# Patient Record
Sex: Female | Born: 1985 | Race: Black or African American | Hispanic: No | Marital: Single | Smoking: Never smoker
Health system: Southern US, Community
[De-identification: ages and names within clinical notes are randomized; demographics above are authoritative.]

## PROBLEM LIST (undated history)

## (undated) ENCOUNTER — Inpatient Hospital Stay (HOSPITAL_COMMUNITY): Payer: Self-pay

## (undated) DIAGNOSIS — Z8619 Personal history of other infectious and parasitic diseases: Secondary | ICD-10-CM

## (undated) DIAGNOSIS — I456 Pre-excitation syndrome: Secondary | ICD-10-CM

## (undated) DIAGNOSIS — D649 Anemia, unspecified: Secondary | ICD-10-CM

## (undated) DIAGNOSIS — N898 Other specified noninflammatory disorders of vagina: Secondary | ICD-10-CM

## (undated) DIAGNOSIS — R000 Tachycardia, unspecified: Secondary | ICD-10-CM

## (undated) DIAGNOSIS — T7840XA Allergy, unspecified, initial encounter: Secondary | ICD-10-CM

## (undated) DIAGNOSIS — F419 Anxiety disorder, unspecified: Secondary | ICD-10-CM

## (undated) DIAGNOSIS — I447 Left bundle-branch block, unspecified: Secondary | ICD-10-CM

## (undated) DIAGNOSIS — L309 Dermatitis, unspecified: Secondary | ICD-10-CM

## (undated) DIAGNOSIS — N760 Acute vaginitis: Secondary | ICD-10-CM

## (undated) DIAGNOSIS — K219 Gastro-esophageal reflux disease without esophagitis: Secondary | ICD-10-CM

## (undated) DIAGNOSIS — E119 Type 2 diabetes mellitus without complications: Secondary | ICD-10-CM

## (undated) DIAGNOSIS — R87629 Unspecified abnormal cytological findings in specimens from vagina: Secondary | ICD-10-CM

## (undated) DIAGNOSIS — E785 Hyperlipidemia, unspecified: Secondary | ICD-10-CM

## (undated) DIAGNOSIS — R51 Headache: Secondary | ICD-10-CM

## (undated) DIAGNOSIS — R0602 Shortness of breath: Secondary | ICD-10-CM

## (undated) DIAGNOSIS — B9689 Other specified bacterial agents as the cause of diseases classified elsewhere: Secondary | ICD-10-CM

## (undated) DIAGNOSIS — I499 Cardiac arrhythmia, unspecified: Secondary | ICD-10-CM

## (undated) DIAGNOSIS — R079 Chest pain, unspecified: Secondary | ICD-10-CM

## (undated) HISTORY — DX: Other specified noninflammatory disorders of vagina: N89.8

## (undated) HISTORY — DX: Personal history of other infectious and parasitic diseases: Z86.19

## (undated) HISTORY — DX: Gastro-esophageal reflux disease without esophagitis: K21.9

## (undated) HISTORY — DX: Acute vaginitis: N76.0

## (undated) HISTORY — DX: Dermatitis, unspecified: L30.9

## (undated) HISTORY — DX: Cardiac arrhythmia, unspecified: I49.9

## (undated) HISTORY — DX: Tachycardia, unspecified: R00.0

## (undated) HISTORY — DX: Type 2 diabetes mellitus without complications: E11.9

## (undated) HISTORY — DX: Anxiety disorder, unspecified: F41.9

## (undated) HISTORY — DX: Other specified bacterial agents as the cause of diseases classified elsewhere: B96.89

## (undated) HISTORY — DX: Pre-excitation syndrome: I45.6

## (undated) HISTORY — DX: Shortness of breath: R06.02

## (undated) HISTORY — DX: Unspecified abnormal cytological findings in specimens from vagina: R87.629

## (undated) HISTORY — DX: Allergy, unspecified, initial encounter: T78.40XA

## (undated) HISTORY — DX: Hyperlipidemia, unspecified: E78.5

## (undated) HISTORY — DX: Chest pain, unspecified: R07.9

## (undated) HISTORY — DX: Anemia, unspecified: D64.9

## (undated) HISTORY — DX: Left bundle-branch block, unspecified: I44.7

---

## 2005-05-03 ENCOUNTER — Emergency Department (HOSPITAL_COMMUNITY): Admission: EM | Admit: 2005-05-03 | Discharge: 2005-05-03 | Payer: Self-pay | Admitting: Emergency Medicine

## 2005-11-23 ENCOUNTER — Inpatient Hospital Stay (HOSPITAL_COMMUNITY): Admission: AD | Admit: 2005-11-23 | Discharge: 2005-11-26 | Payer: Self-pay | Admitting: Obstetrics and Gynecology

## 2007-08-05 ENCOUNTER — Other Ambulatory Visit: Admission: RE | Admit: 2007-08-05 | Discharge: 2007-08-05 | Payer: Self-pay | Admitting: Obstetrics and Gynecology

## 2007-11-12 ENCOUNTER — Emergency Department (HOSPITAL_COMMUNITY): Admission: EM | Admit: 2007-11-12 | Discharge: 2007-11-12 | Payer: Self-pay | Admitting: Emergency Medicine

## 2007-11-14 ENCOUNTER — Emergency Department (HOSPITAL_COMMUNITY): Admission: EM | Admit: 2007-11-14 | Discharge: 2007-11-14 | Payer: Self-pay | Admitting: Family Medicine

## 2007-11-25 ENCOUNTER — Inpatient Hospital Stay (HOSPITAL_COMMUNITY): Admission: AD | Admit: 2007-11-25 | Discharge: 2007-11-25 | Payer: Self-pay | Admitting: Obstetrics & Gynecology

## 2007-11-25 ENCOUNTER — Ambulatory Visit: Payer: Self-pay | Admitting: Family Medicine

## 2007-12-27 ENCOUNTER — Emergency Department (HOSPITAL_COMMUNITY): Admission: EM | Admit: 2007-12-27 | Discharge: 2007-12-27 | Payer: Self-pay | Admitting: Emergency Medicine

## 2008-01-15 ENCOUNTER — Inpatient Hospital Stay (HOSPITAL_COMMUNITY): Admission: AD | Admit: 2008-01-15 | Discharge: 2008-01-15 | Payer: Self-pay | Admitting: Obstetrics & Gynecology

## 2008-01-15 ENCOUNTER — Ambulatory Visit: Payer: Self-pay | Admitting: Obstetrics & Gynecology

## 2008-01-26 ENCOUNTER — Inpatient Hospital Stay (HOSPITAL_COMMUNITY): Admission: AD | Admit: 2008-01-26 | Discharge: 2008-01-29 | Payer: Self-pay | Admitting: Obstetrics and Gynecology

## 2008-03-10 ENCOUNTER — Other Ambulatory Visit: Admission: RE | Admit: 2008-03-10 | Discharge: 2008-03-10 | Payer: Self-pay | Admitting: Obstetrics & Gynecology

## 2010-09-24 NOTE — H&P (Signed)
NAMEALOISE, COPUS NO.:  [id number]   MEDICAL RECORD NO.:  [id number]          PATIENT TYPE:  INP   LOCATION:  9167                          FACILITY:  [facility]   PHYSICIAN:  Tilda Burrow, M.D. DATE OF BIRTH:  [birth year]   DATE OF ADMISSION:  01/26/2008  DATE OF DISCHARGE:                              HISTORY & PHYSICAL   ADMITTING DIAGNOSIS:  Pregnancy, 41 weeks 1 day, postdate pregnancy,  medically indicated induction of labor due to postdate cervical  variability.   HISTORY OF PRESENT ILLNESS:  This is a 25 year old female, gravida 2,  para 1-0-0-1, last menstrual period April 12, 2007, plus [facility] January 18, 2008, with 19-week ultrasound suggesting [facility] of January 11, 2008,  and 12-week ultrasound suggesting January 22, 2008.  She was admitted  at 41-1 by the best criteria for induction of labor.  Cervix was soft,  dilated now 3 cm, though it remains posterior, soft, -2 with vertex  presentation.  She has a the prior history of induction with 10-1/2-hour  labor.  Prenatal course has been followed at [facility] and is  notable for blood type AB positive, antibody screen negative, rubella  immune.   Hemoglobin 11, hematocrit 37.  Hepatitis C, HIV, RPR, GC, and Chlamydia  all negative.   Pap smear showed low-grade abnormalities, will be evaluated in 4 weeks.  MSAFP normal, group B strep negative, hemoglobin 31% at 28 weeks and  glucose tolerance test 92 mg percent, and Sickledex is negative.  She  plans on post partum tubal ligation 4 weeks postpartum.  Plans to bottle-  feed.  Takes her baby to [facility].   PHYSICAL EXAMINATION:  VITAL SIGNS:  Height 5 feet 6 inches, weight 170,  blood pressure 110/64.  HEENT:  Pupils equal, round, reactive to light.  NECK:  Supple.  GENERAL:  Alert and oriented x3.   Fundal length 30 cm down from 1 cm from the last week.  Cervix was 3-cm  long, -2 vertex, soft.   Consider favorable  plan.  Pitocin induction with labor with the  amniotomy once labor develops.  Scheduled for 9 p.m. tonight.  We will  move up to 5 p.m. if staffing and labor and delivery volume allows.      Tilda Burrow, M.D.  Electronically Signed    JVF/MEDQ  D:  01/26/2008  T:  01/27/2008  Job:  [id number]   cc:   [facility]   Francoise Schaumann. Milford Cage DO, FAAP  Fax: [phone]

## 2010-09-24 NOTE — Op Note (Signed)
NAMELELIA, JONS NO.:  [id number]   MEDICAL RECORD NO.:  [id number]          PATIENT TYPE:  INP   LOCATION:  9111                          FACILITY:  [facility]   PHYSICIAN:  Tilda Burrow, M.D. DATE OF BIRTH:  1985/12/21   DATE OF PROCEDURE:  DATE OF DISCHARGE:                               OPERATIVE REPORT   DELIVERY TIME:  4:56 a.m. on January 27, 2008.   SUMMARY:  Ms. Bamburg was admitted for induction at 9:45, with cervix 3  cm, 50% -2 vertex soft posterior.  Pitocin was initiated.  Amniotomy was  performed at midnight.  She progressed nicely in response to labor,  received epidural for analgesia and reached completely dilated shortly  before 5 a.m.  A brief second stage resulted in a spontaneous vertex  vaginal delivery from an OA position of a healthy female infant, Apgars  of 8 and 9, weight 7 pounds 9 ounces.  Placenta, cord blood, samples  were obtained, and placenta delivered intact.  Tomasa Blase presentation with  minimal blood loss of 200 mL.  Perineum was intact.  The patient  recovered in stable condition.  Delivery was attended by the patient's  mother and previous son stayed in the room.  The patient went to  recovery in stable condition with epidural removed and stable postpartum  status.      Tilda Burrow, M.D.  Electronically Signed     JVF/MEDQ  D:  01/27/2008  T:  01/27/2008  Job:  [id number]   cc:   Francoise Schaumann. Milford Cage DO, FAAP  Fax: [phone]

## 2010-09-27 NOTE — Op Note (Signed)
Candace [name], KINNAIRD NO.:  [id number]   MEDICAL RECORD NO.:  [id number]          PATIENT TYPE:  INP   LOCATION:  LDR1                          FACILITY:  [facility]   PHYSICIAN:  Tilda Burrow, M.D. DATE OF BIRTH:  [birth year]   DATE OF PROCEDURE:  11/24/2005  DATE OF DISCHARGE:                                  PROCEDURE NOTE   LENGTH OF FIRST STAGE OF LABOR:  7 hours and 10 minutes.   LENGTH OF FIRST STAGE OF LABOR:  1 hour and 28 minutes.   LENGTH OF THIRD STAGE OF LABOR:  4 minutes.   DELIVERY NOTE:  Date of delivery November 24, 2005 at 1738.  [name] had a  normal spontaneous vaginal delivery of a viable female infant with Apgars of  9/9.  Upon delivery of the head, there was a nuchal cord noted, which was  easily loosened, and the infant slipped through without any difficulty.  The  infant was suctioned.  Cord clamped and cut and to the mother's abdomen for  newborn care.  Upon inspection, the perineum was noted to be intact.  Cord  blood and cord blood gas as obtained.  Third stage of labor was actively  managed with 20 units of Pitocin, 1000 cc of D-5 lactated Ringers at a rapid  rate.  Placenta was delivered spontaneously via Tomasa Blase mechanism.  Membranes are noted to be intact.  There was a three vessel cord noted upon  inspection.  Estimated blood loss was approximately 350 cc.  Epidural  catheter was removed with flow tip intact.  The infant and mother were both  stabilized and transferred up to the postpartum unit in stable condition.      Zerita Boers, Lanier Clam      Tilda Burrow, M.D.  Electronically Signed    DL/MEDQ  D:  16/02/9603  T:  11/24/2005  Job:  [id number]   cc:   [facility]   Jeoffrey Massed, MD  Fax: [phone]

## 2010-09-27 NOTE — Op Note (Signed)
Candace [name], Candace [name] NO.:  [id number]   MEDICAL RECORD NO.:  [id number]          PATIENT TYPE:  INP   LOCATION:  LDR1                          FACILITY:  [facility]   PHYSICIAN:  Tilda Burrow, M.D. DATE OF BIRTH:  [birth year]   DATE OF PROCEDURE:  11/24/2005  DATE OF DISCHARGE:                                 OPERATIVE REPORT   PREOPERATIVE DIAGNOSES:  Epidural catheter placement.   POSTOPERATIVE DIAGNOSES:  Not given.   DESCRIPTION OF PROCEDURE:  The patient was prepped and draped in the sitting  position. The back was technically challenging to palpate sufficiently to  identify the epidural spaces due to very tight bony prominences with lots of  fatty tissue with edema overlying it. Nonetheless, we were able to at L2-3  interspace identify the interspace on the second attempt and at a depth of 8  cm were able to identify the epidural space using loss of resistance  technique. 5 mL of 1.5% lidocaine with epinephrine was infused followed by  insertion of the epidural catheter 2 cm into the epidural space with removal  of the Tuohy needle, taping of the catheter to the back and infusion at 14  mL/hour of 0.125% Marcaine solution with fentanyl. The patient tolerated the  procedure well and had good analgesic effect developed. Cervical exam post  procedure shows the cervix to be stretchable to 5 cm dilated.      Tilda Burrow, M.D.  Electronically Signed     JVF/MEDQ  D:  11/24/2005  T:  11/25/2005  Job:  [id number]

## 2010-09-27 NOTE — H&P (Signed)
[name], [name] NO.:  [id number]   MEDICAL RECORD NO.:  [id number]          PATIENT TYPE:  INP   LOCATION:  LDR1                          FACILITY:  [facility]   PHYSICIAN:  Tilda Burrow, M.D. DATE OF BIRTH:  20-Jan-1986   DATE OF ADMISSION:  11/23/2005  DATE OF DISCHARGE:  LH                                HISTORY & PHYSICAL   Patient is going to be admitted to the birthing center on the evening of  July 15 for induction of labor.   CHIEF COMPLAINT:  Induction of labor secondary to severe lower extremity  swelling.   HISTORY OF PRESENT ILLNESS:  Candace [name] is a 25 year old gravida 1, para 0  with an EDC of November 22, 2005 based on first trimester ultrasound.  Her  irregular period date was November 03, 2005, and her second trimester ultrasound  was July 9.  So, she is at least [redacted] weeks gestation.  She began prenatal  care in her second trimester and has had regular visits since then.   Prenatal labs include blood type AB+.  Rubella is immune.  HB, SAG, HIV,  RVR, and group B strep are negative.  She did have a positive Chlamydia on  October 17, 2005 with a subsequent negative proof of cure.  She was also  positive for HPV with an LSIL Pap smear.  She had a culpo done, and we are  going to recheck it three months after delivery.  Her sickle cell screen was  negative.  Her MSAFP was also negative.   PAST MEDICAL HISTORY:  Positive for anemia.   No surgical history.   No known drug allergies.   SOCIAL HISTORY:  She is single.  Lives with her mother.  She is in [employer].  She is estranged from the father of the baby.  She denies cigarette smoking,  alcohol, or drug use.   FAMILY HISTORY:  Positive for hypertension, diabetes, and cancer.   PHYSICAL EXAMINATION:  VITAL SIGNS:  Blood pressures have been 90s-130s/50s-  80s.  Total weight gain started off, she actually had a loss of 2 pounds up  until she was 35 weeks, where she weighed 166.  Then she lost 7 more  pounds  over the next couple of weeks and got down to 151; however, in the past two  weeks, she has actually gained 21 pounds.  Her fundal height growth has been  normal.  EXTREMITIES:  She has 3-4+ pitting edema all the way up past her knees.  This has just boomed in the past two weeks.  She is extremely uncomfortable.  There is no facial swelling or swelling in the upper extremities.  Reflexes  are 2+.  PELVIC:  Her cervix is 2 cm, 50% effaced, -2 station.  HEENT:  Within normal limits.  HEART:  Regular rate and rhythm.  LUNGS:  Clear.  ABDOMEN:  Soft and nontender.  Fundal height is 39 cm.   IMPRESSION:  Intrauterine pregnancy at 40 weeks.  Induction of labor  secondary to extreme swelling and maternal discomfort.   PLAN:  We will  admit to labor and delivery on the evening of Sunday, July  15th.  We will plan a Foley bulb preinduction and AROM and Pitocin in the  morning.      Jacklyn Shell, C.N.M.      Tilda Burrow, M.D.  Electronically Signed    FC/MEDQ  D:  11/21/2005  T:  11/21/2005  Job:  [id number]

## 2011-02-07 LAB — URINALYSIS, ROUTINE W REFLEX MICROSCOPIC
Glucose, UA: NEGATIVE
Ketones, ur: 15 — AB
Nitrite: NEGATIVE
Protein, ur: 30 — AB
Urobilinogen, UA: 0.2

## 2011-02-07 LAB — URINE MICROSCOPIC-ADD ON

## 2011-02-10 LAB — CBC
HCT: 28.8 — ABNORMAL LOW
Hemoglobin: 9.4 — ABNORMAL LOW
MCHC: 32.4
MCHC: 32.5
MCV: 76.8 — ABNORMAL LOW
MCV: 77.3 — ABNORMAL LOW
Platelets: 297
RDW: 16.2 — ABNORMAL HIGH
RDW: 16.4 — ABNORMAL HIGH

## 2011-07-25 LAB — LIPID PANEL
Cholesterol: 231 mg/dL — AB (ref 0–200)
HDL: 85 mg/dL — AB (ref 35–70)
LDL Cholesterol: 8 mg/dL
Triglycerides: 41 mg/dL (ref 40–160)

## 2011-09-10 ENCOUNTER — Ambulatory Visit (INDEPENDENT_AMBULATORY_CARE_PROVIDER_SITE_OTHER): Payer: BC Managed Care – PPO | Admitting: Family Medicine

## 2011-09-10 ENCOUNTER — Encounter: Payer: Self-pay | Admitting: Family Medicine

## 2011-09-10 VITALS — BP 110/80 | HR 81 | Resp 16 | Ht 62.25 in | Wt 184.0 lb

## 2011-09-10 DIAGNOSIS — L259 Unspecified contact dermatitis, unspecified cause: Secondary | ICD-10-CM

## 2011-09-10 DIAGNOSIS — E669 Obesity, unspecified: Secondary | ICD-10-CM

## 2011-09-10 DIAGNOSIS — L309 Dermatitis, unspecified: Secondary | ICD-10-CM

## 2011-09-10 DIAGNOSIS — J309 Allergic rhinitis, unspecified: Secondary | ICD-10-CM

## 2011-09-10 DIAGNOSIS — D509 Iron deficiency anemia, unspecified: Secondary | ICD-10-CM

## 2011-09-10 DIAGNOSIS — R21 Rash and other nonspecific skin eruption: Secondary | ICD-10-CM | POA: Insufficient documentation

## 2011-09-10 DIAGNOSIS — K5900 Constipation, unspecified: Secondary | ICD-10-CM | POA: Insufficient documentation

## 2011-09-10 DIAGNOSIS — J302 Other seasonal allergic rhinitis: Secondary | ICD-10-CM

## 2011-09-10 DIAGNOSIS — R259 Unspecified abnormal involuntary movements: Secondary | ICD-10-CM | POA: Insufficient documentation

## 2011-09-10 LAB — CBC WITH DIFFERENTIAL/PLATELET
Basophils Relative: 0 % (ref 0–1)
HCT: 37.2 % (ref 36.0–46.0)
Hemoglobin: 11.8 g/dL — ABNORMAL LOW (ref 12.0–15.0)
MCH: 26.9 pg (ref 26.0–34.0)
MCHC: 31.7 g/dL (ref 30.0–36.0)
Monocytes Absolute: 0.3 10*3/uL (ref 0.1–1.0)
Monocytes Relative: 7 % (ref 3–12)
Neutro Abs: 2.7 10*3/uL (ref 1.7–7.7)
WBC: 5 10*3/uL (ref 4.0–10.5)

## 2011-09-10 LAB — IRON AND TIBC: %SAT: 11 % — ABNORMAL LOW (ref 20–55)

## 2011-09-10 LAB — LDL CHOLESTEROL, DIRECT: Direct LDL: 112 mg/dL — ABNORMAL HIGH

## 2011-09-10 LAB — BASIC METABOLIC PANEL
BUN: 12 mg/dL (ref 6–23)
Glucose, Bld: 80 mg/dL (ref 70–99)
Potassium: 4.7 mEq/L (ref 3.5–5.3)

## 2011-09-10 MED ORDER — TRIAMCINOLONE ACETONIDE 0.5 % EX OINT: TOPICAL_OINTMENT | Freq: Two times a day (BID) | CUTANEOUS | Status: AC

## 2011-09-10 NOTE — Progress Notes (Signed)
  Subjective:    Patient ID: Candace [name], female    DOB: 24-May-1985, 26 y.o.   MRN: [id number]  HPI [phone]   -- CNA [facility] here to establish care. She has not had a primary care provider since she was an adolescent. She was previously followed by [facility] however has not been seen in 3 years. Pap smear overdue. She had health screening at her work and had a cholesterol panel done. The LDL was marked as 8 however I cannot confirm that this is only on screening sheet not the actual lab report.  Anemia-she states that she gets shaky in her hands right before she eats and she wanted to this because of her anemia also her fingertips or cold. She has not had her blood checked in some time but was severely anemic in the past and was on iron supplementation.  Rash-she has a history of eczema however has had a rash that comes and goes on the back of her hand since starting her  at [facility] for the past year. She uses over-the-counter hydrocortisone for the itch. She is asking for a Nitrol latex free gloves prescription that are working order. She sure that this is coming from the vinyl gloves that they have at work.  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, but has no visual problems  Review of Systems   GEN- denies fatigue, fever, weight loss,weakness, recent illness HEENT- denies eye drainage, change in vision, nasal discharge, CVS- denies chest pain, palpitations RESP- denies SOB, cough, wheeze ABD- denies N/V, +constipation, abd pain GU- denies dysuria, hematuria, dribbling, incontinence MSK- denies joint pain, muscle aches, injury Neuro- denies headache, dizziness, syncope, seizure activity, occ shaking in hands before eating      Objective:   Physical Exam GEN- NAD, alert and oriented x3, obese HEENT- PERRL, EOMI, non injected sclera, pink conjunctiva, MMM, oropharynx clear Neck- Supple, no thyrmegaly CVS- RRR, no murmur ABD-NABS,soft,  NT,ND RESP-CTAB EXT- No edema Pulses- Radial, DP- 2+ Neuro- no focal deficits Skin- hyperpigmented raised fine maculopaular rash on bialt hands, palms sparred, hyperpigementation over face on chin and corners or mouth and antecubital fossa       Assessment & Plan:

## 2011-09-10 NOTE — Assessment & Plan Note (Signed)
Her hand shaking before meals is very interesting, she does eat small snacks as can not eat breakfast before work because of nausea at 530am, ? Low blood sugar, fatigue, no focal deficits

## 2011-09-10 NOTE — Patient Instructions (Addendum)
Use the steroid cream for your hands  Get the labs done- we will call with results  Work on your weight- cut back on snack food and junk food Exercise 30 minutes 5 days a week  F/U in 4 weeks for Physical for PAP Smear

## 2011-09-10 NOTE — Assessment & Plan Note (Signed)
Check iron panel and CBC. ?

## 2011-09-10 NOTE — Assessment & Plan Note (Signed)
Pt has constipation on and off, she manages this with what she eats, no meds currently,advised fruits and veggies for fiber

## 2011-09-10 NOTE — Assessment & Plan Note (Signed)
OTC meds, well controlled

## 2011-09-10 NOTE — Assessment & Plan Note (Signed)
She does not appear to be concerned with her weight, she states when she was down to 145lbs she was too skinny. Advised regular exercise and change in diet

## 2011-10-17 ENCOUNTER — Ambulatory Visit (INDEPENDENT_AMBULATORY_CARE_PROVIDER_SITE_OTHER): Payer: BC Managed Care – PPO | Admitting: Family Medicine

## 2011-10-17 ENCOUNTER — Other Ambulatory Visit (HOSPITAL_COMMUNITY)
Admission: RE | Admit: 2011-10-17 | Discharge: 2011-10-17 | Disposition: A | Discharge status: Home / Self Care | Payer: BC Managed Care – PPO | Source: Ambulatory Visit | Attending: Family Medicine | Admitting: Family Medicine

## 2011-10-17 ENCOUNTER — Encounter: Payer: Self-pay | Admitting: Family Medicine

## 2011-10-17 VITALS — BP 102/70 | HR 72 | Resp 16 | Ht 62.5 in | Wt 175.0 lb

## 2011-10-17 DIAGNOSIS — Z01419 Encounter for gynecological examination (general) (routine) without abnormal findings: Secondary | ICD-10-CM | POA: Insufficient documentation

## 2011-10-17 DIAGNOSIS — Z23 Encounter for immunization: Secondary | ICD-10-CM

## 2011-10-17 DIAGNOSIS — Z0000 Encounter for general adult medical examination without abnormal findings: Secondary | ICD-10-CM

## 2011-10-17 DIAGNOSIS — E669 Obesity, unspecified: Secondary | ICD-10-CM

## 2011-10-17 DIAGNOSIS — E785 Hyperlipidemia, unspecified: Secondary | ICD-10-CM | POA: Insufficient documentation

## 2011-10-17 NOTE — Progress Notes (Signed)
Addended by: Kandis Fantasia B on: 10/17/2011 08:49 AM   Modules accepted: Orders

## 2011-10-17 NOTE — Patient Instructions (Signed)
Dr.Civil- [phone] (Dentist) I recommend dental visit every 6 months I recommend eye visit yearly Limit fast food and fried foods, increase activity 30 minutes 5 days a week We will recheck your cholesterol in 1 year Letter to be sent with PAP Smear results TDAP given I recommend Multivitamin with iron F/U 1 year for physical, otherwise as needed

## 2011-10-17 NOTE — Progress Notes (Signed)
  Subjective:    Patient ID: Candace [name], female    DOB: 08-22-1985, 26 y.o.   MRN: [id number]  HPI  Pt here for GYN exam No concerns Due for TDAP Labs reviewed with pt LMP- 1 week ago, no contraception  Review of Systems  GEN- denies fatigue, fever, weight loss,weakness, recent illness HEENT- denies eye drainage, change in vision, nasal discharge, CVS- denies chest pain, palpitations RESP- denies SOB, cough, wheeze ABD- denies N/V, change in stools, abd pain GU- denies dysuria, hematuria, dribbling, incontinence MSK- denies joint pain, muscle aches, injury Neuro- denies headache, dizziness, syncope, seizure activity    Objective:   Physical Exam GEN- NAD, alert and oriented x3, obese HEENT- PERRL, EOMI, non injected sclera, pink conjunctiva, MMM, oropharynx clear Neck- Supple, no thyromegaly CVS- RRR, no murmur RESP-CTAB ABD-NABS,soft, NT,ND Breast- normal symmetry, no nipple inversion,no nipple drainage, no nodules or lumps felt Nodes- no axillary nodes GU- normal external genitalia, vaginal mucosa pink and moist, cervix visualized no growth, no blood form os, minimal thin clear discharge, no CMT, no ovarian masses, uterus normal size EXT- No edema Pulses- Radial, DP- 2+        Assessment & Plan:   CPE- PAP Smear done, passed vision screen, TDAP given, labs discussed  Obesity- discussed avoiding fast foods, fried foods, increase activity

## 2011-10-17 NOTE — Assessment & Plan Note (Signed)
Will monitor, discussed diet, no meds needed

## 2011-10-23 MED ORDER — FLUCONAZOLE 150 MG PO TABS: 150.0000 mg | ORAL_TABLET | Freq: Once | ORAL | Status: AC

## 2011-10-23 NOTE — Addendum Note (Signed)
Addended by: Milinda Antis F on: 10/23/2011 01:21 PM   Modules accepted: Orders

## 2011-10-23 NOTE — Progress Notes (Signed)
  Subjective:    Patient ID: Candace [name], female    DOB: 1985-12-29, 26 y.o.   MRN: [id number]  HPI    Review of Systems     Objective:   Physical Exam        Assessment & Plan:  PAP Smear normal, yeast seen will send diflucan

## 2012-04-05 ENCOUNTER — Encounter: Payer: Self-pay | Admitting: Family Medicine

## 2012-04-05 ENCOUNTER — Telehealth: Payer: Self-pay | Admitting: Family Medicine

## 2012-04-05 ENCOUNTER — Ambulatory Visit (INDEPENDENT_AMBULATORY_CARE_PROVIDER_SITE_OTHER): Payer: BC Managed Care – PPO | Admitting: Family Medicine

## 2012-04-05 VITALS — BP 120/80 | HR 62 | Resp 15 | Ht 62.5 in | Wt 169.0 lb

## 2012-04-05 DIAGNOSIS — R634 Abnormal weight loss: Secondary | ICD-10-CM

## 2012-04-05 DIAGNOSIS — R259 Unspecified abnormal involuntary movements: Secondary | ICD-10-CM

## 2012-04-05 DIAGNOSIS — R7309 Other abnormal glucose: Secondary | ICD-10-CM

## 2012-04-05 DIAGNOSIS — E162 Hypoglycemia, unspecified: Secondary | ICD-10-CM | POA: Insufficient documentation

## 2012-04-05 LAB — HEPATIC FUNCTION PANEL
Albumin: 4.8 g/dL (ref 3.5–5.2)
Total Bilirubin: 1.2 mg/dL (ref 0.3–1.2)
Total Protein: 6.9 g/dL (ref 6.0–8.3)

## 2012-04-05 LAB — BASIC METABOLIC PANEL
BUN: 10 mg/dL (ref 6–23)
Creat: 0.77 mg/dL (ref 0.50–1.10)

## 2012-04-05 LAB — CBC
MCH: 27.6 pg (ref 26.0–34.0)
MCHC: 33.2 g/dL (ref 30.0–36.0)
Platelets: 326 10*3/uL (ref 150–400)
RDW: 14.1 % (ref 11.5–15.5)

## 2012-04-05 LAB — GLUCOSE, POCT (MANUAL RESULT ENTRY): POC Glucose: 77 mg/dl (ref 70–99)

## 2012-04-05 LAB — HEMOGLOBIN A1C: Hgb A1c MFr Bld: 5.6 % (ref <5.7)

## 2012-04-05 LAB — HIV ANTIBODY (ROUTINE TESTING W REFLEX): HIV: NONREACTIVE

## 2012-04-05 NOTE — Telephone Encounter (Signed)
Patient states that when she got home she still felt like she was having tremors and checked her blood sugar it was 104 fasting after being 77 in the office fasting.  Would like to know is this normal and should she be concerned.

## 2012-04-05 NOTE — Assessment & Plan Note (Signed)
She's lost approximately 30 pounds in the past 5 months initially she was exercising however the past 2 months she has not been exercising continues to wear stressed the canal to wait. Start workup per above. Her Pap smear was normal. Labs to be done. She is a nonsmoker.  Check thyroid

## 2012-04-05 NOTE — Progress Notes (Signed)
  Subjective:    Patient ID: Candace [name], female    DOB: 07/21/1985, 26 y.o.   MRN: [id number]  HPI Patient presents with tremors for the past few weeks which have increased in nature. She knows that she has a tremor when she has low blood sugars are she has not eaten. After she eats a tremor resolves itself. She was evaluated for this in the past however nothing was found at that time it was short-lived. Now she is currently daily and is noted at her work. She feels weak when this occurs. She has not had any syncope or seizure activity. She has a family history of diabetes mellitus. No current medication. Denies etoh use. She states she has lost  30lb over the past 5 months, initially trying to loose with exercise past 2 months weight continues to come off with no exercise or change in diet She has not had any food this AM  Review of Systems - per above   GEN- denies fatigue, fever, weight loss,weakness, recent illness HEENT- denies eye drainage, change in vision, nasal discharge, CVS- denies chest pain, palpitations RESP- denies SOB, cough, wheeze ABD- denies N/V, change in stools, abd pain GU- denies dysuria, hematuria, dribbling, incontinence MSK- denies joint pain, muscle aches, injury Neuro- denies headache, dizziness, syncope, seizure activity, + tremor     Objective:   Physical Exam GEN- NAD, alert and oriented x3 HEENT- PERRL, EOMI, non injected sclera, pink conjunctiva, MMM, oropharynx clear Neck- Supple, no thryomegaly CVS- RRR, no murmur RESP-CTAB EXT- No edema Pulses- Radial, DP- 2+ Neuro- CNII-XII grossly in tact, + tremor with outstreched hands, DTR symmetric UE/LE  Psych- normal affect and mood      Assessment & Plan:

## 2012-04-05 NOTE — Assessment & Plan Note (Signed)
She's had recurrence of symptoms. Labs will be obtained. She's to keep small snack with her at all times. If these things are negative and I will have her see neurology.

## 2012-04-05 NOTE — Addendum Note (Signed)
Addended by: Milinda Antis F on: 04/05/2012 10:51 PM   Modules accepted: Orders

## 2012-04-05 NOTE — Patient Instructions (Signed)
Get the labs done this morning Keep glucose tabs with you or small snacks Take your blood sugar fasting and then when you have symptoms  You may need referral to neurologist

## 2012-04-05 NOTE — Assessment & Plan Note (Addendum)
Her symptoms are concerning for hypoglycemia and she improves after eating or drinking. I will send her for electrolytes will also check her TSH with a tremor. I will also check an A1c Given meter in office. We'll see what her blood sugars are during these episodes. She would check these for the next 2 weeks

## 2012-04-05 NOTE — Telephone Encounter (Signed)
Patient aware.

## 2012-04-05 NOTE — Telephone Encounter (Signed)
This is okay, make sure she eats at regular intervals, We will call back with the labs

## 2012-04-27 ENCOUNTER — Telehealth: Payer: Self-pay | Admitting: Family Medicine

## 2012-04-28 NOTE — Telephone Encounter (Signed)
Patient is aware 

## 2012-09-21 ENCOUNTER — Ambulatory Visit (INDEPENDENT_AMBULATORY_CARE_PROVIDER_SITE_OTHER): Payer: BC Managed Care – PPO | Admitting: Family Medicine

## 2012-09-21 ENCOUNTER — Other Ambulatory Visit: Payer: Self-pay | Admitting: Obstetrics & Gynecology

## 2012-09-21 ENCOUNTER — Encounter: Payer: Self-pay | Admitting: Family Medicine

## 2012-09-21 VITALS — BP 120/80 | HR 114 | Resp 16 | Wt 196.8 lb

## 2012-09-21 DIAGNOSIS — Z3201 Encounter for pregnancy test, result positive: Secondary | ICD-10-CM

## 2012-09-21 DIAGNOSIS — O3680X0 Pregnancy with inconclusive fetal viability, not applicable or unspecified: Secondary | ICD-10-CM

## 2012-09-21 DIAGNOSIS — N912 Amenorrhea, unspecified: Secondary | ICD-10-CM

## 2012-09-21 NOTE — Patient Instructions (Signed)
Start Flinestone Vitamin - take 2 of the complete vitamins Referral to Dr. Emelda Fear

## 2012-09-22 ENCOUNTER — Encounter: Payer: Self-pay | Admitting: *Deleted

## 2012-09-22 ENCOUNTER — Encounter: Payer: Self-pay | Admitting: Family Medicine

## 2012-09-22 DIAGNOSIS — Z3201 Encounter for pregnancy test, result positive: Secondary | ICD-10-CM | POA: Insufficient documentation

## 2012-09-22 LAB — HCG, QUANTITATIVE, PREGNANCY: hCG, Beta Chain, Quant, S: 7684.1 m[IU]/mL

## 2012-09-22 NOTE — Progress Notes (Signed)
  Subjective:    Patient ID: Candace [name], female    DOB: Sep 18, 1985, 27 y.o.   MRN: [id number]  HPI  Pt here with positive pregnancy test a few weeks, ago, repeated and was negative. LMP 1st week of April, has gained weight, very hungry. She has this before where she had different pregnancy test and was 4 months pregnant.  Review of Systems  GEN- denies fatigue, fever, weight loss,weakness, recent illness HEENT- denies eye drainage, change in vision, nasal discharge, CVS- denies chest pain, palpitations RESP- denies SOB, cough, wheeze ABD- denies N/V, change in stools, abd pain GU- denies dysuria, hematuria, dribbling, incontinence MSK- denies joint pain, muscle aches, injury Neuro- denies headache, dizziness, syncope, seizure activity      Objective:   Physical Exam GEN- NAD, alert and oriented x3 CVS- mild tachycardia, no murmur RESP-CTAB ABD-NABS,soft,NT,ND EXT- No edema Pulses- Radial 2+        Assessment & Plan:

## 2012-09-22 NOTE — Assessment & Plan Note (Signed)
Beta HCG conforms Start flinestones, unable to tolerate prenatal vitamins in the past Referral to [facility],

## 2012-09-23 ENCOUNTER — Ambulatory Visit (INDEPENDENT_AMBULATORY_CARE_PROVIDER_SITE_OTHER): Payer: BC Managed Care – PPO

## 2012-09-23 DIAGNOSIS — O3680X0 Pregnancy with inconclusive fetal viability, not applicable or unspecified: Secondary | ICD-10-CM

## 2012-09-23 NOTE — Progress Notes (Signed)
U/S-transvaginal u/s performed-single intrauterine gestational sac noted, GS meas c/w 5+5wks, no yolk sac or fetal pole noted,cx appears closed, C.L. Noted on RT, no free fluid noted

## 2012-09-24 ENCOUNTER — Other Ambulatory Visit: Payer: Self-pay | Admitting: Obstetrics & Gynecology

## 2012-09-24 DIAGNOSIS — O3680X0 Pregnancy with inconclusive fetal viability, not applicable or unspecified: Secondary | ICD-10-CM

## 2012-09-28 LAB — US OB TRANSVAGINAL

## 2012-10-05 ENCOUNTER — Ambulatory Visit (INDEPENDENT_AMBULATORY_CARE_PROVIDER_SITE_OTHER): Payer: BC Managed Care – PPO

## 2012-10-05 ENCOUNTER — Other Ambulatory Visit: Payer: Self-pay | Admitting: Obstetrics & Gynecology

## 2012-10-05 ENCOUNTER — Encounter: Payer: Self-pay | Admitting: Obstetrics & Gynecology

## 2012-10-05 ENCOUNTER — Ambulatory Visit (INDEPENDENT_AMBULATORY_CARE_PROVIDER_SITE_OTHER): Payer: BC Managed Care – PPO | Admitting: Obstetrics & Gynecology

## 2012-10-05 VITALS — BP 110/70 | Wt 196.0 lb

## 2012-10-05 DIAGNOSIS — Z331 Pregnant state, incidental: Secondary | ICD-10-CM

## 2012-10-05 DIAGNOSIS — O3680X0 Pregnancy with inconclusive fetal viability, not applicable or unspecified: Secondary | ICD-10-CM

## 2012-10-05 DIAGNOSIS — O210 Mild hyperemesis gravidarum: Secondary | ICD-10-CM

## 2012-10-05 DIAGNOSIS — Z1389 Encounter for screening for other disorder: Secondary | ICD-10-CM

## 2012-10-05 LAB — POCT URINALYSIS DIPSTICK
Glucose, UA: NEGATIVE
Leukocytes, UA: NEGATIVE
Nitrite, UA: NEGATIVE

## 2012-10-05 NOTE — Progress Notes (Signed)
U/S-transabdominal u/s performed, single IUP with +FCA noted, FHR=147BPM, cx long and closed, bilateral adnexa wnl, EDD 05/25/2013

## 2012-10-05 NOTE — Progress Notes (Signed)
Sonogram reviewed No bleeding, nauseated but not vomiting First ob appointment

## 2012-10-06 LAB — US OB COMP LESS 14 WKS

## 2012-10-07 ENCOUNTER — Telehealth: Payer: Self-pay | Admitting: Advanced Practice Midwife

## 2012-10-07 NOTE — Telephone Encounter (Signed)
Pt requesting Zofran or Diclegis prescribed for N/V. Pt states does not want phenergan makes her sleepy.

## 2012-10-08 MED ORDER — ONDANSETRON HCL 8 MG PO TABS: 8.0000 mg | ORAL_TABLET | Freq: Two times a day (BID) | ORAL | Status: DC | PRN

## 2012-10-08 NOTE — Telephone Encounter (Signed)
Left message i called zofran in

## 2012-10-19 ENCOUNTER — Ambulatory Visit (INDEPENDENT_AMBULATORY_CARE_PROVIDER_SITE_OTHER): Payer: BC Managed Care – PPO | Admitting: Women's Health

## 2012-10-19 ENCOUNTER — Encounter: Payer: Self-pay | Admitting: Women's Health

## 2012-10-19 ENCOUNTER — Other Ambulatory Visit: Payer: Self-pay | Admitting: *Deleted

## 2012-10-19 VITALS — BP 120/80 | Wt 188.6 lb

## 2012-10-19 DIAGNOSIS — O210 Mild hyperemesis gravidarum: Secondary | ICD-10-CM

## 2012-10-19 DIAGNOSIS — Z3481 Encounter for supervision of other normal pregnancy, first trimester: Secondary | ICD-10-CM

## 2012-10-19 DIAGNOSIS — Z1389 Encounter for screening for other disorder: Secondary | ICD-10-CM

## 2012-10-19 DIAGNOSIS — O219 Vomiting of pregnancy, unspecified: Secondary | ICD-10-CM

## 2012-10-19 DIAGNOSIS — Z3480 Encounter for supervision of other normal pregnancy, unspecified trimester: Secondary | ICD-10-CM | POA: Insufficient documentation

## 2012-10-19 DIAGNOSIS — Z331 Pregnant state, incidental: Secondary | ICD-10-CM

## 2012-10-19 LAB — URINALYSIS
Glucose, UA: 100 mg/dL — AB
Hgb urine dipstick: NEGATIVE
Ketones, ur: 15 mg/dL — AB
Protein, ur: NEGATIVE mg/dL
Urobilinogen, UA: 0.2 mg/dL (ref 0.0–1.0)

## 2012-10-19 LAB — POCT URINALYSIS DIPSTICK
Blood, UA: NEGATIVE
Glucose, UA: NEGATIVE
Nitrite, UA: NEGATIVE

## 2012-10-19 LAB — CBC
HCT: 36.7 % (ref 36.0–46.0)
Hemoglobin: 12.3 g/dL (ref 12.0–15.0)
MCH: 27.2 pg (ref 26.0–34.0)
MCHC: 33.5 g/dL (ref 30.0–36.0)
RBC: 4.52 MIL/uL (ref 3.87–5.11)

## 2012-10-19 LAB — HIV ANTIBODY (ROUTINE TESTING W REFLEX): HIV: NONREACTIVE

## 2012-10-19 MED ORDER — DOXYLAMINE-PYRIDOXINE 10-10 MG PO TBEC: 10.0000 mg | DELAYED_RELEASE_TABLET | ORAL | Status: DC

## 2012-10-19 NOTE — Progress Notes (Signed)
Cramping off and on. Constipated. New OB packet given. Consents signed. Pt had a pap smear last year. To sign release for [place] to get results.

## 2012-10-19 NOTE — Patient Instructions (Signed)
Constipation  Drink plenty of fluid, preferably water, throughout the day  Eat foods high in fiber such as fruits, vegetables, and grains  Exercise, such as walking, is a good way to keep your bowels regular  Drink warm fluids, especially warm prune juice, or decaf coffee  Eat a 1/2 cup of real oatmeal (not instant), 1/2 cup applesauce, and 1/2-1 cup warm prune juice every day  If needed, you may take Colace (docusate sodium) stool softener once or twice a day to help keep the stool soft. If you are pregnant, wait until you are out of your first trimester (12-14 weeks of pregnancy)  If you still are having problems with constipation, you may take Miralax once daily as needed to help keep your bowels regular.  If you are pregnant, wait until you are out of your first trimester (12-14 weeks of pregnancy)   Nausea & Vomiting  Have saltine crackers or pretzels by your bed and eat a few bites before you raise your head out of bed in the morning  Eat small frequent meals throughout the day instead of large meals  Drink plenty of fluids throughout the day to stay hydrated, just don't drink a lot of fluids with your meals.  This can make your stomach fill up faster making you feel sick  Do not brush your teeth right after you eat  Products with real ginger are good for nausea, like ginger ale and ginger hard candy Make sure it says made with real ginger!  Sucking on sour candy like lemon heads is also good for nausea  If your prenatal vitamins make you nauseated, take them at night so you will sleep through the nausea  If you feel like you need medicine for the nausea & vomiting please let us know  If you are unable to keep any fluids or food down please let us know    Pregnancy - First Trimester During sexual intercourse, millions of sperm go into the vagina. Only 1 sperm will penetrate and fertilize the female egg while it is in the Fallopian tube. One week later, the fertilized egg  implants into the wall of the uterus. An embryo begins to develop into a baby. At 6 to 8 weeks, the eyes and face are formed and the heartbeat can be seen on ultrasound. At the end of 12 weeks (first trimester), all the baby's organs are formed. Now that you are pregnant, you will want to do everything you can to have a healthy baby. Two of the most important things are to get good prenatal care and follow your caregiver's instructions. Prenatal care is all the medical care you receive before the baby's birth. It is given to prevent, find, and treat problems during the pregnancy and childbirth. PRENATAL EXAMS  During prenatal visits, your weight, blood pressure, and urine are checked. This is done to make sure you are healthy and progressing normally during the pregnancy.  A pregnant woman should gain 25 to 35 pounds during the pregnancy. However, if you are overweight or underweight, your caregiver will advise you regarding your weight.  Your caregiver will ask and answer questions for you.  Blood work, cervical cultures, other necessary tests, and a Pap test are done during your prenatal exams. These tests are done to check on your health and the probable health of your baby. Tests are strongly recommended and done for HIV with your permission. This is the virus that causes AIDS. These tests are done because medicines  can be given to help prevent your baby from being born with this infection should you have been infected without knowing it. Blood work is also used to find out your blood type, previous infections, and follow your blood levels (hemoglobin).  Low hemoglobin (anemia) is common during pregnancy. Iron and vitamins are given to help prevent this. Later in the pregnancy, blood tests for diabetes will be done along with any other tests if any problems develop.  You may need other tests to make sure you and the baby are doing well. CHANGES DURING THE FIRST TRIMESTER  Your body goes through  many changes during pregnancy. They vary from person to person. Talk to your caregiver about changes you notice and are concerned about. Changes can include:  Your menstrual period stops.  The egg and sperm carry the genes that determine what you look like. Genes from you and your partner are forming a baby. The female genes determine whether the baby is a boy or a girl.  Your body increases in girth and you may feel bloated.  Feeling sick to your stomach (nauseous) and throwing up (vomiting). If the vomiting is uncontrollable, call your caregiver.  Your breasts will begin to enlarge and become tender.  Your nipples may stick out more and become darker.  The need to urinate more. Painful urination may mean you have a bladder infection.  Tiring easily.  Loss of appetite.  Cravings for certain kinds of food.  At first, you may gain or lose a couple of pounds.  You may have changes in your emotions from day to day (excited to be pregnant or concerned something may go wrong with the pregnancy and baby).  You may have more vivid and strange dreams. HOME CARE INSTRUCTIONS   It is very important to avoid all smoking, alcohol and non-prescribed drugs during your pregnancy. These affect the formation and growth of the baby. Avoid chemicals while pregnant to ensure the delivery of a healthy infant.  Start your prenatal visits by the 12th week of pregnancy. They are usually scheduled monthly at first, then more often in the last 2 months before delivery. Keep your caregiver's appointments. Follow your caregiver's instructions regarding medicine use, blood and lab tests, exercise, and diet.  During pregnancy, you are providing food for you and your baby. Eat regular, well-balanced meals. Choose foods such as meat, fish, milk and other low fat dairy products, vegetables, fruits, and whole-grain breads and cereals. Your caregiver will tell you of the ideal weight gain.  You can help morning  sickness by keeping soda crackers at the bedside. Eat a couple before arising in the morning. You may want to use the crackers without salt on them.  Eating 4 to 5 small meals rather than 3 large meals a day also may help the nausea and vomiting.  Drinking liquids between meals instead of during meals also seems to help nausea and vomiting.  A physical sexual relationship may be continued throughout pregnancy if there are no other problems. Problems may be early (premature) leaking of amniotic fluid from the membranes, vaginal bleeding, or belly (abdominal) pain.  Exercise regularly if there are no restrictions. Check with your caregiver or physical therapist if you are unsure of the safety of some of your exercises. Greater weight gain will occur in the last 2 trimesters of pregnancy. Exercising will help:  Control your weight.  Keep you in shape.  Prepare you for labor and delivery.  Help you lose your pregnancy   weight after you deliver your baby.  Wear a good support or jogging bra for breast tenderness during pregnancy. This may help if worn during sleep too.  Ask when prenatal classes are available. Begin classes when they are offered.  Do not use hot tubs, steam rooms, or saunas.  Wear your seat belt when driving. This protects you and your baby if you are in an accident.  Avoid raw meat, uncooked cheese, cat litter boxes, and soil used by cats throughout the pregnancy. These carry germs that can cause birth defects in the baby.  The first trimester is a good time to visit your dentist for your dental health. Getting your teeth cleaned is okay. Use a softer toothbrush and brush gently during pregnancy.  Ask for help if you have financial, counseling, or nutritional needs during pregnancy. Your caregiver will be able to offer counseling for these needs as well as refer you for other special needs.  Do not take any medicines or herbs unless told by your caregiver.  Inform your  caregiver if there is any mental or physical domestic violence.  Make a list of emergency phone numbers of family, friends, hospital, and police and fire departments.  Write down your questions. Take them to your prenatal visit.  Do not douche.  Do not cross your legs.  If you have to stand for long periods of time, rotate you feet or take small steps in a circle.  You may have more vaginal secretions that may require a sanitary pad. Do not use tampons or scented sanitary pads. MEDICINES AND DRUG USE IN PREGNANCY  Take prenatal vitamins as directed. The vitamin should contain 1 milligram of folic acid. Keep all vitamins out of reach of children. Only a couple vitamins or tablets containing iron may be fatal to a baby or young child when ingested.  Avoid use of all medicines, including herbs, over-the-counter medicines, not prescribed or suggested by your caregiver. Only take over-the-counter or prescription medicines for pain, discomfort, or fever as directed by your caregiver. Do not use aspirin, ibuprofen, or naproxen unless directed by your caregiver.  Let your caregiver also know about herbs you may be using.  Alcohol is related to a number of birth defects. This includes fetal alcohol syndrome. All alcohol, in any form, should be avoided completely. Smoking will cause low birth rate and premature babies.  Street or illegal drugs are very harmful to the baby. They are absolutely forbidden. A baby born to an addicted mother will be addicted at birth. The baby will go through the same withdrawal an adult does.  Let your caregiver know about any medicines that you have to take and for what reason you take them. SEEK MEDICAL CARE IF:  You have any concerns or worries during your pregnancy. It is better to call with your questions if you feel they cannot wait, rather than worry about them. SEEK IMMEDIATE MEDICAL CARE IF:   An unexplained oral temperature above 102 F (38.9 C) develops,  or as your caregiver suggests.  You have leaking of fluid from the vagina (birth canal). If leaking membranes are suspected, take your temperature and inform your caregiver of this when you call.  There is vaginal spotting or bleeding. Notify your caregiver of the amount and how many pads are used.  You develop a bad smelling vaginal discharge with a change in the color.  You continue to feel sick to your stomach (nauseated) and have no relief from remedies suggested. You  vomit blood or coffee ground-like materials.  You lose more than 2 pounds of weight in 1 week.  You gain more than 2 pounds of weight in 1 week and you notice swelling of your face, hands, feet, or legs.  You gain 5 pounds or more in 1 week (even if you do not have swelling of your hands, face, legs, or feet).  You get exposed to German measles and have never had them.  You are exposed to fifth disease or chickenpox.  You develop belly (abdominal) pain. Round ligament discomfort is a common non-cancerous (benign) cause of abdominal pain in pregnancy. Your caregiver still must evaluate this.  You develop headache, fever, diarrhea, pain with urination, or shortness of breath.  You fall or are in a car accident or have any kind of trauma.  There is mental or physical violence in your home. Document Released: 04/22/2001 Document Revised: 01/21/2012 Document Reviewed: 10/24/2008 [employer] Patient Information 2014 [employer].  

## 2012-10-19 NOTE — Progress Notes (Signed)
  Subjective:    Candace [name] is a 27 y.o. G34P2002 African American female at [redacted]w[redacted]d by 6wk u/s, being seen today for her first obstetrical visit.  Her obstetrical history is significant for obesity and 2 uncomplicated term svd's.  Pregnancy history fully reviewed.   Patient reports daily n/v-zofran not helping, also constipation. Denies cramping, vb, urinary frequency, hesitancy, urgency, or dysuria.   Filed Vitals:   10/19/12 1045  BP: 120/80  Weight: 188 lb 9.6 oz (85.548 kg)    HISTORY: OB History   Grav Para Term Preterm Abortions TAB SAB Ect Mult Living   3 2 1             # Outc Date GA Lbr Len/2nd Wgt Sex Del Anes PTL Lv   1 PAR 7/07    M SVD EPI     2 TRM 9/09 [redacted]w[redacted]d   F SVD EPI     3 CUR              Past Medical History  Diagnosis Date  . Anemia   . Eczema   . Allergy   . History of chlamydia   . History of gonorrhea   . Irregular heartbeat    Past Surgical History  Procedure Laterality Date  . No past surgeries     Family History  Problem Relation Age of Onset  . Cancer Mother     Uterine?  . Cancer Maternal Aunt     Multiple with Breast Cancer  . Hypertension Other   . Diabetes Other      Exam   System:     Skin: normal coloration and turgor, no rashes    Neurologic: oriented, normal mood   Extremities: normal strength, tone, and muscle mass   HEENT PERRLA   Mouth/Teeth mucous membranes moist   Cardiovascular: regular rate and rhythm   Respiratory:  appears well, vitals normal, no respiratory distress, acyanotic, normal RR   Abdomen: soft, non-tender   FHR: 140 via informal transabdominal u/s Thin prep pap smear not obtained- pt states she had a normal pap last year w/ dr. Jeanice Lim   Assessment:    Pregnancy: G3P1000 Patient Active Problem List   Diagnosis Date Noted  . Supervision of other normal pregnancy 10/19/2012    Priority: High  . Hypoglycemia 04/05/2012  . Hyperlipidemia 10/17/2011  . Iron deficiency anemia 09/10/2011  .  Constipation 09/10/2011  . Eczema 09/10/2011  . Seasonal allergies 09/10/2011  . Obesity 09/10/2011  . Abnormal involuntary movements 09/10/2011      [redacted]w[redacted]d G3P2002  New OB visit N/V pregnancy Constipation    Plan:     Initial labs drawn Continue prenatal vitamins Problem list reviewed and updated Reviewed n/v relief measures and warning s/s to report Discussed constipation relief measures Genetic Screening discussed Integrated Screen: requested Cystic fibrosis screening discussed requested Ultrasound discussed; fetal survey: requested Follow up in 4 weeks for 1st IT/NT and visit Rx Diclegis, 2 samples given and prior auth faxed  Hannelore, Bova 10/19/2012 11:14 AM

## 2012-10-20 LAB — ABO AND RH: Rh Type: POSITIVE

## 2012-10-20 LAB — DRUG SCREEN, URINE, NO CONFIRMATION
Benzodiazepines.: NEGATIVE
Cocaine Metabolites: NEGATIVE
Creatinine,U: 467.7 mg/dL
Marijuana Metabolite: NEGATIVE
Opiate Screen, Urine: NEGATIVE
Phencyclidine (PCP): NEGATIVE
Propoxyphene: NEGATIVE

## 2012-10-20 LAB — URINE CULTURE: Colony Count: NO GROWTH

## 2012-10-20 LAB — ANTIBODY SCREEN: Antibody Screen: NEGATIVE

## 2012-10-20 LAB — RUBELLA SCREEN: Rubella: 2.99 Index — ABNORMAL HIGH (ref <0.90)

## 2012-10-21 LAB — CYSTIC FIBROSIS DIAGNOSTIC STUDY

## 2012-10-21 LAB — GC/CHLAMYDIA PROBE AMP
CT Probe RNA: POSITIVE — AB
GC Probe RNA: NEGATIVE

## 2012-10-23 ENCOUNTER — Encounter: Payer: Self-pay | Admitting: Women's Health

## 2012-10-23 ENCOUNTER — Telehealth: Payer: Self-pay | Admitting: Women's Health

## 2012-10-23 DIAGNOSIS — A749 Chlamydial infection, unspecified: Secondary | ICD-10-CM

## 2012-10-23 DIAGNOSIS — O98811 Other maternal infectious and parasitic diseases complicating pregnancy, first trimester: Secondary | ICD-10-CM | POA: Insufficient documentation

## 2012-10-23 MED ORDER — AZITHROMYCIN 500 MG PO TABS: 1000.0000 mg | ORAL_TABLET | Freq: Once | ORAL | Status: AC

## 2012-10-23 NOTE — Telephone Encounter (Signed)
Pt returned call, notified her of +chlamydia and rx for azithromycin 1gm po x 1 e-scribed to her pharmacy. No longer w/ her partner.  Ruthie, Berch  10/23/12 @ 10:08 AM

## 2012-10-23 NOTE — Telephone Encounter (Signed)
Called pt to notify of +Chlamydia and need for treatment, no answer and no voicemail Candace [name], Candace [name]  10/23/12 @ 9:41 AM

## 2012-10-25 ENCOUNTER — Encounter: Payer: Self-pay | Admitting: Women's Health

## 2012-10-27 ENCOUNTER — Encounter (HOSPITAL_COMMUNITY): Payer: Self-pay | Admitting: *Deleted

## 2012-10-27 ENCOUNTER — Inpatient Hospital Stay (HOSPITAL_COMMUNITY)
Admission: AD | Admit: 2012-10-27 | Discharge: 2012-10-27 | Disposition: A | Discharge status: Home / Self Care | Payer: BC Managed Care – PPO | Source: Ambulatory Visit | Attending: Obstetrics and Gynecology | Admitting: Obstetrics and Gynecology

## 2012-10-27 DIAGNOSIS — O210 Mild hyperemesis gravidarum: Secondary | ICD-10-CM | POA: Insufficient documentation

## 2012-10-27 DIAGNOSIS — O219 Vomiting of pregnancy, unspecified: Secondary | ICD-10-CM

## 2012-10-27 LAB — URINALYSIS, ROUTINE W REFLEX MICROSCOPIC
Ketones, ur: 40 mg/dL — AB
Nitrite: NEGATIVE
Protein, ur: 30 mg/dL — AB
Urobilinogen, UA: 0.2 mg/dL (ref 0.0–1.0)

## 2012-10-27 LAB — URINE MICROSCOPIC-ADD ON

## 2012-10-27 MED ORDER — METOCLOPRAMIDE HCL 10 MG PO TABS: 10.0000 mg | ORAL_TABLET | Freq: Four times a day (QID) | ORAL | Status: DC

## 2012-10-27 MED ORDER — ONDANSETRON HCL 4 MG/2ML IJ SOLN
4.0000 mg | Freq: Once | INTRAMUSCULAR | Status: AC
  Administered 2012-10-27: 4 mg via INTRAVENOUS
  Filled 2012-10-27: qty 2

## 2012-10-27 MED ORDER — SODIUM CHLORIDE 0.9 % IJ SOLN
INTRAMUSCULAR | Status: AC
  Administered 2012-10-27: 3 mL
  Filled 2012-10-27: qty 6

## 2012-10-27 MED ORDER — DEXTROSE 5 % IN LACTATED RINGERS IV BOLUS
1000.0000 mL | Freq: Once | INTRAVENOUS | Status: AC
  Administered 2012-10-27: 1000 mL via INTRAVENOUS

## 2012-10-27 NOTE — MAU Provider Note (Signed)
History     CSN: [id number]  Arrival date and time: 10/27/12 1440   First Provider Initiated Contact with Patient 10/27/12 1633      Chief Complaint  Patient presents with  . Emesis During Pregnancy   HPI Candace [name] is 27 y.o. G3P2002 [redacted]w[redacted]d weeks presenting with nausea and vomiting in pregnancy.  She is a patient of Dr. Forestine Chute at [facility].  She was visiting her cousin in [place] last night and had worsening nausea and vomiting.  She couldn't come in because she is taking Diclegis and it makes her sleepy.  Medicine worked for a little while but now ineffective.  Zofran only hold sxs for 2 hrs.  She would like another medication.     Past Medical History  Diagnosis Date  . Anemia   . Eczema   . Allergy   . History of chlamydia   . History of gonorrhea   . Irregular heartbeat     Past Surgical History  Procedure Laterality Date  . No past surgeries      Family History  Problem Relation Age of Onset  . Cancer Mother     Uterine?  . Cancer Maternal Aunt     Multiple with Breast Cancer  . Hypertension Other   . Diabetes Other     History  Substance Use Topics  . Smoking status: Never Smoker   . Smokeless tobacco: Not on file  . Alcohol Use: Yes     Comment: socially; not now    Allergies:  Allergies  Allergen Reactions  . Hydrocodone     violent  . Vicodin (Hydrocodone-Acetaminophen) Other (See Comments)    violent  . Latex Itching and Rash    Prescriptions prior to admission  Medication Sig Dispense Refill  . Doxylamine-Pyridoxine (DICLEGIS) 10-10 MG TBEC Take 10 mg by mouth See admin instructions.  100 tablet  4  . ondansetron (ZOFRAN) 8 MG tablet Take 1 tablet (8 mg total) by mouth every 12 (twelve) hours as needed for nausea.  20 tablet  3  . Pediatric Multivit-Minerals-C (FLINTSTONES GUMMIES PO) Take by mouth daily.        Review of Systems  Constitutional: Negative for fever and chills.  Gastrointestinal: Positive for nausea and  vomiting (saw some blood with vomiting). Negative for abdominal pain.  Genitourinary:       Neg for vaginal bleeding or discharge.   Physical Exam   Blood pressure 102/76, pulse 104, temperature 98.2 F (36.8 C), resp. rate 18, height 5\' 2"  (1.575 m), weight 188 lb (85.276 kg).  Physical Exam  Constitutional: She is oriented to person, place, and time. She appears well-developed and well-nourished. No distress.  Cardiovascular: Normal rate.   Respiratory: Effort normal.  GI: There is no tenderness.  Vomited X 2 early in visit in MAU  Genitourinary:  Not indicated  Neurological: She is alert and oriented to person, place, and time.  Skin: Skin is warm and dry.  Psychiatric: She has a normal mood and affect. Her behavior is normal.    Results for orders placed during the hospital encounter of 10/27/12 (from the past 24 hour(s))  URINALYSIS, ROUTINE W REFLEX MICROSCOPIC     Status: Abnormal   Collection Time    10/27/12  3:21 PM      Result Value Range   Color, Urine YELLOW  YELLOW   APPearance HAZY (*) CLEAR   Specific Gravity, Urine 1.025  1.005 - 1.030   pH 6.0  5.0 - 8.0   Glucose, UA NEGATIVE  NEGATIVE mg/dL   Hgb urine dipstick TRACE (*) NEGATIVE   Bilirubin Urine SMALL (*) NEGATIVE   Ketones, ur 40 (*) NEGATIVE mg/dL   Protein, ur 30 (*) NEGATIVE mg/dL   Urobilinogen, UA 0.2  0.0 - 1.0 mg/dL   Nitrite NEGATIVE  NEGATIVE   Leukocytes, UA LARGE (*) NEGATIVE  URINE MICROSCOPIC-ADD ON     Status: Abnormal   Collection Time    10/27/12  3:21 PM      Result Value Range   Squamous Epithelial / LPF MANY (*) RARE   WBC, UA 21-50  <3 WBC/hpf   Bacteria, UA MANY (*) RARE   Urine-Other MUCOUS PRESENT    POCT PREGNANCY, URINE     Status: Abnormal   Collection Time    10/27/12  3:57 PM      Result Value Range   Preg Test, Ur POSITIVE (*) NEGATIVE   MAU Course  Procedures Urine culture pending  MDM Patient is actively vomiting.  UA Results not back.  Will given 1 liter  of D5LR with Zofran.  Patient declines phenergan because "it knocks me out even small dose".  She is driving.   Assessment and Plan  A:  Nausea and vomiting in first trimester        P: Rx for Reglan 10mg  q6hrs to pharmacy     Follow up with Dr. Despina Hidden     Stressed important of eating small meals often.  Candace [name],EVE M 10/27/2012, 6:23 PM

## 2012-10-27 NOTE — MAU Note (Signed)
Has not been able to eat since Monday, food or water, emesis x 3 today, all day yesterday

## 2012-10-29 LAB — URINE CULTURE

## 2012-11-01 NOTE — MAU Provider Note (Signed)
Attestation of Attending Supervision of Advanced Practitioner (CNM/NP): Evaluation and management procedures were performed by the Advanced Practitioner under my supervision and collaboration.  I have reviewed the Advanced Practitioner's note and chart, and I agree with the management and plan.  Vianne Grieshop 11/01/2012 11:59 AM   

## 2012-11-10 ENCOUNTER — Other Ambulatory Visit: Payer: Self-pay | Admitting: Obstetrics & Gynecology

## 2012-11-10 DIAGNOSIS — Z36 Encounter for antenatal screening of mother: Secondary | ICD-10-CM

## 2012-11-16 ENCOUNTER — Ambulatory Visit (INDEPENDENT_AMBULATORY_CARE_PROVIDER_SITE_OTHER): Payer: BC Managed Care – PPO | Admitting: Women's Health

## 2012-11-16 ENCOUNTER — Encounter: Payer: Self-pay | Admitting: Women's Health

## 2012-11-16 ENCOUNTER — Other Ambulatory Visit: Payer: Self-pay | Admitting: Obstetrics & Gynecology

## 2012-11-16 ENCOUNTER — Ambulatory Visit (INDEPENDENT_AMBULATORY_CARE_PROVIDER_SITE_OTHER): Payer: BC Managed Care – PPO

## 2012-11-16 VITALS — BP 108/70 | Wt 184.2 lb

## 2012-11-16 DIAGNOSIS — O021 Missed abortion: Secondary | ICD-10-CM

## 2012-11-16 DIAGNOSIS — Z331 Pregnant state, incidental: Secondary | ICD-10-CM

## 2012-11-16 DIAGNOSIS — Z36 Encounter for antenatal screening of mother: Secondary | ICD-10-CM

## 2012-11-16 DIAGNOSIS — Z1389 Encounter for screening for other disorder: Secondary | ICD-10-CM

## 2012-11-16 DIAGNOSIS — O219 Vomiting of pregnancy, unspecified: Secondary | ICD-10-CM

## 2012-11-16 DIAGNOSIS — A749 Chlamydial infection, unspecified: Secondary | ICD-10-CM

## 2012-11-16 LAB — POCT URINALYSIS DIPSTICK
Glucose, UA: NEGATIVE
Ketones, UA: NEGATIVE
Leukocytes, UA: NEGATIVE

## 2012-11-16 MED ORDER — METOCLOPRAMIDE HCL 10 MG PO TABS: 10.0000 mg | ORAL_TABLET | Freq: Four times a day (QID) | ORAL | Status: DC

## 2012-11-16 MED ORDER — AZITHROMYCIN 500 MG PO TABS: 1000.0000 mg | ORAL_TABLET | Freq: Once | ORAL | Status: DC

## 2012-11-16 NOTE — Progress Notes (Signed)
Went to [facility] 6/18 w/ report of n/v, diclegis not helping. Was given IV fluid and zofran, was told fhr was 177, and given rx for reglan which has been helping. Has been doing ok, did have few cramps yesterday, denies vb. Here today for 1st IT/NT- no FHR on u/s, appears 1wk less than expected GA. Discussed w/ [facility], to come back on Thurs to have pre-op w/ him, then D&C. Pt requested work note and refill on reglan as she is still having n/v.

## 2012-11-16 NOTE — Progress Notes (Signed)
Gets cramps sometimes. 1st IT today

## 2012-11-16 NOTE — Patient Instructions (Signed)
Miscarriage A miscarriage is the sudden loss of an unborn baby (fetus) before the 20th week of pregnancy. Most miscarriages happen in the first 3 months of pregnancy. Sometimes, it happens before a woman even knows she is pregnant. A miscarriage is also called a "spontaneous miscarriage" or "early pregnancy loss." Having a miscarriage can be an emotional experience. Talk with your caregiver about any questions you may have about miscarrying, the grieving process, and your future pregnancy plans. CAUSES   Problems with the fetal chromosomes that make it impossible for the baby to develop normally. Problems with the baby's genes or chromosomes are most often the result of errors that occur, by chance, as the embryo divides and grows. The problems are not inherited from the parents.  Infection of the cervix or uterus.   Hormone problems.   Problems with the cervix, such as having an incompetent cervix. This is when the tissue in the cervix is not strong enough to hold the pregnancy.   Problems with the uterus, such as an abnormally shaped uterus, uterine fibroids, or congenital abnormalities.   Certain medical conditions.   Smoking, drinking alcohol, or taking illegal drugs.   Trauma.  Often, the cause of a miscarriage is unknown.  SYMPTOMS   Vaginal bleeding or spotting, with or without cramps or pain.  Pain or cramping in the abdomen or lower back.  Passing fluid, tissue, or blood clots from the vagina. DIAGNOSIS  Your caregiver will perform a physical exam. You may also have an ultrasound to confirm the miscarriage. Blood or urine tests may also be ordered. TREATMENT   Sometimes, treatment is not necessary if you naturally pass all the fetal tissue that was in the uterus. If some of the fetus or placenta remains in the body (incomplete miscarriage), tissue left behind may become infected and must be removed. Usually, a dilation and curettage (D and C) procedure is performed.  During a D and C procedure, the cervix is widened (dilated) and any remaining fetal or placental tissue is gently removed from the uterus.  Antibiotic medicines are prescribed if there is an infection. Other medicines may be given to reduce the size of the uterus (contract) if there is a lot of bleeding.  If you have Rh negative blood and your baby was Rh positive, you will need a Rh immunoglobulin shot. This shot will protect any future baby from having Rh blood problems in future pregnancies. HOME CARE INSTRUCTIONS   Your caregiver may order bed rest or may allow you to continue light activity. Resume activity as directed by your caregiver.  Have someone help with home and family responsibilities during this time.   Keep track of the number of sanitary pads you use each day and how soaked (saturated) they are. Write down this information.   Do not use tampons. Do not douche or have sexual intercourse until approved by your caregiver.   Only take over-the-counter or prescription medicines for pain or discomfort as directed by your caregiver.   Do not take aspirin. Aspirin can cause bleeding.   Keep all follow-up appointments with your caregiver.   If you or your partner have problems with grieving, talk to your caregiver or seek counseling to help cope with the pregnancy loss. Allow enough time to grieve before trying to get pregnant again.  SEEK IMMEDIATE MEDICAL CARE IF:   You have severe cramps or pain in your back or abdomen.  You have a fever.  You pass large blood clots (walnut-sized   or larger) ortissue from your vagina. Save any tissue for your caregiver to inspect.   Your bleeding increases.   You have a thick, bad-smelling vaginal discharge.  You become lightheaded, weak, or you faint.   You have chills.  MAKE SURE YOU:  Understand these instructions.  Will watch your condition.  Will get help right away if you are not doing well or get  worse. Document Released: 10/22/2000 Document Revised: 10/28/2011 Document Reviewed: 06/17/2011 [employer] Patient Information 2014 [employer].  

## 2012-11-16 NOTE — Progress Notes (Signed)
U/S(12+6wks)-single IUP  NO FCA noted, CRL c/w 11+1wks cx long and closed, bilateral adnexa wnl, no free fluid noted, Candace [name] observed real time u/s and confirmed no FCA noted

## 2012-11-18 ENCOUNTER — Ambulatory Visit (INDEPENDENT_AMBULATORY_CARE_PROVIDER_SITE_OTHER): Payer: BC Managed Care – PPO | Admitting: Obstetrics & Gynecology

## 2012-11-18 ENCOUNTER — Encounter: Payer: Self-pay | Admitting: Obstetrics & Gynecology

## 2012-11-18 VITALS — BP 100/70 | Wt 181.0 lb

## 2012-11-18 DIAGNOSIS — O021 Missed abortion: Secondary | ICD-10-CM | POA: Insufficient documentation

## 2012-11-18 NOTE — Patient Instructions (Addendum)
Miscarriage A miscarriage is the sudden loss of an unborn baby (fetus) before the 20th week of pregnancy. Most miscarriages happen in the first 3 months of pregnancy. Sometimes, it happens before a woman even knows she is pregnant. A miscarriage is also called a "spontaneous miscarriage" or "early pregnancy loss." Having a miscarriage can be an emotional experience. Talk with your caregiver about any questions you may have about miscarrying, the grieving process, and your future pregnancy plans. CAUSES   Problems with the fetal chromosomes that make it impossible for the baby to develop normally. Problems with the baby's genes or chromosomes are most often the result of errors that occur, by chance, as the embryo divides and grows. The problems are not inherited from the parents.  Infection of the cervix or uterus.   Hormone problems.   Problems with the cervix, such as having an incompetent cervix. This is when the tissue in the cervix is not strong enough to hold the pregnancy.   Problems with the uterus, such as an abnormally shaped uterus, uterine fibroids, or congenital abnormalities.   Certain medical conditions.   Smoking, drinking alcohol, or taking illegal drugs.   Trauma.  Often, the cause of a miscarriage is unknown.  SYMPTOMS   Vaginal bleeding or spotting, with or without cramps or pain.  Pain or cramping in the abdomen or lower back.  Passing fluid, tissue, or blood clots from the vagina. DIAGNOSIS  Your caregiver will perform a physical exam. You may also have an ultrasound to confirm the miscarriage. Blood or urine tests may also be ordered. TREATMENT   Sometimes, treatment is not necessary if you naturally pass all the fetal tissue that was in the uterus. If some of the fetus or placenta remains in the body (incomplete miscarriage), tissue left behind may become infected and must be removed. Usually, a dilation and curettage (D and C) procedure is performed.  During a D and C procedure, the cervix is widened (dilated) and any remaining fetal or placental tissue is gently removed from the uterus.  Antibiotic medicines are prescribed if there is an infection. Other medicines may be given to reduce the size of the uterus (contract) if there is a lot of bleeding.  If you have Rh negative blood and your baby was Rh positive, you will need a Rh immunoglobulin shot. This shot will protect any future baby from having Rh blood problems in future pregnancies. HOME CARE INSTRUCTIONS   Your caregiver may order bed rest or may allow you to continue light activity. Resume activity as directed by your caregiver.  Have someone help with home and family responsibilities during this time.   Keep track of the number of sanitary pads you use each day and how soaked (saturated) they are. Write down this information.   Do not use tampons. Do not douche or have sexual intercourse until approved by your caregiver.   Only take over-the-counter or prescription medicines for pain or discomfort as directed by your caregiver.   Do not take aspirin. Aspirin can cause bleeding.   Keep all follow-up appointments with your caregiver.   If you or your partner have problems with grieving, talk to your caregiver or seek counseling to help cope with the pregnancy loss. Allow enough time to grieve before trying to get pregnant again.  SEEK IMMEDIATE MEDICAL CARE IF:   You have severe cramps or pain in your back or abdomen.  You have a fever.  You pass large blood clots (walnut-sized   or larger) ortissue from your vagina. Save any tissue for your caregiver to inspect.   Your bleeding increases.   You have a thick, bad-smelling vaginal discharge.  You become lightheaded, weak, or you faint.   You have chills.  MAKE SURE YOU:  Understand these instructions.  Will watch your condition.  Will get help right away if you are not doing well or get  worse. Document Released: 10/22/2000 Document Revised: 10/28/2011 Document Reviewed: 06/17/2011 [employer] Patient Information 2014 [employer].  

## 2012-11-18 NOTE — Progress Notes (Signed)
Patient ID: Candace [name], female   DOB: [birth year], 27 y.o.   MRN: [id number] Preoperative History and Physical  Candace [name] is a 27 y.o. [id number] with No LMP recorded. Patient is pregnant. admitted for a D&C for a missed Ab in the early second trimester .    PMH:    Past Medical History  Diagnosis Date  . Anemia   . Eczema   . Allergy   . History of chlamydia   . History of gonorrhea   . Irregular heartbeat     PSH:     Past Surgical History  Procedure Laterality Date  . No past surgeries      POb/GynH:      OB History   Grav Para Term Preterm Abortions TAB SAB Ect Mult Living   3 2 2       2       SH:   History  Substance Use Topics  . Smoking status: Never Smoker   . Smokeless tobacco: Not on file  . Alcohol Use: Yes     Comment: socially; not now    FH:    Family History  Problem Relation Age of Onset  . Cancer Mother     Uterine?  . Cancer Maternal Aunt     Multiple with Breast Cancer  . Hypertension Other   . Diabetes Other   . Other Other     stomach tumors     Allergies:  Allergies  Allergen Reactions  . Hydrocodone     violent  . Vicodin (Hydrocodone-Acetaminophen) Other (See Comments)    violent  . Latex Itching and Rash    Medications:      Current outpatient prescriptions:metoCLOPramide (REGLAN) 10 MG tablet, Take 1 tablet (10 mg total) by mouth every 6 (six) hours., Disp: 30 tablet, Rfl: 0;  Pediatric Multivit-Minerals-C (FLINTSTONES GUMMIES PO), Take by mouth daily., Disp: , Rfl: ;  azithromycin (ZITHROMAX) 500 MG tablet, Take 2 tablets (1,000 mg total) by mouth once., Disp: 2 tablet, Rfl: 0 Doxylamine-Pyridoxine (DICLEGIS) 10-10 MG TBEC, Take 10 mg by mouth See admin instructions., Disp: 100 tablet, Rfl: 4;  ondansetron (ZOFRAN) 8 MG tablet, Take 1 tablet (8 mg total) by mouth every 12 (twelve) hours as needed for nausea., Disp: 20 tablet, Rfl: 3  Review of Systems:   Review of Systems  Constitutional: Negative for fever,  chills, weight loss, malaise/fatigue and diaphoresis.  HENT: Negative for hearing loss, ear pain, nosebleeds, congestion, sore throat, neck pain, tinnitus and ear discharge.   Eyes: Negative for blurred vision, double vision, photophobia, pain, discharge and redness.  Respiratory: Negative for cough, hemoptysis, sputum production, shortness of breath, wheezing and stridor.   Cardiovascular: Negative for chest pain, palpitations, orthopnea, claudication, leg swelling and PND.  Gastrointestinal: Positive for abdominal pain. Negative for heartburn, nausea, vomiting, diarrhea, constipation, blood in stool and melena.  Genitourinary: Negative for dysuria, urgency, frequency, hematuria and flank pain.  Musculoskeletal: Negative for myalgias, back pain, joint pain and falls.  Skin: Negative for itching and rash.  Neurological: Negative for dizziness, tingling, tremors, sensory change, speech change, focal weakness, seizures, loss of consciousness, weakness and headaches.  Endo/Heme/Allergies: Negative for environmental allergies and polydipsia. Does not bruise/bleed easily.  Psychiatric/Behavioral: Negative for depression, suicidal ideas, hallucinations, memory loss and substance abuse. The patient is not nervous/anxious and does not have insomnia.      PHYSICAL EXAM:  Blood pressure 100/70, weight 181 lb (82.101 kg).    Vitals reviewed. Constitutional: She is  oriented to person, place, and time. She appears well-developed and well-nourished.  HENT:  Head: Normocephalic and atraumatic.  Right Ear: External ear normal.  Left Ear: External ear normal.  Nose: Nose normal.  Mouth/Throat: Oropharynx is clear and moist.  Eyes: Conjunctivae and EOM are normal. Pupils are equal, round, and reactive to light. Right eye exhibits no discharge. Left eye exhibits no discharge. No scleral icterus.  Neck: Normal range of motion. Neck supple. No tracheal deviation present. No thyromegaly present.   Cardiovascular: Normal rate, regular rhythm, normal heart sounds and intact distal pulses.  Exam reveals no gallop and no friction rub.   No murmur heard. Respiratory: Effort normal and breath sounds normal. No respiratory distress. She has no wheezes. She has no rales. She exhibits no tenderness.  GI: Soft. Bowel sounds are normal. She exhibits no distension and no mass. There is tenderness. There is no rebound and no guarding.  Genitourinary:       Vulva is normal without lesions Vagina is pink moist without discharge Cervix normal in appearance and pap is normal Uterus is 12 weeks size Adnexa is negative with normal sized ovaries by sonogram  Musculoskeletal: Normal range of motion. She exhibits no edema and no tenderness.  Neurological: She is alert and oriented to person, place, and time. She has normal reflexes. She displays normal reflexes. No cranial nerve deficit. She exhibits normal muscle tone. Coordination normal.  Skin: Skin is warm and dry. No rash noted. No erythema. No pallor.  Psychiatric: She has a normal mood and affect. Her behavior is normal. Judgment and thought content normal.    Labs: Results for orders placed in visit on 11/16/12 (from the past 336 hour(s))  POCT URINALYSIS DIPSTICK   Collection Time    11/16/12  9:40 AM      Result Value Range   Color, UA       Clarity, UA       Glucose, UA neg     Bilirubin, UA       Ketones, UA neg     Spec Grav, UA       Blood, UA neg     pH, UA       Protein, UA neg     Urobilinogen, UA       Nitrite, UA neg     Leukocytes, UA Negative      EKG: No orders found for this or any previous visit.  Imaging Studies: US Ob Limited  11/16/2012   OB SONOGRAM   Candace [name] is a 27 y.o. year old [id number] with EDD of 05/25/2013  which would correlate to  [redacted]w[redacted]d weeks gestation.  She was scheduled for a  NT Ultrasound today and reports no problems (cramping, spotting,etc.)   GESTATION: SINGLETON     FETAL ACTIVITY:           Heart rate         NO FCA NOTED          The fetus is inactive.   CERVIX: Long and closed  ADNEXA: The ovaries are normal.   GESTATIONAL AGE AND  BIOMETRICS:  Gestational criteria: Estimated Date of Delivery: 05/25/13 by early  ultrasound now at [redacted]w[redacted]d  Previous Scans:2      CROWN RUMP LENGTH           44.6 mm         11+1 weeks  AVERAGE EGA(BY THIS SCAN):   11+1 weeks TECHNICIAN COMMENTS:  U/S(12+6wks)-single IUP  NO FCA noted, CRL c/w 11+1wks cx long and closed,  bilateral adnexa wnl, no free fluid noted, Shawna Clamp observed real  time u/s and confirmed no FCA noted        A copy of this report including all images has been saved and backed up to  a second source for retrieval if needed. All measures and details of the  anatomical scan, placentation, fluid volume and pelvic anatomy are  contained in that report.  Chari Manning 11/16/2012 10:33 AM  Clinical Impression and recommendations:  I have reviewed the sonogram results above, combined with the patient's  current clinical course, below are my impressions and any appropriate  recommendations for management based on the sonographic findings.  Missed Ab at 11 weeks Patient to make appointment with me for pre op D&C  Recommend routine prenatal care based on this sonogram or as clinically  indicated  Cleopha Indelicato H 11/16/2012 11:18 AM          Assessment: Missed abortion in the second trimester Patient Active Problem List   Diagnosis Date Noted  . Chlamydia infection complicating pregnancy in first trimester 10/23/2012  . Supervision of other normal pregnancy 10/19/2012  . Hypoglycemia 04/05/2012  . Hyperlipidemia 10/17/2011  . Iron deficiency anemia 09/10/2011  . Constipation 09/10/2011  . Eczema 09/10/2011  . Seasonal allergies 09/10/2011  . Obesity 09/10/2011  . Abnormal involuntary movements 09/10/2011    Plan: Cervical dilation and uterine evacuation using  suction  Sundi Slevin H 11/18/2012 4:25 PM

## 2012-11-19 ENCOUNTER — Encounter (HOSPITAL_COMMUNITY)
Admission: RE | Admit: 2012-11-19 | Discharge: 2012-11-19 | Disposition: A | Discharge status: Home / Self Care | Payer: BC Managed Care – PPO | Source: Ambulatory Visit | Attending: Obstetrics & Gynecology | Admitting: Obstetrics & Gynecology

## 2012-11-19 ENCOUNTER — Encounter (HOSPITAL_COMMUNITY): Payer: Self-pay

## 2012-11-19 ENCOUNTER — Encounter (HOSPITAL_COMMUNITY): Payer: Self-pay | Admitting: Pharmacy Technician

## 2012-11-19 ENCOUNTER — Other Ambulatory Visit: Payer: Self-pay

## 2012-11-19 LAB — COMPREHENSIVE METABOLIC PANEL
AST: 19 U/L (ref 0–37)
Alkaline Phosphatase: 46 U/L (ref 39–117)
CO2: 22 mEq/L (ref 19–32)
Chloride: 103 mEq/L (ref 96–112)
Creatinine, Ser: 0.76 mg/dL (ref 0.50–1.10)
GFR calc non Af Amer: >90 mL/min (ref >90)
Potassium: 4.1 mEq/L (ref 3.5–5.1)
Total Bilirubin: 0.3 mg/dL (ref 0.3–1.2)

## 2012-11-19 LAB — CBC
HCT: 35 % — ABNORMAL LOW (ref 36.0–46.0)
MCV: 81.2 fL (ref 78.0–100.0)
Platelets: 239 10*3/uL (ref 150–400)
RBC: 4.31 MIL/uL (ref 3.87–5.11)
WBC: 5.1 10*3/uL (ref 4.0–10.5)

## 2012-11-19 LAB — URINALYSIS, ROUTINE W REFLEX MICROSCOPIC
Bilirubin Urine: NEGATIVE
Glucose, UA: NEGATIVE mg/dL
Hgb urine dipstick: NEGATIVE
Ketones, ur: NEGATIVE mg/dL
Protein, ur: NEGATIVE mg/dL
Urobilinogen, UA: 0.2 mg/dL (ref 0.0–1.0)

## 2012-11-19 NOTE — Patient Instructions (Addendum)
LESIA MONICA  11/19/2012   Your procedure is scheduled on:  11/22/12  Report to [facility] at 11:00 AM.  Call this number if you have problems the morning of surgery: [phone]   Remember:   Do not eat food or drink liquids after midnight.   Take these medicines the morning of surgery with A SIP OF WATER: Reglan if needed.   Do not wear jewelry, make-up or nail polish.  Do not wear lotions, powders, or perfumes. You may wear deodorant.  Do not shave 48 hours prior to surgery. Men may shave face and neck.  Do not bring valuables to the hospital.  [facility] is not responsible for any belongings or valuables.  Contacts, dentures or bridgework may not be worn into surgery.  Leave suitcase in the car. After surgery it may be brought to your room.  For patients admitted to the hospital, checkout time is 11:00 AM the day of discharge.   Patients discharged the day of surgery will not be allowed to drive home.    Special Instructions: Shower using CHG 2 nights before surgery and the night before surgery.  If you shower the day of surgery use CHG.  Use special wash - you have one bottle of CHG for all showers.  You should use approximately 1/3 of the bottle for each shower.   Please read over the following fact sheets that you were given: Pain Booklet, Surgical Site Infection Prevention, Anesthesia Post-op Instructions and Care and Recovery After Surgery    Dilation and Curettage or Vacuum Curettage Dilation and curettage (D&C) and vacuum curettage are minor procedures. A D&C involves stretching (dilation) the cervix and scraping (curettage) the inside lining of the womb (uterus). During a D&C, tissue is gently scraped from the inside lining of the uterus. During a vacuum curettage, the lining and tissue in the uterus are removed with the use of gentle suction. Curettage may be performed for diagnostic or therapeutic purposes. As a diagnostic procedure, curettage is performed for the purpose  of examining tissues from the uterus. Tissue examination may help determine causes or treatment options for symptoms. A diagnostic curettage may be performed for the following symptoms:  Irregular bleeding in the uterus.  Bleeding with the development of clots.  Spotting between menstrual periods.  Prolonged menstrual periods.  Bleeding after menopause.  No menstrual period (amenorrhea).  A change in size and shape of the uterus. A therapeutic curettage is performed to remove tissue, blood, or a contraceptive device. Therapeutic curettage may be performed for the following conditions:   Removal of an IUD (intrauterine device).  Removal of retained placenta after giving birth. Retained placenta can cause bleeding severe enough to require transfusions or an infection.  Abortion.  Miscarriage.  Removal of polyps inside the uterus.  Removal of uncommon types of fibroids (noncancerous lumps). LET YOUR CAREGIVER KNOW ABOUT:   Allergies to food or medicine.  Medicines taken, including vitamins, herbs, eyedrops, over-the-counter medicines, and creams.  Use of steroids (by mouth or creams).  Previous problems with anesthetics or numbing medicines.  History of bleeding problems or blood clots.  Previous surgery.  Other health problems, including diabetes and kidney problems.  Possibility of pregnancy, if this applies. RISKS AND COMPLICATIONS   Excessive bleeding.  Infection of the uterus.  Damage to the cervix.  Development of scar tissue (adhesions) inside the uterus, later causing abnormal amounts of menstrual bleeding.  Complications from the general anesthetic, if a general anesthetic is used.  Putting a hole (perforation) in the uterus. This is rare. BEFORE THE PROCEDURE   Eat and drink before the procedure only as directed by your caregiver.  Arrange for someone to take you home. PROCEDURE   This procedure may be done in a hospital, outpatient clinic, or  caregiver's office.  You may be given a general anesthetic or a local anesthetic in and around the cervix.  You will lie on your back with your legs in stirrups.  There are two ways in which your cervix can be softened and dilated. These include:  Taking a medicine.  Having thin rods (laminaria) inserted into your cervix.  A curved tool (curette) will scrape cells from the inside lining of the uterus and will then be removed. This procedure usually takes about 15 to 30 minutes. AFTER THE PROCEDURE   You will rest in the recovery area until you are stable and are ready to go home.  You will need to have someone take you home.  You may feel sick to your stomach (nauseous) or throw up (vomit) if you had general anesthesia.  You may have a sore throat if a tube was placed in your throat during general anesthesia.  You may have light cramping and bleeding for 2 days to 2 weeks after the procedure.  Your uterus needs to make a new lining after the procedure. This may make your next period late. Document Released: 04/28/2005 Document Revised: 07/21/2011 Document Reviewed: 11/24/2008 [facility] Patient Information 2014 [place], [place].    PATIENT INSTRUCTIONS POST-ANESTHESIA  IMMEDIATELY FOLLOWING SURGERY:  Do not drive or operate machinery for the first twenty four hours after surgery.  Do not make any important decisions for twenty four hours after surgery or while taking narcotic pain medications or sedatives.  If you develop intractable nausea and vomiting or a severe headache please notify your doctor immediately.  FOLLOW-UP:  Please make an appointment with your surgeon as instructed. You do not need to follow up with anesthesia unless specifically instructed to do so.  WOUND CARE INSTRUCTIONS (if applicable):  Keep a dry clean dressing on the anesthesia/puncture wound site if there is drainage.  Once the wound has quit draining you may leave it open to air.  Generally you should  leave the bandage intact for twenty four hours unless there is drainage.  If the epidural site drains for more than 36-48 hours please call the anesthesia department.  QUESTIONS?:  Please feel free to call your physician or the hospital operator if you have any questions, and they will be happy to assist you.

## 2012-11-22 ENCOUNTER — Ambulatory Visit (HOSPITAL_COMMUNITY): Payer: BC Managed Care – PPO | Admitting: Anesthesiology

## 2012-11-22 ENCOUNTER — Encounter (HOSPITAL_COMMUNITY): Payer: Self-pay | Admitting: Anesthesiology

## 2012-11-22 ENCOUNTER — Encounter (HOSPITAL_COMMUNITY)
Admission: RE | Disposition: A | Discharge status: Home / Self Care | Payer: Self-pay | Source: Ambulatory Visit | Attending: Obstetrics & Gynecology

## 2012-11-22 ENCOUNTER — Ambulatory Visit (HOSPITAL_COMMUNITY)
Admission: RE | Admit: 2012-11-22 | Discharge: 2012-11-22 | Disposition: A | Discharge status: Home / Self Care | Payer: BC Managed Care – PPO | Source: Ambulatory Visit | Attending: Obstetrics & Gynecology | Admitting: Obstetrics & Gynecology

## 2012-11-22 DIAGNOSIS — O021 Missed abortion: Secondary | ICD-10-CM

## 2012-11-22 HISTORY — PX: DILATION AND CURETTAGE OF UTERUS: SHX78

## 2012-11-22 SURGERY — DILATION AND CURETTAGE
Anesthesia: General | Site: Uterus | Wound class: Clean Contaminated

## 2012-11-22 MED ORDER — LACTATED RINGERS IV SOLN
INTRAVENOUS | Status: DC | PRN
  Administered 2012-11-22: 12:00:00 via INTRAVENOUS

## 2012-11-22 MED ORDER — OXYCODONE-ACETAMINOPHEN 7.5-325 MG PO TABS: 1.0000 | ORAL_TABLET | Freq: Four times a day (QID) | ORAL | Status: DC | PRN

## 2012-11-22 MED ORDER — LIDOCAINE HCL (CARDIAC) 20 MG/ML IV SOLN
INTRAVENOUS | Status: DC | PRN
  Administered 2012-11-22: 50 mg via INTRAVENOUS

## 2012-11-22 MED ORDER — DEXAMETHASONE SODIUM PHOSPHATE 4 MG/ML IJ SOLN
INTRAMUSCULAR | Status: AC
  Filled 2012-11-22: qty 1

## 2012-11-22 MED ORDER — FENTANYL CITRATE 0.05 MG/ML IJ SOLN
INTRAMUSCULAR | Status: AC
  Filled 2012-11-22: qty 2

## 2012-11-22 MED ORDER — ONDANSETRON HCL 4 MG/2ML IJ SOLN
INTRAMUSCULAR | Status: AC
  Filled 2012-11-22: qty 2

## 2012-11-22 MED ORDER — METHYLERGONOVINE MALEATE 0.2 MG/ML IJ SOLN
INTRAMUSCULAR | Status: DC | PRN
  Administered 2012-11-22: 0.2 mg via INTRAMUSCULAR

## 2012-11-22 MED ORDER — KETOROLAC TROMETHAMINE 30 MG/ML IJ SOLN
INTRAMUSCULAR | Status: AC
  Filled 2012-11-22: qty 1

## 2012-11-22 MED ORDER — ONDANSETRON HCL 4 MG/2ML IJ SOLN
4.0000 mg | Freq: Once | INTRAMUSCULAR | Status: AC
  Administered 2012-11-22: 4 mg via INTRAVENOUS

## 2012-11-22 MED ORDER — 0.9 % SODIUM CHLORIDE (POUR BTL) OPTIME
TOPICAL | Status: DC | PRN
  Administered 2012-11-22: 1000 mL

## 2012-11-22 MED ORDER — METHYLERGONOVINE MALEATE 0.2 MG/ML IJ SOLN
INTRAMUSCULAR | Status: AC
  Filled 2012-11-22: qty 1

## 2012-11-22 MED ORDER — MIDAZOLAM HCL 2 MG/2ML IJ SOLN
INTRAMUSCULAR | Status: AC
  Filled 2012-11-22: qty 2

## 2012-11-22 MED ORDER — DEXAMETHASONE SODIUM PHOSPHATE 4 MG/ML IJ SOLN
4.0000 mg | Freq: Once | INTRAMUSCULAR | Status: AC
  Administered 2012-11-22: 4 mg via INTRAVENOUS

## 2012-11-22 MED ORDER — METHYLERGONOVINE MALEATE 0.2 MG PO TABS: ORAL_TABLET | ORAL | Status: DC

## 2012-11-22 MED ORDER — PROPOFOL 10 MG/ML IV BOLUS
INTRAVENOUS | Status: DC | PRN
  Administered 2012-11-22: 200 mg via INTRAVENOUS

## 2012-11-22 MED ORDER — KETOROLAC TROMETHAMINE 10 MG PO TABS: 10.0000 mg | ORAL_TABLET | Freq: Three times a day (TID) | ORAL | Status: DC | PRN

## 2012-11-22 MED ORDER — MIDAZOLAM HCL 2 MG/2ML IJ SOLN
1.0000 mg | INTRAMUSCULAR | Status: DC | PRN
  Administered 2012-11-22: 2 mg via INTRAVENOUS

## 2012-11-22 MED ORDER — CEFAZOLIN SODIUM-DEXTROSE 2-3 GM-% IV SOLR
2.0000 g | INTRAVENOUS | Status: AC
  Administered 2012-11-22: 2 g via INTRAVENOUS

## 2012-11-22 MED ORDER — LACTATED RINGERS IV SOLN
INTRAVENOUS | Status: DC
  Administered 2012-11-22: 1000 mL via INTRAVENOUS

## 2012-11-22 MED ORDER — CEFAZOLIN SODIUM-DEXTROSE 2-3 GM-% IV SOLR
INTRAVENOUS | Status: AC
  Filled 2012-11-22: qty 50

## 2012-11-22 MED ORDER — FENTANYL CITRATE 0.05 MG/ML IJ SOLN
INTRAMUSCULAR | Status: DC | PRN
  Administered 2012-11-22 (×2): 25 ug via INTRAVENOUS

## 2012-11-22 MED ORDER — KETOROLAC TROMETHAMINE 30 MG/ML IJ SOLN
30.0000 mg | Freq: Once | INTRAMUSCULAR | Status: AC
  Administered 2012-11-22: 30 mg via INTRAVENOUS

## 2012-11-22 SURGICAL SUPPLY — 26 items
BAG HAMPER (MISCELLANEOUS) ×2 IMPLANT
CLOTH BEACON ORANGE TIMEOUT ST (SAFETY) ×2 IMPLANT
COVER LIGHT HANDLE STERIS (MISCELLANEOUS) ×4 IMPLANT
COVER MAYO STAND XLG (DRAPE) ×1 IMPLANT
FORMALIN 10 PREFIL 480ML (MISCELLANEOUS) ×2 IMPLANT
GAUZE SPONGE 4X4 16PLY XRAY LF (GAUZE/BANDAGES/DRESSINGS) ×2 IMPLANT
GLOVE BIOGEL PI IND STRL 8 (GLOVE) ×1 IMPLANT
GLOVE BIOGEL PI INDICATOR 8 (GLOVE) ×1
GLOVE ECLIPSE 7.0 STRL STRAW (GLOVE) ×1 IMPLANT
GLOVE EXAM NITRILE LRG STRL (GLOVE) ×1 IMPLANT
GLOVE SKINSENSE NS SZ7.0 (GLOVE) ×1
GLOVE SKINSENSE NS SZ8.0 LF (GLOVE) ×1
GLOVE SKINSENSE STRL SZ7.0 (GLOVE) IMPLANT
GLOVE SKINSENSE STRL SZ8.0 LF (GLOVE) IMPLANT
GOWN STRL REIN XL XLG (GOWN DISPOSABLE) ×5 IMPLANT
KIT BERKELEY 1ST TRIMESTER 3/8 (MISCELLANEOUS) ×2 IMPLANT
KIT ROOM TURNOVER AP CYSTO (KITS) ×2 IMPLANT
MANIFOLD NEPTUNE II (INSTRUMENTS) ×2 IMPLANT
MARKER SKIN DUAL TIP RULER LAB (MISCELLANEOUS) ×2 IMPLANT
NS IRRIG 1000ML POUR BTL (IV SOLUTION) ×2 IMPLANT
PACK BASIC III (CUSTOM PROCEDURE TRAY) ×2
PACK SRG BSC III STRL LF ECLPS (CUSTOM PROCEDURE TRAY) ×1 IMPLANT
PAD ARMBOARD 7.5X6 YLW CONV (MISCELLANEOUS) ×2 IMPLANT
SET BERKELEY SUCTION TUBING (SUCTIONS) ×2 IMPLANT
TOWEL OR 17X26 4PK STRL BLUE (TOWEL DISPOSABLE) ×2 IMPLANT
VACURETTE 12MM (CANNULA) ×1 IMPLANT

## 2012-11-22 NOTE — Anesthesia Postprocedure Evaluation (Signed)
  Anesthesia Post-op Note  Patient: Candace [name]  Procedure(s) Performed: Procedure(s): SUCTION DILATATION AND CURETTAGE (N/A)  Patient Location: PACU  Anesthesia Type:General  Level of Consciousness: awake, alert  and oriented  Airway and Oxygen Therapy: Patient Spontanous Breathing  Post-op Pain: mild  Post-op Assessment: Post-op Vital signs reviewed, Patient's Cardiovascular Status Stable, Respiratory Function Stable, Patent Airway, NAUSEA AND VOMITING PRESENT, Pain level controlled, No headache, No backache, No residual numbness and No residual motor weakness  Post-op Vital Signs: Reviewed and stable  Complications: No apparent anesthesia complications

## 2012-11-22 NOTE — Anesthesia Preprocedure Evaluation (Addendum)
Anesthesia Evaluation  Patient identified by MRN, date of birth, ID band Patient awake    Reviewed: Allergy & Precautions, H&P , NPO status , Patient's Chart, lab work & pertinent test results  Airway Mallampati: II  Neck ROM: Full    Dental  (+) Teeth Intact   Pulmonary  breath sounds clear to auscultation        Cardiovascular + dysrhythmias (irreg hr) Rhythm:Regular Rate:Normal     Neuro/Psych    GI/Hepatic negative GI ROS,   Endo/Other    Renal/GU      Musculoskeletal   Abdominal   Peds  Hematology   Anesthesia Other Findings   Reproductive/Obstetrics                          Anesthesia Physical Anesthesia Plan  ASA: II  Anesthesia Plan: General   Post-op Pain Management:    Induction: Intravenous  Airway Management Planned: LMA  Additional Equipment:   Intra-op Plan:   Post-operative Plan: Extubation in OR  Informed Consent: I have reviewed the patients History and Physical, chart, labs and discussed the procedure including the risks, benefits and alternatives for the proposed anesthesia with the patient or authorized representative who has indicated his/her understanding and acceptance.     Plan Discussed with:   Anesthesia Plan Comments:         Anesthesia Quick Evaluation

## 2012-11-22 NOTE — Interval H&P Note (Signed)
History and Physical Interval Note:  11/22/2012 12:36 PM  Candace [name]  has presented today for surgery, with the diagnosis of missed abortion  The various methods of treatment have been discussed with the patient and family. After consideration of risks, benefits and other options for treatment, the patient has consented to  Procedure(s): SUCTION DILATATION AND CURETTAGE (N/A) as a surgical intervention .  The patient's history has been reviewed, patient examined, no change in status, stable for surgery.  I have reviewed the patient's chart and labs.  Questions were answered to the patient's satisfaction.     Gerik Coberly H

## 2012-11-22 NOTE — H&P (Signed)
Preoperative History and Physical  Candace [name] is a 27 y.o. [id number] with No LMP recorded. Patient is pregnant. by early sonogram should be about [redacted] weeks pregnant with a loss, no fetal heart rate measures about 11 weeks or so. admitted for a suction and sharp uterine curettage for removal.    PMH:    Past Medical History  Diagnosis Date  . Anemia   . Eczema   . Allergy   . History of chlamydia   . History of gonorrhea   . Irregular heartbeat     PSH:     Past Surgical History  Procedure Laterality Date  . No past surgeries      POb/GynH:      OB History   Grav Para Term Preterm Abortions TAB SAB Ect Mult Living   3 2 2       2       SH:   History  Substance Use Topics  . Smoking status: Never Smoker   . Smokeless tobacco: Not on file  . Alcohol Use: No     Comment: socially; not now    FH:    Family History  Problem Relation Age of Onset  . Cancer Mother     Uterine?  . Cancer Maternal Aunt     Multiple with Breast Cancer  . Hypertension Other   . Diabetes Other   . Other Other     stomach tumors     Allergies:  Allergies  Allergen Reactions  . Hydrocodone     violent  . Vicodin (Hydrocodone-Acetaminophen) Other (See Comments)    violent  . Latex Itching and Rash    Medications:      No current facility-administered medications for this encounter.  Review of Systems:   Review of Systems  Constitutional: Negative for fever, chills, weight loss, malaise/fatigue and diaphoresis.  HENT: Negative for hearing loss, ear pain, nosebleeds, congestion, sore throat, neck pain, tinnitus and ear discharge.   Eyes: Negative for blurred vision, double vision, photophobia, pain, discharge and redness.  Respiratory: Negative for cough, hemoptysis, sputum production, shortness of breath, wheezing and stridor.   Cardiovascular: Negative for chest pain, palpitations, orthopnea, claudication, leg swelling and PND.  Gastrointestinal: Positive for abdominal pain.  Negative for heartburn, nausea, vomiting, diarrhea, constipation, blood in stool and melena.  Genitourinary: Negative for dysuria, urgency, frequency, hematuria and flank pain.  Musculoskeletal: Negative for myalgias, back pain, joint pain and falls.  Skin: Negative for itching and rash.  Neurological: Negative for dizziness, tingling, tremors, sensory change, speech change, focal weakness, seizures, loss of consciousness, weakness and headaches.  Endo/Heme/Allergies: Negative for environmental allergies and polydipsia. Does not bruise/bleed easily.  Psychiatric/Behavioral: Negative for depression, suicidal ideas, hallucinations, memory loss and substance abuse. The patient is not nervous/anxious and does not have insomnia.      PHYSICAL EXAM:  Blood pressure 109/74, pulse 83, temperature 98 F (36.7 C), temperature source Oral, resp. rate 20, height 5' 2.5" (1.588 m), weight 182 lb (82.555 kg), SpO2 100.00%.    Vitals reviewed. Constitutional: She is oriented to person, place, and time. She appears well-developed and well-nourished.  HENT:  Head: Normocephalic and atraumatic.  Right Ear: External ear normal.  Left Ear: External ear normal.  Nose: Nose normal.  Mouth/Throat: Oropharynx is clear and moist.  Eyes: Conjunctivae and EOM are normal. Pupils are equal, round, and reactive to light. Right eye exhibits no discharge. Left eye exhibits no discharge. No scleral icterus.  Neck: Normal range of  motion. Neck supple. No tracheal deviation present. No thyromegaly present.  Cardiovascular: Normal rate, regular rhythm, normal heart sounds and intact distal pulses.  Exam reveals no gallop and no friction rub.   No murmur heard. Respiratory: Effort normal and breath sounds normal. No respiratory distress. She has no wheezes. She has no rales. She exhibits no tenderness.  GI: Soft. Bowel sounds are normal. She exhibits no distension and no mass. There is tenderness. There is no rebound and no  guarding.  Genitourinary:       Vulva is normal without lesions Vagina is pink moist without discharge Cervix normal in appearance and pap is normal Uterus is enlarged 11 weeks size Adnexa is negative with normal sized ovaries by sonogram  Musculoskeletal: Normal range of motion. She exhibits no edema and no tenderness.  Neurological: She is alert and oriented to person, place, and time. She has normal reflexes. She displays normal reflexes. No cranial nerve deficit. She exhibits normal muscle tone. Coordination normal.  Skin: Skin is warm and dry. No rash noted. No erythema. No pallor.  Psychiatric: She has a normal mood and affect. Her behavior is normal. Judgment and thought content normal.    Labs: Results for orders placed during the hospital encounter of 11/19/12 (from the past 336 hour(s))  URINALYSIS, ROUTINE W REFLEX MICROSCOPIC   Collection Time    11/19/12  1:02 PM      Result Value Range   Color, Urine YELLOW  YELLOW   APPearance CLEAR  CLEAR   Specific Gravity, Urine >1.030 (*) 1.005 - 1.030   pH 6.0  5.0 - 8.0   Glucose, UA NEGATIVE  NEGATIVE mg/dL   Hgb urine dipstick NEGATIVE  NEGATIVE   Bilirubin Urine NEGATIVE  NEGATIVE   Ketones, ur NEGATIVE  NEGATIVE mg/dL   Protein, ur NEGATIVE  NEGATIVE mg/dL   Urobilinogen, UA 0.2  0.0 - 1.0 mg/dL   Nitrite NEGATIVE  NEGATIVE   Leukocytes, UA NEGATIVE  NEGATIVE  CBC   Collection Time    11/19/12  1:15 PM      Result Value Range   WBC 5.1  4.0 - 10.5 K/uL   RBC 4.31  3.87 - 5.11 MIL/uL   Hemoglobin 12.3  12.0 - 15.0 g/dL   HCT 16.1 (*) 09.6 - 04.5 %   MCV 81.2  78.0 - 100.0 fL   MCH 28.5  26.0 - 34.0 pg   MCHC 35.1  30.0 - 36.0 g/dL   RDW 40.9  81.1 - 91.4 %   Platelets 239  150 - 400 K/uL  COMPREHENSIVE METABOLIC PANEL   Collection Time    11/19/12  1:15 PM      Result Value Range   Sodium 134 (*) 135 - 145 mEq/L   Potassium 4.1  3.5 - 5.1 mEq/L   Chloride 103  96 - 112 mEq/L   CO2 22  19 - 32 mEq/L    Glucose, Bld 86  70 - 99 mg/dL   BUN 7  6 - 23 mg/dL   Creatinine, Ser 7.82  0.50 - 1.10 mg/dL   Calcium 9.2  8.4 - 95.6 mg/dL   Total Protein 6.6  6.0 - 8.3 g/dL   Albumin 3.4 (*) 3.5 - 5.2 g/dL   AST 19  0 - 37 U/L   ALT 19  0 - 35 U/L   Alkaline Phosphatase 46  39 - 117 U/L   Total Bilirubin 0.3  0.3 - 1.2 mg/dL   GFR calc  non Af Amer >90  >90 mL/min   GFR calc Af Amer >90  >90 mL/min  Results for orders placed in visit on 11/16/12 (from the past 336 hour(s))  POCT URINALYSIS DIPSTICK   Collection Time    11/16/12  9:40 AM      Result Value Range   Color, UA       Clarity, UA       Glucose, UA neg     Bilirubin, UA       Ketones, UA neg     Spec Grav, UA       Blood, UA neg     pH, UA       Protein, UA neg     Urobilinogen, UA       Nitrite, UA neg     Leukocytes, UA Negative      EKG: Orders placed in visit on 11/19/12  . EKG 12-LEAD  . EKG 12-LEAD    Imaging Studies: US Ob Limited  11/16/2012   OB SONOGRAM   Candace [name] is a 27 y.o. year old [id number] with EDD of 05/25/2013  which would correlate to  [redacted]w[redacted]d weeks gestation.  She was scheduled for a  NT Ultrasound today and reports no problems (cramping, spotting,etc.)   GESTATION: SINGLETON     FETAL ACTIVITY:          Heart rate         NO FCA NOTED          The fetus is inactive.   CERVIX: Long and closed  ADNEXA: The ovaries are normal.   GESTATIONAL AGE AND  BIOMETRICS:  Gestational criteria: Estimated Date of Delivery: 05/25/13 by early  ultrasound now at [redacted]w[redacted]d  Previous Scans:2      CROWN RUMP LENGTH           44.6 mm         11+1 weeks                                                                               AVERAGE EGA(BY THIS SCAN):   11+1 weeks TECHNICIAN COMMENTS:  U/S(12+6wks)-single IUP  NO FCA noted, CRL c/w 11+1wks cx long and closed,  bilateral adnexa wnl, no free fluid noted, Shawna Clamp observed real  time u/s and confirmed no FCA noted        A copy of this report including all images has been  saved and backed up to  a second source for retrieval if needed. All measures and details of the  anatomical scan, placentation, fluid volume and pelvic anatomy are  contained in that report.  Chari Manning 11/16/2012 10:33 AM  Clinical Impression and recommendations:  I have reviewed the sonogram results above, combined with the patient's  current clinical course, below are my impressions and any appropriate  recommendations for management based on the sonographic findings.  Missed Ab at 11 weeks Patient to make appointment with me for pre op D&C  Recommend routine prenatal care based on this sonogram or as clinically  indicated  Jaquarious Grey H 11/16/2012 11:18 AM          Assessment: Missed ab in early second trimester Patient  Active Problem List   Diagnosis Date Noted  . Missed abortion 11/18/2012  . Chlamydia infection complicating pregnancy in first trimester 10/23/2012  . Supervision of other normal pregnancy 10/19/2012  . Hypoglycemia 04/05/2012  . Hyperlipidemia 10/17/2011  . Iron deficiency anemia 09/10/2011  . Constipation 09/10/2011  . Eczema 09/10/2011  . Seasonal allergies 09/10/2011  . Obesity 09/10/2011  . Abnormal involuntary movements 09/10/2011    Plan: Suction and sharp uterine curettage  Wiliam Cauthorn H 11/22/2012 11:46 AM

## 2012-11-22 NOTE — Op Note (Signed)
Preoperative diagnosis:  1.  Missed Abortion, 2nd trimester  Postoperative diagnosis:  Same as above  Procedure:  Cervical dilation with suction and sharp uterine curettage  Surgeon:  Lazaro Arms  Anesthesia:  Laryngeal mask airway  Findings:  Patient has a nonviable IUP at [redacted] weeks gestation by early first trimester sonogram.  Description of operation:  The patient was taken to the operating room and placed in the supine position.  She underwent laryngeal mask airway general anesthesia.  The patient was placed in the dorsal lithotomy position.  The vagina was prepped and draped in the usual sterile fashion.  A Graves speculum was placed.  The anterior cervix was grasped with a single-tooth tenaculum.  The cervix was dilated serially with Hegar dilators.  A #12 curved suction curette was placed in the uterus.  The suction pressure was placed at 55 and several passes were made.  All of the intrauterine contents were removed.  The sharp curette was used x1 to feel uterine crie in all areas.  The patient was given Methergine 0.2 mg IM x1.  There was good hemostasis.  The patient was given 2 grams Ancef preoperatively.  The patient was given Toradol 30 mg IV preoperatively.  Estimated blood loss for the procedure was 200 cc.  The patient was awakened from anesthesia taken to the recovery room in good stable condition.  All counts were correct x3.  Melvine Julin H 11/22/2012 1:18 PM

## 2012-11-22 NOTE — Transfer of Care (Signed)
Immediate Anesthesia Transfer of Care Note  Patient: Candace [name]  Procedure(s) Performed: Procedure(s): SUCTION DILATATION AND CURETTAGE (N/A)  Patient Location: PACU  Anesthesia Type:General  Level of Consciousness: awake, alert  and oriented  Airway & Oxygen Therapy: Patient Spontanous Breathing  Post-op Assessment: Report given to PACU RN and Post -op Vital signs reviewed and stable  Post vital signs: Reviewed and stable  Complications: No apparent anesthesia complications

## 2012-11-22 NOTE — Preoperative (Signed)
Beta Blockers   Reason not to administer Beta Blockers:Not Applicable 

## 2012-11-24 ENCOUNTER — Encounter (HOSPITAL_COMMUNITY): Payer: Self-pay | Admitting: Obstetrics & Gynecology

## 2012-12-02 ENCOUNTER — Ambulatory Visit (INDEPENDENT_AMBULATORY_CARE_PROVIDER_SITE_OTHER): Payer: BC Managed Care – PPO | Admitting: Obstetrics & Gynecology

## 2012-12-02 ENCOUNTER — Encounter: Payer: Self-pay | Admitting: Obstetrics & Gynecology

## 2012-12-02 VITALS — BP 100/76 | Ht 62.0 in | Wt 180.5 lb

## 2012-12-02 DIAGNOSIS — Z9889 Other specified postprocedural states: Secondary | ICD-10-CM

## 2012-12-02 MED ORDER — ETONOGESTREL-ETHINYL ESTRADIOL 0.12-0.015 MG/24HR VA RING: VAGINAL_RING | VAGINAL | Status: DC

## 2012-12-02 NOTE — Patient Instructions (Signed)

## 2012-12-02 NOTE — Progress Notes (Signed)
Patient ID: Candace [name], female   DOB: 30-Nov-1985, 27 y.o.   MRN: [id number] Status post D&C for missed ab Doing well No complaints Some bleeding  Follow up prn

## 2012-12-15 ENCOUNTER — Encounter: Payer: Self-pay | Admitting: Adult Health

## 2012-12-15 ENCOUNTER — Ambulatory Visit (INDEPENDENT_AMBULATORY_CARE_PROVIDER_SITE_OTHER): Payer: BC Managed Care – PPO | Admitting: Adult Health

## 2012-12-15 VITALS — BP 102/78 | Ht 62.0 in | Wt 181.8 lb

## 2012-12-15 DIAGNOSIS — B9689 Other specified bacterial agents as the cause of diseases classified elsewhere: Secondary | ICD-10-CM

## 2012-12-15 DIAGNOSIS — N760 Acute vaginitis: Secondary | ICD-10-CM

## 2012-12-15 DIAGNOSIS — N898 Other specified noninflammatory disorders of vagina: Secondary | ICD-10-CM

## 2012-12-15 DIAGNOSIS — L293 Anogenital pruritus, unspecified: Secondary | ICD-10-CM

## 2012-12-15 DIAGNOSIS — A499 Bacterial infection, unspecified: Secondary | ICD-10-CM

## 2012-12-15 HISTORY — DX: Other specified noninflammatory disorders of vagina: N89.8

## 2012-12-15 HISTORY — DX: Other specified bacterial agents as the cause of diseases classified elsewhere: B96.89

## 2012-12-15 LAB — POCT WET PREP (WET MOUNT)

## 2012-12-15 LAB — POCT URINALYSIS DIPSTICK
Blood, UA: NEGATIVE
Nitrite, UA: NEGATIVE
Protein, UA: NEGATIVE

## 2012-12-15 MED ORDER — METRONIDAZOLE 500 MG PO TABS: 500.0000 mg | ORAL_TABLET | Freq: Two times a day (BID) | ORAL | Status: DC

## 2012-12-15 NOTE — Progress Notes (Signed)
Subjective:     Patient ID: Candace [name], female   DOB: [birth year], 27 y.o.   MRN: [id number]  HPI Christinea is in complaining of vaginal itching and she has the nuva ring in place,she had a D&C 11/22/12 and was treated for chlamydia 11/16/12.  Review of Systems Positives in HPI Reviewed past medical,surgical, social and family history. Reviewed medications and allergies.     Objective:   Physical Exam BP 102/78  Ht 5\' 2"  (1.575 m)  Wt 181 lb 12 oz (82.441 kg)  BMI 33.23 kg/m2   urine negative Skin warm and dry.Pelvic: external genitalia is normal in appearance, vagina: white discharge with odor, nuva ring in place,cervix:smooth and bulbous, uterus: normal size, shape and contour, non tender, no masses felt, adnexa: no masses or tenderness noted. Wet prep: + for clue cells and +WBCs. GC/CHL obtained.  Assessment:     Vaginal itch  BV    Plan:     Rx flagyl 500 mg 1 bid x 7 days, no alcohol, review handout on BV   Follow up prn

## 2012-12-15 NOTE — Patient Instructions (Addendum)
Bacterial Vaginosis Bacterial vaginosis (BV) is a vaginal infection where the normal balance of bacteria in the vagina is disrupted. The normal balance is then replaced by an overgrowth of certain bacteria. There are several different kinds of bacteria that can cause BV. BV is the most common vaginal infection in women of childbearing age. CAUSES   The cause of BV is not fully understood. BV develops when there is an increase or imbalance of harmful bacteria.  Some activities or behaviors can upset the normal balance of bacteria in the vagina and put women at increased risk including:  Having a new sex partner or multiple sex partners.  Douching.  Using an intrauterine device (IUD) for contraception.  It is not clear what role sexual activity plays in the development of BV. However, women that have never had sexual intercourse are rarely infected with BV. Women do not get BV from toilet seats, bedding, swimming pools or from touching objects around them.  SYMPTOMS   Grey vaginal discharge.  A fish-like odor with discharge, especially after sexual intercourse.  Itching or burning of the vagina and vulva.  Burning or pain with urination.  Some women have no signs or symptoms at all. DIAGNOSIS  Your caregiver must examine the vagina for signs of BV. Your caregiver will perform lab tests and look at the sample of vaginal fluid through a microscope. They will look for bacteria and abnormal cells (clue cells), a pH test higher than 4.5, and a positive amine test all associated with BV.  RISKS AND COMPLICATIONS   Pelvic inflammatory disease (PID).  Infections following gynecology surgery.  Developing HIV.  Developing herpes virus. TREATMENT  Sometimes BV will clear up without treatment. However, all women with symptoms of BV should be treated to avoid complications, especially if gynecology surgery is planned. Female partners generally do not need to be treated. However, BV may spread  between female sex partners so treatment is helpful in preventing a recurrence of BV.   BV may be treated with antibiotics. The antibiotics come in either pill or vaginal cream forms. Either can be used with nonpregnant or pregnant women, but the recommended dosages differ. These antibiotics are not harmful to the baby.  BV can recur after treatment. If this happens, a second round of antibiotics will often be prescribed.  Treatment is important for pregnant women. If not treated, BV can cause a premature delivery, especially for a pregnant woman who had a premature birth in the past. All pregnant women who have symptoms of BV should be checked and treated.  For chronic reoccurrence of BV, treatment with a type of prescribed gel vaginally twice a week is helpful. HOME CARE INSTRUCTIONS   Finish all medication as directed by your caregiver.  Do not have sex until treatment is completed.  Tell your sexual partner that you have a vaginal infection. They should see their caregiver and be treated if they have problems, such as a mild rash or itching.  Practice safe sex. Use condoms. Only have 1 sex partner. PREVENTION  Basic prevention steps can help reduce the risk of upsetting the natural balance of bacteria in the vagina and developing BV:  Do not have sexual intercourse (be abstinent).  Do not douche.  Use all of the medicine prescribed for treatment of BV, even if the signs and symptoms go away.  Tell your sex partner if you have BV. That way, they can be treated, if needed, to prevent reoccurrence. SEEK MEDICAL CARE IF:     Your symptoms are not improving after 3 days of treatment.  You have increased discharge, pain, or fever. MAKE SURE YOU:   Understand these instructions.  Will watch your condition.  Will get help right away if you are not doing well or get worse. FOR MORE INFORMATION  Division of STD Prevention (DSTDP), Centers for Disease Control and Prevention:  www.cdc.gov/std American [employer] Health Association (ASHA): www.ashastd.org  Document Released: 04/28/2005 Document Revised: 07/21/2011 Document Reviewed: 10/19/2008 Exit[employer] Patient Information 2014 [employer]. Take flagyl no alcohol  Follow up prn 

## 2012-12-16 LAB — GC/CHLAMYDIA PROBE AMP: GC Probe RNA: NEGATIVE

## 2013-08-08 ENCOUNTER — Telehealth: Payer: Self-pay | Admitting: *Deleted

## 2013-08-08 NOTE — Telephone Encounter (Signed)
Pt states has nuvaring thinks it is "stuck in vaginal area" has started her period, no c/o pain. Appt made for tomorrow for evaluation.

## 2013-08-10 ENCOUNTER — Ambulatory Visit: Payer: BC Managed Care – PPO | Admitting: Advanced Practice Midwife

## 2013-08-15 ENCOUNTER — Encounter: Payer: Self-pay | Admitting: Women's Health

## 2013-08-15 ENCOUNTER — Ambulatory Visit (INDEPENDENT_AMBULATORY_CARE_PROVIDER_SITE_OTHER): Payer: BC Managed Care – PPO | Admitting: Women's Health

## 2013-08-15 VITALS — BP 124/70 | Ht 63.0 in | Wt 181.5 lb

## 2013-08-15 DIAGNOSIS — Z3009 Encounter for other general counseling and advice on contraception: Secondary | ICD-10-CM

## 2013-08-15 DIAGNOSIS — Z3049 Encounter for surveillance of other contraceptives: Secondary | ICD-10-CM

## 2013-08-15 DIAGNOSIS — Z3202 Encounter for pregnancy test, result negative: Secondary | ICD-10-CM

## 2013-08-15 LAB — POCT URINE PREGNANCY: PREG TEST UR: NEGATIVE

## 2013-08-15 NOTE — Patient Instructions (Signed)
Levonorgestrel intrauterine device (IUD)--Mirena What is this medicine? LEVONORGESTREL IUD (LEE voe nor jes trel) is a contraceptive (birth control) device. The device is placed inside the uterus by a healthcare professional. It is used to prevent pregnancy and can also be used to treat heavy bleeding that occurs during your period. Depending on the device, it can be used for 3 to 5 years. This medicine may be used for other purposes; ask your health care provider or pharmacist if you have questions. COMMON BRAND NAME(S): Mirena, Skyla What should I tell my health care provider before I take this medicine? They need to know if you have any of these conditions: -abnormal Pap smear -cancer of the breast, uterus, or cervix -diabetes -endometritis -genital or pelvic infection now or in the past -have more than one sexual partner or your partner has more than one partner -heart disease -history of an ectopic or tubal pregnancy -immune system problems -IUD in place -liver disease or tumor -problems with blood clots or take blood-thinners -use intravenous drugs -uterus of unusual shape -vaginal bleeding that has not been explained -an unusual or allergic reaction to levonorgestrel, other hormones, silicone, or polyethylene, medicines, foods, dyes, or preservatives -pregnant or trying to get pregnant -breast-feeding How should I use this medicine? This device is placed inside the uterus by a health care professional. Talk to your pediatrician regarding the use of this medicine in children. Special care may be needed. Overdosage: If you think you have taken too much of this medicine contact a poison control center or emergency room at once. NOTE: This medicine is only for you. Do not share this medicine with others. What if I miss a dose? This does not apply. What may interact with this medicine? Do not take this medicine with any of the following  medications: -amprenavir -bosentan -fosamprenavir This medicine may also interact with the following medications: -aprepitant -barbiturate medicines for inducing sleep or treating seizures -bexarotene -griseofulvin -medicines to treat seizures like carbamazepine, ethotoin, felbamate, oxcarbazepine, phenytoin, topiramate -modafinil -pioglitazone -rifabutin -rifampin -rifapentine -some medicines to treat HIV infection like atazanavir, indinavir, lopinavir, nelfinavir, tipranavir, ritonavir -St. John's wort -warfarin This list may not describe all possible interactions. Give your health care provider a list of all the medicines, herbs, non-prescription drugs, or dietary supplements you use. Also tell them if you smoke, drink alcohol, or use illegal drugs. Some items may interact with your medicine. What should I watch for while using this medicine? Visit your doctor or health care professional for regular check ups. See your doctor if you or your partner has sexual contact with others, becomes HIV positive, or gets a sexual transmitted disease. This product does not protect you against HIV infection (AIDS) or other sexually transmitted diseases. You can check the placement of the IUD yourself by reaching up to the top of your vagina with clean fingers to feel the threads. Do not pull on the threads. It is a good habit to check placement after each menstrual period. Call your doctor right away if you feel more of the IUD than just the threads or if you cannot feel the threads at all. The IUD may come out by itself. You may become pregnant if the device comes out. If you notice that the IUD has come out use a backup birth control method like condoms and call your health care provider. Using tampons will not change the position of the IUD and are okay to use during your period. What side effects may I   notice from receiving this medicine? Side effects that you should report to your doctor or  health care professional as soon as possible: -allergic reactions like skin rash, itching or hives, swelling of the face, lips, or tongue -fever, flu-like symptoms -genital sores -high blood pressure -no menstrual period for 6 weeks during use -pain, swelling, warmth in the leg -pelvic pain or tenderness -severe or sudden headache -signs of pregnancy -stomach cramping -sudden shortness of breath -trouble with balance, talking, or walking -unusual vaginal bleeding, discharge -yellowing of the eyes or skin Side effects that usually do not require medical attention (report to your doctor or health care professional if they continue or are bothersome): -acne -breast pain -change in sex drive or performance -changes in weight -cramping, dizziness, or faintness while the device is being inserted -headache -irregular menstrual bleeding within first 3 to 6 months of use -nausea This list may not describe all possible side effects. Call your doctor for medical advice about side effects. You may report side effects to FDA at 1-800-FDA-1088. Where should I keep my medicine? This does not apply. NOTE: This sheet is a summary. It may not cover all possible information. If you have questions about this medicine, talk to your doctor, pharmacist, or health care provider.  2014, Elsevier/Gold Standard. (2011-05-29 13:54:04)  

## 2013-08-15 NOTE — Progress Notes (Signed)
Patient ID: Candace Candace [name], female   DOB: Jul 24, 1985, 28 y.o.   MRN: [id number]   [facility]  Patient name: Candace Candace [name] Caprio MRN [id number]  Date of birth: Jul 24, 1985  CC & HPI:  Candace Candace [name] is Candace [name] 28 y.o. African American female presenting today w/ report of not being able to locate nuva ring that she placed 2/28. Was supposed to come out ~3/21. States her period started Candace [name] week later than it has been. Patient's last menstrual period was 08/07/2013. Would like to change methods.   Pertinent History Reviewed:  Medical & Surgical Hx:   Past Medical History  Diagnosis Date  . Anemia   . Eczema   . Allergy   . History of chlamydia   . History of gonorrhea   . Irregular heartbeat   . BV (bacterial vaginosis) 12/15/2012  . Vaginal itching 12/15/2012   Past Surgical History  Procedure Laterality Date  . No past surgeries    . Dilation and curettage of uterus N/Candace [name] 11/22/2012    Procedure: SUCTION DILATATION AND CURETTAGE;  Surgeon: Lazaro ArmsLuther H Eure, MD;  Location: [facility];  Service: Gynecology;  Laterality: N/Candace [name];   Medications: Reviewed & Updated - see associated section Social History: Reviewed -  reports that she has never smoked. She has never used smokeless tobacco.  Objective Findings:  Vitals: BP 124/70  Ht 5\' 3"  (1.6 m)  Wt 181 lb 8 oz (82.328 kg)  BMI 32.16 kg/m2  LMP 08/07/2013  Physical Examination: General appearance - alert, well appearing, and in no distress Pelvic - Nuva Ring not palpated w/ digital exam, graves speculum inserted- Nuva Ring not seen  Results for orders placed in visit on 08/15/13 (from the past 24 hour(s))  POCT URINE PREGNANCY   Collection Time    08/15/13  2:47 PM      Result Value Ref Range   Preg Test, Ur Negative       Assessment & Plan:  Candace [name]:   Nuva Ring likely fell out on its own or during sexual intercourse- not currently in vagina  Neg preg test today P:  Wants to switch methods, discussed options- would like mirena,  pamphlet given  Reports being unable to remain abstinent until placement, will place another nuva ring for    contraception, remove in 3wks and call us when period starts for mirena insertion  Needs pap in June  Marge DuncansBooker, Keyonta Randall CNM, [facility] 08/15/2013 3:01 PM

## 2013-09-27 ENCOUNTER — Encounter: Payer: Self-pay | Admitting: Women's Health

## 2013-09-27 ENCOUNTER — Ambulatory Visit (INDEPENDENT_AMBULATORY_CARE_PROVIDER_SITE_OTHER): Payer: BC Managed Care – PPO | Admitting: Women's Health

## 2013-09-27 VITALS — BP 98/70 | Ht 63.0 in | Wt 190.2 lb

## 2013-09-27 DIAGNOSIS — Z3202 Encounter for pregnancy test, result negative: Secondary | ICD-10-CM

## 2013-09-27 DIAGNOSIS — Z30430 Encounter for insertion of intrauterine contraceptive device: Secondary | ICD-10-CM

## 2013-09-27 LAB — POCT URINE PREGNANCY: Preg Test, Ur: NEGATIVE

## 2013-09-27 NOTE — Progress Notes (Signed)
Patient ID: Candace [name], female   DOB: 27-Dec-1985, 28 y.o.   MRN: [id number] Candace [name] is a 28 y.o. year old 593P2012 African American female who presents for placement of a Mirena IUD.  Patient's last menstrual period was 09/23/2013. BP 98/70  Ht 5\' 3"  (1.6 m)  Wt 190 lb 3.2 oz (86.274 kg)  BMI 33.70 kg/m2  LMP 09/23/2013 She is on her period that began 5/15, and pregnancy test today was neg  The risks and benefits of the method and placement have been thouroughly reviewed with the patient and all questions were answered.  Specifically the patient is aware of failure rate of 05/998, expulsion of the IUD and of possible perforation.  The patient is aware of irregular bleeding due to the method and understands the incidence of irregular bleeding diminishes with time.  Signed copy of informed consent in chart.   Time out was performed.  A graves speculum was placed in the vagina.  The cervix was visualized, prepped using Betadine, and grasped with a single tooth tenaculum. The uterus was found to be anteroflexed and it sounded to 8 cm.  Mirena IUD placed per manufacturer's recommendations.   The strings were trimmed to 3 cm.  Sonogram was performed and the proper placement of the IUD was verified via transvaginal u/s.   The patient was given post procedure instructions, including signs and symptoms of infection and to check for the strings after each menses or each month, and refraining from intercourse or anything in the vagina for 3 days.  She was given a Mirena care card with date Mirena placed, and date Mirena to be removed.  She is scheduled for a pap & physical appointment in 4 weeks.  Marge DuncansKimberly Randall [name] CNM, [facility]NP-BC 09/27/2013 12:13 PM

## 2013-09-27 NOTE — Patient Instructions (Signed)
 Nothing in vagina for 3 days (no sex, douching, tampons, etc...)  Check your strings once a month to make sure you can feel them, if you are not able to please let us know  If you develop a fever of 100.4 or more in the next few weeks, or if you develop severe abdominal pain, please let us know  Use a backup method of birth control, such as condoms, for 2 weeks   Intrauterine Device Insertion, Care After Refer to this sheet in the next few weeks. These instructions provide you with information on caring for yourself after your procedure. Your health care provider may also give you more specific instructions. Your treatment has been planned according to current medical practices, but problems sometimes occur. Call your health care provider if you have any problems or questions after your procedure. WHAT TO EXPECT AFTER THE PROCEDURE Insertion of the IUD may cause some discomfort, such as cramping. The cramping should improve after the IUD is in place. You may have bleeding after the procedure. This is normal. It varies from light spotting for a few days to menstrual-like bleeding. When the IUD is in place, a string will extend past the cervix into the vagina for 1 2 inches. The strings should not bother you or your partner. If they do, talk to your health care provider.  HOME CARE INSTRUCTIONS   Check your intrauterine device (IUD) to make sure it is in place before you resume sexual activity. You should be able to feel the strings. If you cannot feel the strings, something may be wrong. The IUD may have fallen out of the uterus, or the uterus may have been punctured (perforated) during placement. Also, if the strings are getting longer, it may mean that the IUD is being forced out of the uterus. You no longer have full protection from pregnancy if any of these problems occur.  You may resume sexual intercourse if you are not having problems with the IUD. The copper IUD is considered immediately  effective, and the hormone IUD works right away if inserted within 7 days of your period starting. You will need to use a backup method of birth control for 7 days if the IUD in inserted at any other time in your cycle.  Continue to check that the IUD is still in place by feeling for the strings after every menstrual period.  You may need to take pain medicine such as acetaminophen or ibuprofen. Only take medicines as directed by your health care provider. SEEK MEDICAL CARE IF:   You have bleeding that is heavier or lasts longer than a normal menstrual cycle.  You have a fever.  You have increasing cramps or abdominal pain not relieved with medicine.  You have abdominal pain that does not seem to be related to the same area of earlier cramping and pain.  You are lightheaded, unusually weak, or faint.  You have abnormal vaginal discharge or smells.  You have pain during sexual intercourse.  You cannot feel the IUD strings, or the IUD string has gotten longer.  You feel the IUD at the opening of the cervix in the vagina.  You think you are pregnant, or you miss your menstrual period.  The IUD string is hurting your sex partner. MAKE SURE YOU:  Understand these instructions.  Will watch your condition.  Will get help right away if you are not doing well or get worse. Document Released: 12/25/2010 Document Revised: 02/16/2013 Document Reviewed: 10/17/2012   ExitCare Patient Information 2014 [employer]  

## 2013-10-25 ENCOUNTER — Other Ambulatory Visit (HOSPITAL_COMMUNITY)
Admission: RE | Admit: 2013-10-25 | Discharge: 2013-10-25 | Disposition: A | Discharge status: Home / Self Care | Payer: BC Managed Care – PPO | Source: Ambulatory Visit | Attending: Obstetrics and Gynecology | Admitting: Obstetrics and Gynecology

## 2013-10-25 ENCOUNTER — Ambulatory Visit (INDEPENDENT_AMBULATORY_CARE_PROVIDER_SITE_OTHER): Payer: BC Managed Care – PPO | Admitting: Women's Health

## 2013-10-25 ENCOUNTER — Encounter: Payer: Self-pay | Admitting: Women's Health

## 2013-10-25 VITALS — BP 104/72 | Ht 62.25 in | Wt 196.0 lb

## 2013-10-25 DIAGNOSIS — Z01419 Encounter for gynecological examination (general) (routine) without abnormal findings: Secondary | ICD-10-CM

## 2013-10-25 DIAGNOSIS — Z1151 Encounter for screening for human papillomavirus (HPV): Secondary | ICD-10-CM | POA: Insufficient documentation

## 2013-10-25 DIAGNOSIS — Z124 Encounter for screening for malignant neoplasm of cervix: Secondary | ICD-10-CM | POA: Insufficient documentation

## 2013-10-25 DIAGNOSIS — Z113 Encounter for screening for infections with a predominantly sexual mode of transmission: Secondary | ICD-10-CM | POA: Insufficient documentation

## 2013-10-25 DIAGNOSIS — R87810 Cervical high risk human papillomavirus (HPV) DNA test positive: Secondary | ICD-10-CM | POA: Insufficient documentation

## 2013-10-25 NOTE — Progress Notes (Signed)
Patient ID: Candace [name], female   DOB: [birth year], 28 y.o.   MRN: [id number] Subjective:   Candace [name] is a 28 y.o. 613P2012 African American female here for a routine well-woman exam.  Patient's last menstrual period was 10/21/2013.    Current complaints: none, loves her Mirena! PCP: [place]. Jeanice [place], [place]       Does not desire labs  The following portions of the patient's history were reviewed and updated as appropriate: allergies, current medications, past family history, past medical history, past social history, past surgical history and problem list.  Past Medical History Past Medical History  Diagnosis Date  . Anemia   . Eczema   . Allergy   . History of chlamydia   . History of gonorrhea   . Irregular heartbeat   . BV (bacterial vaginosis) 12/15/2012  . Vaginal itching 12/15/2012    Past Surgical History Past Surgical History  Procedure Laterality Date  . No past surgeries    . Dilation and curettage of uterus N/A 11/22/2012    Procedure: SUCTION DILATATION AND CURETTAGE;  Surgeon: Lazaro ArmsLuther H Eure, MD;  Location: [facility];  Service: Gynecology;  Laterality: N/A;    Gynecologic History [id number]  Patient's last menstrual period was 10/21/2013. Contraception: IUD- Mirena Last Pap: 2013. Results were: normal Last mammogram: never. Results were: n/a Last TCS: never  Obstetric History OB History  Gravida Para Term Preterm AB SAB TAB Ectopic Multiple Living  3 2 2  1 1    2     # Outcome Date GA Lbr Len/2nd Weight Sex Delivery Anes PTL Lv  3 TRM 01/26/08 968w1d  7 lb 9 oz (3.43 kg) F SVD EPI  Y  2 TRM 11/24/05 5531w0d  7 lb 2 oz (3.232 kg) M SVD EPI  Y  1 SAB               Current Medications Current Outpatient Prescriptions on File Prior to Visit  Medication Sig Dispense Refill  . levonorgestrel (MIRENA) 20 MCG/24HR IUD 1 each by Intrauterine route once.       No current facility-administered medications on file prior to visit.    Review of Systems Patient  denies any headaches, blurred vision, shortness of breath, chest pain, abdominal pain, problems with bowel movements, urination, or intercourse.  Objective:  BP 104/72  Ht 5' 2.25" (1.581 m)  Wt 196 lb (88.905 kg)  BMI 35.57 kg/m2  LMP 10/21/2013 Physical Exam  General:  Well developed, well nourished, no acute distress. She is alert and oriented x3. Skin:  Warm and dry Neck:  Midline trachea, no thyromegaly or nodules Cardiovascular: Regular rate and rhythm, no murmur heard Lungs:  Effort normal, all lung fields clear to auscultation bilaterally Breasts:  No dominant palpable mass, retraction, or nipple discharge Abdomen:  Soft, non tender, no hepatosplenomegaly or masses Pelvic:  External genitalia is normal in appearance.  The vagina is normal in appearance. The cervix is bulbous, no CMT.  Thin prep pap is done w/ reflex HR HPV cotesting. Uterus is felt to be normal size, shape, and contour.  No adnexal masses or tenderness noted. Extremities:  No swelling or varicosities noted Psych:  She has a normal mood and affect  Assessment:   Healthy well-woman exam  Plan:   F/U 2793yr for physical, or sooner if needed Mammogram @ 28yo or sooner if problems Colonoscopy @28yo  or sooner if problems  Marge DuncansBooker, Eraina Randall CNM, [facility]NP[facility]BC 10/25/2013 9:14 AM

## 2013-10-26 LAB — CYTOLOGY - PAP

## 2013-10-31 ENCOUNTER — Telehealth: Payer: Self-pay | Admitting: Women's Health

## 2013-10-31 NOTE — Telephone Encounter (Signed)
Pt states got IUD placement x 1 month ago. C/o irregular bleeding since. Pt informed can have abnormal vaginal bleeding for several months after starting new birth control. Pt to continue to monitor vaginal bleeding if worsens call office back. Pt to eat foods high in iron and can take an iron supplement due to history of anemia.

## 2013-11-01 ENCOUNTER — Encounter: Payer: Self-pay | Admitting: Women's Health

## 2013-11-01 ENCOUNTER — Telehealth: Payer: Self-pay | Admitting: Women's Health

## 2013-11-01 DIAGNOSIS — R87810 Cervical high risk human papillomavirus (HPV) DNA test positive: Secondary | ICD-10-CM

## 2013-11-01 DIAGNOSIS — R87610 Atypical squamous cells of undetermined significance on cytologic smear of cervix (ASC-US): Secondary | ICD-10-CM

## 2013-11-01 DIAGNOSIS — R87619 Unspecified abnormal cytological findings in specimens from cervix uteri: Secondary | ICD-10-CM | POA: Insufficient documentation

## 2013-11-01 NOTE — Telephone Encounter (Signed)
Notified Cala BradfordKimberly of abnormal pap and need for colpo, switched to front to schedule. Questions answered.  Cheral MarkerKimberly R. Booker, CNM, [facility] 11/01/2013 9:16 AM

## 2013-11-02 ENCOUNTER — Telehealth: Payer: Self-pay | Admitting: Women's Health

## 2013-11-02 MED ORDER — MEGESTROL ACETATE 40 MG PO TABS: ORAL_TABLET | ORAL | Status: DC

## 2013-11-02 NOTE — Telephone Encounter (Signed)
Pt states IUD inserted by Joellyn HaffKim Booker, CNM on 09/27/2013, vaginal bleeding x 12 days. Pt states was told to call for "pills to stop bleeding if needed."

## 2013-11-17 ENCOUNTER — Encounter: Payer: Self-pay | Admitting: Obstetrics and Gynecology

## 2013-11-17 ENCOUNTER — Ambulatory Visit (INDEPENDENT_AMBULATORY_CARE_PROVIDER_SITE_OTHER): Payer: BC Managed Care – PPO | Admitting: Obstetrics and Gynecology

## 2013-11-17 VITALS — BP 100/60 | Ht 62.0 in | Wt 196.0 lb

## 2013-11-17 DIAGNOSIS — Z3200 Encounter for pregnancy test, result unknown: Secondary | ICD-10-CM

## 2013-11-17 DIAGNOSIS — R87610 Atypical squamous cells of undetermined significance on cytologic smear of cervix (ASC-US): Secondary | ICD-10-CM

## 2013-11-17 DIAGNOSIS — Z3202 Encounter for pregnancy test, result negative: Secondary | ICD-10-CM

## 2013-11-17 LAB — POCT URINE PREGNANCY: PREG TEST UR: NEGATIVE

## 2013-11-17 NOTE — Patient Instructions (Signed)

## 2013-11-17 NOTE — Progress Notes (Signed)
This chart was scribed by Leone PayorSonum Patel, Medical Scribe, for Dr. Christin BachJohn Earleen Aoun on 11/17/13 at 11:27 AM. This chart was reviewed by Dr. Christin BachJohn Jearline Hirschhorn for accuracy.    Candace HalesKimberly A [name] 28 y.o. [id number] here for colposcopy for ASCUS with POSITIVE high risk HPV pap smear on 10/25/13.  Discussed role for HPV in cervical dysplasia, need for surveillance.  Patient given informed consent, signed copy in the chart, time out was performed.  Placed in lithotomy position. Cervix viewed with speculum and colposcope after application of acetic acid.   Colposcopy adequate? Yes  no visible lesions; biopsies were not obtained.  ECC specimen was not obtained. All specimens were labelled and sent to pathology.  Colposcopy IMPRESSION: colposcopy adequate but biopsies not obtained   Patient was given post procedure instructions. Will follow up in 1 year with COTESTING.  Routine preventative health maintenance measures emphasized.

## 2014-01-12 ENCOUNTER — Inpatient Hospital Stay (HOSPITAL_COMMUNITY)
Admission: AD | Admit: 2014-01-12 | Discharge: 2014-01-12 | Disposition: A | Discharge status: Home / Self Care | Payer: BC Managed Care – PPO | Source: Ambulatory Visit | Attending: Obstetrics & Gynecology | Admitting: Obstetrics & Gynecology

## 2014-01-12 ENCOUNTER — Encounter (HOSPITAL_COMMUNITY): Payer: Self-pay | Admitting: *Deleted

## 2014-01-12 DIAGNOSIS — N949 Unspecified condition associated with female genital organs and menstrual cycle: Secondary | ICD-10-CM | POA: Diagnosis present

## 2014-01-12 DIAGNOSIS — Z30431 Encounter for routine checking of intrauterine contraceptive device: Secondary | ICD-10-CM | POA: Diagnosis not present

## 2014-01-12 DIAGNOSIS — N946 Dysmenorrhea, unspecified: Secondary | ICD-10-CM | POA: Diagnosis not present

## 2014-01-12 DIAGNOSIS — N925 Other specified irregular menstruation: Secondary | ICD-10-CM | POA: Diagnosis present

## 2014-01-12 HISTORY — DX: Headache: R51

## 2014-01-12 LAB — WET PREP, GENITAL
Trich, Wet Prep: NONE SEEN
Yeast Wet Prep HPF POC: NONE SEEN

## 2014-01-12 LAB — POCT PREGNANCY, URINE: Preg Test, Ur: NEGATIVE

## 2014-01-12 LAB — CBC
HEMATOCRIT: 36.8 % (ref 36.0–46.0)
Hemoglobin: 12.1 g/dL (ref 12.0–15.0)
MCH: 27.7 pg (ref 26.0–34.0)
MCHC: 32.9 g/dL (ref 30.0–36.0)
MCV: 84.2 fL (ref 78.0–100.0)
Platelets: 236 10*3/uL (ref 150–400)
RBC: 4.37 MIL/uL (ref 3.87–5.11)
RDW: 15 % (ref 11.5–15.5)
WBC: 6.3 10*3/uL (ref 4.0–10.5)

## 2014-01-12 MED ORDER — MEGESTROL ACETATE 40 MG PO TABS: ORAL_TABLET | ORAL | Status: DC

## 2014-01-12 NOTE — MAU Provider Note (Signed)
History     CSN: [id number]  Arrival date and time: 01/12/14 1728   First Provider Initiated Contact with Patient 01/12/14 1830      Chief Complaint  Patient presents with  . Vaginal Bleeding   HPI Candace [name] 28 y.o. [id number] presents to MAU complaining of heavy bleeding since mid July.  She had a Mirena IUD mid May which led to heavy bleeding until using Megace.  She used that medication for 3-4 weeks and did have improvement in bleeding.  Shortly after stopping the medication, her periods became worse again and she has not seen improvement. Overall, she does not feel terrible, but is frustrated with excessive bleeding.   She called the doctor's office and was told to come to MAU because she has a concomittant headache this afternoon.  She does have a history of migraine headaches.   OB History   Grav Para Term Preterm Abortions TAB SAB Ect Mult Living   Past Medical History  Diagnosis Date  . Anemia   . Eczema   . Allergy   . History of chlamydia   . History of gonorrhea   . Irregular heartbeat   . BV (bacterial vaginosis) 12/15/2012  . Vaginal itching 12/15/2012  . Headache(784.0)   . Eczema   . Vaginal Pap smear, abnormal     f/u ok    Past Surgical History  Procedure Laterality Date  . Dilation and curettage of uterus N/A 11/22/2012    Procedure: SUCTION DILATATION AND CURETTAGE;  Surgeon: Lazaro Arms, MD;  Location: [facility];  Service: Gynecology;  Laterality: N/A;    Family History  Problem Relation Age of Onset  . Cancer Mother     brain  . Heart disease Mother   . Cancer Maternal Aunt     Multiple with Breast Cancer  . Hypertension Other   . Diabetes Other   . Other Other     stomach tumors  . Heart disease Other     her mom, gma and aunts have arrythmia  . Kidney disease Paternal Grandfather   . Cancer Paternal Grandmother     breast, lung  . Kidney disease Paternal Grandmother   . Drug abuse Paternal Grandmother   . Heart  disease Maternal Grandfather   . Heart disease Paternal Aunt   . Kidney disease Paternal Aunt   . Drug abuse Paternal Aunt   . Stroke Paternal Aunt   . Heart disease Paternal Uncle   . Stroke Paternal Uncle     History  Substance Use Topics  . Smoking status: Never Smoker   . Smokeless tobacco: Never Used  . Alcohol Use: No     Comment: socially    Allergies:  Allergies  Allergen Reactions  . Hydrocodone     violent  . Vicodin [Hydrocodone-Acetaminophen] Other (See Comments)    violent  . Latex Itching and Rash    Prescriptions prior to admission  Medication Sig Dispense Refill  . IRON PO Take 1 tablet by mouth as needed (when "feeling weak").      Marland Kitchen levonorgestrel (MIRENA) 20 MCG/24HR IUD 1 each by Intrauterine route once.        Review of Systems  Constitutional: Negative for fever, chills and diaphoresis.  HENT: Negative for congestion and sore throat.   Eyes: Negative for blurred vision and double vision.  Respiratory: Positive for cough. Negative for shortness of breath  and wheezing.   Cardiovascular: Negative for chest pain and palpitations.  Gastrointestinal: Positive for abdominal pain. Negative for heartburn, nausea and vomiting.  Genitourinary: Negative for dysuria and frequency.  Skin: Negative for itching and rash.  Neurological: Positive for dizziness and headaches. Negative for tingling and weakness.  Psychiatric/Behavioral: Negative for depression, suicidal ideas and substance abuse. The patient is not nervous/anxious.    Physical Exam   Blood pressure 127/81, pulse 95, temperature 98.6 F (37 C), temperature source Oral, resp. rate 16, height 5' 2.5" (1.588 m), weight 87.726 kg (193 lb 6.4 oz), SpO2 99.00%.  Physical Exam  Constitutional: She is oriented to person, place, and time. She appears well-developed and well-nourished.  HENT:  Head: Normocephalic.  Eyes: EOM are normal.  Neck: Normal range of motion.  Cardiovascular: Normal rate and  regular rhythm.   Respiratory: Effort normal and breath sounds normal.  GI: Soft. Bowel sounds are normal. She exhibits no distension. There is no tenderness.  Genitourinary:  Small amt of dark red blood in vagina.  No pooling noted.   IUD strings visible in cervical os.  No CMT/adnexal tenderness or mass.    Musculoskeletal: Normal range of motion.  Neurological: She is alert and oriented to person, place, and time.  Skin: Skin is warm and dry.  Psychiatric: She has a normal mood and affect.   Results for orders placed during the hospital encounter of 01/12/14 (from the past 24 hour(s))  POCT PREGNANCY, URINE     Status: None   Collection Time    01/12/14  6:28 PM      Result Value Ref Range   Preg Test, Ur NEGATIVE  NEGATIVE  WET PREP, GENITAL     Status: Abnormal   Collection Time    01/12/14  6:40 PM      Result Value Ref Range   Yeast Wet Prep HPF POC NONE SEEN  NONE SEEN   Trich, Wet Prep NONE SEEN  NONE SEEN   Clue Cells Wet Prep HPF POC FEW (*) NONE SEEN   WBC, Wet Prep HPF POC FEW (*) NONE SEEN  CBC     Status: None   Collection Time    01/12/14  6:44 PM      Result Value Ref Range   WBC 6.3  4.0 - 10.5 K/uL   RBC 4.37  3.87 - 5.11 MIL/uL   Hemoglobin 12.1  12.0 - 15.0 g/dL   HCT 16.1  09.6 - 04.5 %   MCV 84.2  78.0 - 100.0 fL   MCH 27.7  26.0 - 34.0 pg   MCHC 32.9  30.0 - 36.0 g/dL   RDW 40.9  81.1 - 91.4 %   Platelets 236  150 - 400 K/uL     MAU Course  Procedures none MDM Bleeding with IUD - previously improved on megace  Assessment and Plan  A: dysmenorrhea with IUD in place  P: discharge to home Megace  - 3 tabs po x 3 days, 2 tabs x 2 days, 1 tab po prn Follow up in clinic for additional eval/treatment Return to MAU for emergency  Bertram Denver 01/12/2014, 6:30 PM

## 2014-01-12 NOTE — MAU Note (Signed)
Mirena placed end of May, started bleeding almost directly after it was placed, bleeding stopped for a wk when taking Megase. Finished Megase and started bleeding again.  Sometimes heavy, sometimes light.

## 2014-01-12 NOTE — Discharge Instructions (Signed)

## 2014-01-12 NOTE — MAU Note (Signed)
Patient states she had a Mirena IUD placed in May at [facility]. States she started bleeding one week after it was put in and continued to bleed until June and you were put on Megase. Took for at least 10 days and the bleeding stopped for only a few days and started again around 7-12. States she is changing 3-4 pads a day with an occasional light day. States she has cramping off and on. Feels tired and weak at times. Has been taking iron due to her history of anemia.

## 2014-01-13 LAB — HIV ANTIBODY (ROUTINE TESTING W REFLEX): HIV: NONREACTIVE

## 2014-01-13 LAB — GC/CHLAMYDIA PROBE AMP
CT PROBE, AMP APTIMA: NEGATIVE
GC Probe RNA: NEGATIVE

## 2014-03-13 ENCOUNTER — Encounter (HOSPITAL_COMMUNITY): Payer: Self-pay | Admitting: *Deleted

## 2014-08-24 ENCOUNTER — Inpatient Hospital Stay (HOSPITAL_COMMUNITY)
Admission: AD | Admit: 2014-08-24 | Discharge: 2014-08-24 | Disposition: A | Discharge status: Home / Self Care | Payer: BLUE CROSS/BLUE SHIELD | Source: Ambulatory Visit | Attending: Obstetrics & Gynecology | Admitting: Obstetrics & Gynecology

## 2014-08-24 ENCOUNTER — Encounter (HOSPITAL_COMMUNITY): Payer: Self-pay | Admitting: *Deleted

## 2014-08-24 DIAGNOSIS — Z3202 Encounter for pregnancy test, result negative: Secondary | ICD-10-CM | POA: Insufficient documentation

## 2014-08-24 DIAGNOSIS — R112 Nausea with vomiting, unspecified: Secondary | ICD-10-CM | POA: Insufficient documentation

## 2014-08-24 DIAGNOSIS — E860 Dehydration: Secondary | ICD-10-CM | POA: Diagnosis not present

## 2014-08-24 LAB — CBC WITH DIFFERENTIAL/PLATELET
Basophils Absolute: 0 10*3/uL (ref 0.0–0.1)
Basophils Relative: 0 % (ref 0–1)
Eosinophils Absolute: 0 10*3/uL (ref 0.0–0.7)
Eosinophils Relative: 0 % (ref 0–5)
HEMATOCRIT: 38.9 % (ref 36.0–46.0)
HEMOGLOBIN: 13 g/dL (ref 12.0–15.0)
LYMPHS ABS: 1 10*3/uL (ref 0.7–4.0)
Lymphocytes Relative: 14 % (ref 12–46)
MCH: 28.3 pg (ref 26.0–34.0)
MCHC: 33.4 g/dL (ref 30.0–36.0)
MCV: 84.6 fL (ref 78.0–100.0)
MONO ABS: 0.3 10*3/uL (ref 0.1–1.0)
MONOS PCT: 4 % (ref 3–12)
NEUTROS ABS: 5.6 10*3/uL (ref 1.7–7.7)
NEUTROS PCT: 82 % — AB (ref 43–77)
Platelets: 288 10*3/uL (ref 150–400)
RBC: 4.6 MIL/uL (ref 3.87–5.11)
RDW: 14.2 % (ref 11.5–15.5)
WBC: 6.9 10*3/uL (ref 4.0–10.5)

## 2014-08-24 LAB — COMPREHENSIVE METABOLIC PANEL
ALK PHOS: 68 U/L (ref 39–117)
ALT: 34 U/L (ref 0–35)
AST: 28 U/L (ref 0–37)
Albumin: 4.5 g/dL (ref 3.5–5.2)
Anion gap: 7 (ref 5–15)
BILIRUBIN TOTAL: 1.5 mg/dL — AB (ref 0.3–1.2)
BUN: 14 mg/dL (ref 6–23)
CHLORIDE: 105 mmol/L (ref 96–112)
CO2: 25 mmol/L (ref 19–32)
Calcium: 8.9 mg/dL (ref 8.4–10.5)
Creatinine, Ser: 0.79 mg/dL (ref 0.50–1.10)
GFR calc Af Amer: >90 mL/min (ref >90)
GFR calc non Af Amer: >90 mL/min (ref >90)
GLUCOSE: 81 mg/dL (ref 70–99)
Potassium: 3.7 mmol/L (ref 3.5–5.1)
SODIUM: 137 mmol/L (ref 135–145)
Total Protein: 7.3 g/dL (ref 6.0–8.3)

## 2014-08-24 LAB — URINALYSIS, ROUTINE W REFLEX MICROSCOPIC
Bilirubin Urine: NEGATIVE
GLUCOSE, UA: NEGATIVE mg/dL
HGB URINE DIPSTICK: NEGATIVE
Ketones, ur: 15 mg/dL — AB
Leukocytes, UA: NEGATIVE
NITRITE: NEGATIVE
PH: 6 (ref 5.0–8.0)
PROTEIN: NEGATIVE mg/dL
Urobilinogen, UA: 0.2 mg/dL (ref 0.0–1.0)

## 2014-08-24 LAB — LIPASE, BLOOD: LIPASE: 21 U/L (ref 11–59)

## 2014-08-24 LAB — AMYLASE: Amylase: 111 U/L — ABNORMAL HIGH (ref 0–105)

## 2014-08-24 LAB — POCT PREGNANCY, URINE: PREG TEST UR: NEGATIVE

## 2014-08-24 MED ORDER — PROMETHAZINE HCL 25 MG/ML IJ SOLN
25.0000 mg | Freq: Once | INTRAMUSCULAR | Status: AC
  Administered 2014-08-24: 25 mg via INTRAVENOUS
  Filled 2014-08-24: qty 1

## 2014-08-24 MED ORDER — PROMETHAZINE HCL 25 MG PO TABS: 12.5000 mg | ORAL_TABLET | Freq: Four times a day (QID) | ORAL | Status: DC | PRN

## 2014-08-24 NOTE — MAU Note (Signed)
Pt reports she has been having n/v x 2 months. Today cannot keep anything down. Has Myrena not sure if she is pregnat not taken pregnancy test.

## 2014-08-24 NOTE — Discharge Instructions (Signed)

## 2014-08-24 NOTE — MAU Provider Note (Signed)
History     CSN: [id number]  Arrival date and time: 08/24/14 1815   First Provider Initiated Contact with Patient 08/24/14 1922      Chief Complaint  Patient presents with  . Emesis   HPI  Candace [name] is a 29 y.o. [id number] who presents to MAU today with complaint of N/V x 2 months. She states N/V daily, sometimes worse than others. She states today she has not been able to tolerate anything PO including water. She has an IUD and states LMP was last month. She denies abdominal pain, fever, diarrhea or constipation.    OB History    Gravida Para Term Preterm AB TAB SAB Ectopic Multiple Living   Past Medical History  Diagnosis Date  . Anemia   . Eczema   . Allergy   . History of chlamydia   . History of gonorrhea   . Irregular heartbeat   . BV (bacterial vaginosis) 12/15/2012  . Vaginal itching 12/15/2012  . Headache(784.0)   . Eczema   . Vaginal Pap smear, abnormal     f/u ok    Past Surgical History  Procedure Laterality Date  . Dilation and curettage of uterus N/A 11/22/2012    Procedure: SUCTION DILATATION AND CURETTAGE;  Surgeon: Lazaro Arms, MD;  Location: [facility];  Service: Gynecology;  Laterality: N/A;    Family History  Problem Relation Age of Onset  . Cancer Mother     brain  . Heart disease Mother   . Cancer Maternal Aunt     Multiple with Breast Cancer  . Hypertension Other   . Diabetes Other   . Other Other     stomach tumors  . Heart disease Other     her mom, gma and aunts have arrythmia  . Kidney disease Paternal Grandfather   . Cancer Paternal Grandmother     breast, lung  . Kidney disease Paternal Grandmother   . Drug abuse Paternal Grandmother   . Heart disease Maternal Grandfather   . Heart disease Paternal Aunt   . Kidney disease Paternal Aunt   . Drug abuse Paternal Aunt   . Stroke Paternal Aunt   . Heart disease Paternal Uncle   . Stroke Paternal Uncle     History  Substance Use Topics  . Smoking  status: Never Smoker   . Smokeless tobacco: Never Used  . Alcohol Use: No     Comment: socially    Allergies:  Allergies  Allergen Reactions  . Hydrocodone     violent  . Vicodin [Hydrocodone-Acetaminophen] Other (See Comments)    violent  . Latex Itching and Rash    Prescriptions prior to admission  Medication Sig Dispense Refill Last Dose  . IRON PO Take 1 tablet by mouth daily as needed (when "feeling weak").    Past Month at Unknown time  . levonorgestrel (MIRENA) 20 MCG/24HR IUD 1 each by Intrauterine route once.   Continuous  . megestrol (MEGACE) 40 MG tablet 3 tabs po x 3 days Followed by 2 tabs daily x 2 days Followed by 1 tab daily as needed (Patient not taking: Reported on 08/24/2014) 30 tablet 0 Not Taking at Unknown time    Review of Systems  Constitutional: Negative for fever, chills and malaise/fatigue.  Gastrointestinal: Positive for nausea and vomiting. Negative for abdominal pain, diarrhea and constipation.  Genitourinary: Negative for dysuria, urgency and frequency.  Neg - vaginal bleeding, discharge  Neurological: Negative for weakness.   Physical Exam   Blood pressure 124/80, pulse 100, resp. rate 18, height 5' 2.25" (1.581 m), weight 87.544 kg (193 lb).  Physical Exam  Constitutional: She is oriented to person, place, and time. She appears well-developed and well-nourished. No distress.  HENT:  Head: Normocephalic.  Cardiovascular: Normal rate.   Respiratory: Effort normal.  GI: Soft. Bowel sounds are normal. She exhibits no distension and no mass. There is tenderness (mild tenderness to palpation of the epigastric region). There is no rebound and no guarding.  Neurological: She is alert and oriented to person, place, and time.  Skin: Skin is warm and dry. No erythema.  Psychiatric: She has a normal mood and affect.   Results for orders placed or performed during the hospital encounter of 08/24/14 (from the past 24 hour(s))  Urinalysis, Routine  w reflex microscopic     Status: Abnormal   Collection Time: 08/24/14  6:35 PM  Result Value Ref Range   Color, Urine YELLOW YELLOW   APPearance CLEAR CLEAR   Specific Gravity, Urine >1.030 (H) 1.005 - 1.030   pH 6.0 5.0 - 8.0   Glucose, UA NEGATIVE NEGATIVE mg/dL   Hgb urine dipstick NEGATIVE NEGATIVE   Bilirubin Urine NEGATIVE NEGATIVE   Ketones, ur 15 (A) NEGATIVE mg/dL   Protein, ur NEGATIVE NEGATIVE mg/dL   Urobilinogen, UA 0.2 0.0 - 1.0 mg/dL   Nitrite NEGATIVE NEGATIVE   Leukocytes, UA NEGATIVE NEGATIVE  Pregnancy, urine POC     Status: None   Collection Time: 08/24/14  7:30 PM  Result Value Ref Range   Preg Test, Ur NEGATIVE NEGATIVE  CBC with Differential/Platelet     Status: Abnormal   Collection Time: 08/24/14  7:42 PM  Result Value Ref Range   WBC 6.9 4.0 - 10.5 K/uL   RBC 4.60 3.87 - 5.11 MIL/uL   Hemoglobin 13.0 12.0 - 15.0 g/dL   HCT 16.1 09.6 - 04.5 %   MCV 84.6 78.0 - 100.0 fL   MCH 28.3 26.0 - 34.0 pg   MCHC 33.4 30.0 - 36.0 g/dL   RDW 40.9 81.1 - 91.4 %   Platelets 288 150 - 400 K/uL   Neutrophils Relative % 82 (H) 43 - 77 %   Neutro Abs 5.6 1.7 - 7.7 K/uL   Lymphocytes Relative 14 12 - 46 %   Lymphs Abs 1.0 0.7 - 4.0 K/uL   Monocytes Relative 4 3 - 12 %   Monocytes Absolute 0.3 0.1 - 1.0 K/uL   Eosinophils Relative 0 0 - 5 %   Eosinophils Absolute 0.0 0.0 - 0.7 K/uL   Basophils Relative 0 0 - 1 %   Basophils Absolute 0.0 0.0 - 0.1 K/uL  Comprehensive metabolic panel     Status: Abnormal   Collection Time: 08/24/14  7:42 PM  Result Value Ref Range   Sodium 137 135 - 145 mmol/L   Potassium 3.7 3.5 - 5.1 mmol/L   Chloride 105 96 - 112 mmol/L   CO2 25 19 - 32 mmol/L   Glucose, Bld 81 70 - 99 mg/dL   BUN 14 6 - 23 mg/dL   Creatinine, Ser 7.82 0.50 - 1.10 mg/dL   Calcium 8.9 8.4 - 95.6 mg/dL   Total Protein 7.3 6.0 - 8.3 g/dL   Albumin 4.5 3.5 - 5.2 g/dL   AST 28 0 - 37 U/L   ALT 34 0 - 35 U/L   Alkaline  Phosphatase 68 39 - 117 U/L   Total  Bilirubin 1.5 (H) 0.3 - 1.2 mg/dL   GFR calc non Af Amer >90 >90 mL/min   GFR calc Af Amer >90 >90 mL/min   Anion gap 7 5 - 15  Amylase     Status: Abnormal   Collection Time: 08/24/14  7:42 PM  Result Value Ref Range   Amylase 111 (H) 0 - 105 U/L  Lipase, blood     Status: None   Collection Time: 08/24/14  7:42 PM  Result Value Ref Range   Lipase 21 11 - 59 U/L    MAU Course  Procedures None  MDM UPT - negative UA, CBC, CMP, Amylase and lipase today UA shows signs of moderate dehydration. 25 mg Phenergan infusion in 1 liter LR ordered.  Patient receiving IV fluids. Labs pending. Care turned over to Thressa ShellerHeather Jossue Rubenstein, CNM at [phone]: Patient is resting comfortably after IV meds. She has not vomited since having them, and has tolerated PO   Marny LowensteinJulie N Wenzel, PA-C  08/24/2014, 8:28 PM   Assessment and Plan   1. Nausea and vomiting, vomiting of unspecified type    DC home RX phenergan Return precautions reviewed  Follow-up Information    Follow up with [facility].   Why:  If symptoms worsen   Contact information:   [address] [place]  [phone]

## 2015-05-01 ENCOUNTER — Ambulatory Visit (INDEPENDENT_AMBULATORY_CARE_PROVIDER_SITE_OTHER): Payer: BLUE CROSS/BLUE SHIELD | Admitting: Family Medicine

## 2015-05-01 ENCOUNTER — Encounter: Payer: Self-pay | Admitting: Family Medicine

## 2015-05-01 VITALS — BP 110/64 | HR 72 | Temp 98.0°F | Resp 18 | Ht 62.0 in | Wt 195.0 lb

## 2015-05-01 DIAGNOSIS — Z0000 Encounter for general adult medical examination without abnormal findings: Secondary | ICD-10-CM

## 2015-05-01 DIAGNOSIS — E669 Obesity, unspecified: Secondary | ICD-10-CM | POA: Diagnosis not present

## 2015-05-01 DIAGNOSIS — R87810 Cervical high risk human papillomavirus (HPV) DNA test positive: Secondary | ICD-10-CM

## 2015-05-01 DIAGNOSIS — Z113 Encounter for screening for infections with a predominantly sexual mode of transmission: Secondary | ICD-10-CM | POA: Diagnosis not present

## 2015-05-01 DIAGNOSIS — R87610 Atypical squamous cells of undetermined significance on cytologic smear of cervix (ASC-US): Secondary | ICD-10-CM | POA: Diagnosis not present

## 2015-05-01 LAB — WET PREP FOR TRICH, YEAST, CLUE
Clue Cells Wet Prep HPF POC: NONE SEEN
TRICH WET PREP: NONE SEEN

## 2015-05-01 NOTE — Progress Notes (Signed)
Patient ID: Candace HalesKimberly A Spoerl, female   DOB: Jun 04, 1985, 29 y.o.   MRN: [id number]   Subjective:    Patient ID: Candace [name], female    DOB: Jun 04, 1985, 29 y.o.   MRN: [id number]  Patient presents for CPE with PAP  patient here for complete physical. She is last seen 2 years ago. At that time she had a positive pregnancy test. She was sent to GYN but she had a missed abortion. Since then she had abnormal Pap smear with ASCUS positive HPV. She is due for repeat Pap smear today. Request STD screen, gets Wellness exam at work, No new concerns today Tetanus up-to-date Review Of Systems:  GEN- denies fatigue, fever, weight loss,weakness, recent illness HEENT- denies eye drainage, change in vision, nasal discharge, CVS- denies chest pain, palpitations RESP- denies SOB, cough, wheeze ABD- denies N/V, change in stools, abd pain GU- denies dysuria, hematuria, dribbling, incontinence MSK- denies joint pain, muscle aches, injury Neuro- denies headache, dizziness, syncope, seizure activity       Objective:    BP 110/64 mmHg  Pulse 72  Temp(Src) 98 F (36.7 C) (Oral)  Resp 18  Ht 5\' 2"  (1.575 m)  Wt 195 lb (88.451 kg)  BMI 35.66 kg/m2 GEN- NAD, alert and oriented x3 HEENT- PERRL, EOMI, non injected sclera, pink conjunctiva, MMM, oropharynx clear Neck- Supple, no thyromegaly Breast- normal symmetry, no nipple inversion,no nipple drainage, no nodules or lumps felt Nodes- no axillary nodes CVS- RRR, no murmur RESP-CTAB ABD-NABS,soft,NT,ND GU- normal external genitalia, vaginal mucosa pink and moist, cervix visualized no growth, no blood form os, minimal thin clear discharge, no CMT, no ovarian masses, uterus normal size, IUD strings visible  EXT- No edema Pulses- Radial, DP- 2+        Assessment & Plan:      Problem List Items Addressed This Visit    None    Visit Diagnoses    Routine general medical examination at a health care facility    -  Primary    Non fasting labs,  will get Lipids at work, declines flu shot, PAP Smear done, history of ASCUS, cotesting also done, STD screen done, discussed healthy eating and weight loss    Relevant Orders    CBC with Differential/Platelet    Comprehensive metabolic panel    TSH    Screen for STD (sexually transmitted disease)        Relevant Orders    GC/Chlamydia Probe Amp    WET PREP FOR TRICH, YEAST, CLUE    HIV antibody    RPR    ASCUS with positive high risk HPV cervical        Relevant Orders    Pap IG and HPV (high risk) DNA detection       Note: This dictation was prepared with Dragon dictation along with smaller phrase technology. Any transcriptional errors that result from this process are unintentional.

## 2015-05-01 NOTE — Patient Instructions (Signed)
We will call with  Lab results Get cholesterol checked at work F/U 1 year or as needed

## 2015-05-02 LAB — CBC WITH DIFFERENTIAL/PLATELET
Basophils Absolute: 0 10*3/uL (ref 0.0–0.1)
Basophils Relative: 0 % (ref 0–1)
EOS ABS: 0.3 10*3/uL (ref 0.0–0.7)
EOS PCT: 4 % (ref 0–5)
HEMATOCRIT: 39.1 % (ref 36.0–46.0)
Hemoglobin: 12.8 g/dL (ref 12.0–15.0)
LYMPHS ABS: 2.6 10*3/uL (ref 0.7–4.0)
LYMPHS PCT: 36 % (ref 12–46)
MCH: 28.3 pg (ref 26.0–34.0)
MCHC: 32.7 g/dL (ref 30.0–36.0)
MCV: 86.5 fL (ref 78.0–100.0)
MONOS PCT: 8 % (ref 3–12)
MPV: 10.1 fL (ref 8.6–12.4)
Monocytes Absolute: 0.6 10*3/uL (ref 0.1–1.0)
Neutro Abs: 3.8 10*3/uL (ref 1.7–7.7)
Neutrophils Relative %: 52 % (ref 43–77)
PLATELETS: 268 10*3/uL (ref 150–400)
RBC: 4.52 MIL/uL (ref 3.87–5.11)
RDW: 13.7 % (ref 11.5–15.5)
WBC: 7.3 10*3/uL (ref 4.0–10.5)

## 2015-05-02 LAB — TSH: TSH: 1.4 u[IU]/mL (ref 0.350–4.500)

## 2015-05-02 LAB — COMPREHENSIVE METABOLIC PANEL
ALK PHOS: 57 U/L (ref 33–115)
ALT: 27 U/L (ref 6–29)
AST: 20 U/L (ref 10–30)
Albumin: 4.1 g/dL (ref 3.6–5.1)
BUN: 13 mg/dL (ref 7–25)
CALCIUM: 8.7 mg/dL (ref 8.6–10.2)
CHLORIDE: 104 mmol/L (ref 98–110)
CO2: 21 mmol/L (ref 20–31)
Creat: 0.82 mg/dL (ref 0.50–1.10)
Glucose, Bld: 69 mg/dL — ABNORMAL LOW (ref 70–99)
POTASSIUM: 4 mmol/L (ref 3.5–5.3)
Sodium: 140 mmol/L (ref 135–146)
TOTAL PROTEIN: 6.5 g/dL (ref 6.1–8.1)
Total Bilirubin: 0.5 mg/dL (ref 0.2–1.2)

## 2015-05-02 LAB — RPR

## 2015-05-02 LAB — GC/CHLAMYDIA PROBE AMP
CT PROBE, AMP APTIMA: NOT DETECTED
GC Probe RNA: NOT DETECTED

## 2015-05-02 LAB — HIV ANTIBODY (ROUTINE TESTING W REFLEX): HIV 1&2 Ab, 4th Generation: NONREACTIVE

## 2015-05-03 LAB — PAP IG AND HPV HIGH-RISK: HPV DNA High Risk: DETECTED — AB

## 2015-05-04 ENCOUNTER — Other Ambulatory Visit: Payer: Self-pay | Admitting: *Deleted

## 2015-10-12 ENCOUNTER — Ambulatory Visit (INDEPENDENT_AMBULATORY_CARE_PROVIDER_SITE_OTHER): Payer: BLUE CROSS/BLUE SHIELD | Admitting: Family Medicine

## 2015-10-12 ENCOUNTER — Telehealth: Payer: Self-pay | Admitting: Family Medicine

## 2015-10-12 ENCOUNTER — Encounter: Payer: Self-pay | Admitting: Family Medicine

## 2015-10-12 VITALS — BP 128/64 | HR 82 | Temp 98.2°F | Resp 14 | Ht 62.0 in | Wt 199.0 lb

## 2015-10-12 DIAGNOSIS — R112 Nausea with vomiting, unspecified: Secondary | ICD-10-CM | POA: Diagnosis not present

## 2015-10-12 DIAGNOSIS — N926 Irregular menstruation, unspecified: Secondary | ICD-10-CM | POA: Diagnosis not present

## 2015-10-12 DIAGNOSIS — IMO0001 Reserved for inherently not codable concepts without codable children: Secondary | ICD-10-CM

## 2015-10-12 DIAGNOSIS — K219 Gastro-esophageal reflux disease without esophagitis: Secondary | ICD-10-CM

## 2015-10-12 LAB — COMPREHENSIVE METABOLIC PANEL
ALBUMIN: 4.2 g/dL (ref 3.6–5.1)
ALT: 15 U/L (ref 6–29)
AST: 15 U/L (ref 10–30)
Alkaline Phosphatase: 63 U/L (ref 33–115)
BUN: 15 mg/dL (ref 7–25)
CALCIUM: 9.4 mg/dL (ref 8.6–10.2)
CHLORIDE: 106 mmol/L (ref 98–110)
CO2: 28 mmol/L (ref 20–31)
Creat: 0.94 mg/dL (ref 0.50–1.10)
GLUCOSE: 110 mg/dL — AB (ref 70–99)
Potassium: 4.8 mmol/L (ref 3.5–5.3)
SODIUM: 144 mmol/L (ref 135–146)
Total Bilirubin: 0.6 mg/dL (ref 0.2–1.2)
Total Protein: 7 g/dL (ref 6.1–8.1)

## 2015-10-12 LAB — CBC WITH DIFFERENTIAL/PLATELET
BASOS ABS: 0 {cells}/uL (ref 0–200)
Basophils Relative: 0 %
EOS PCT: 2 %
Eosinophils Absolute: 102 cells/uL (ref 15–500)
HEMATOCRIT: 40 % (ref 35.0–45.0)
HEMOGLOBIN: 12.9 g/dL (ref 12.0–15.0)
LYMPHS ABS: 1530 {cells}/uL (ref 850–3900)
LYMPHS PCT: 30 %
MCH: 28.3 pg (ref 27.0–33.0)
MCHC: 32.3 g/dL (ref 32.0–36.0)
MCV: 87.7 fL (ref 80.0–100.0)
MPV: 10.6 fL (ref 7.5–12.5)
Monocytes Absolute: 459 cells/uL (ref 200–950)
Monocytes Relative: 9 %
NEUTROS PCT: 59 %
Neutro Abs: 3009 cells/uL (ref 1500–7800)
Platelets: 279 10*3/uL (ref 140–400)
RBC: 4.56 MIL/uL (ref 3.80–5.10)
RDW: 13.8 % (ref 11.0–15.0)
WBC: 5.1 10*3/uL (ref 3.8–10.8)

## 2015-10-12 LAB — PREGNANCY, URINE: PREG TEST UR: NEGATIVE

## 2015-10-12 LAB — LIPASE: Lipase: 21 U/L (ref 7–60)

## 2015-10-12 MED ORDER — ESOMEPRAZOLE MAGNESIUM 20 MG PO CPDR: 20.0000 mg | DELAYED_RELEASE_CAPSULE | Freq: Every day | ORAL | Status: DC

## 2015-10-12 NOTE — Progress Notes (Signed)
Patient ID: Candace [name], female   DOB: 1985/07/21, 30 y.o.   MRN: [id number]    Subjective:    Patient ID: Candace [name], female    DOB: 1985/07/21, 30 y.o.   MRN: [id number]  Patient presents for Reflux and Irregular Menses  Pt here with nausea/vomiting after eating for past few months. NB/NBThere are no particular foods that seem to cause the nausea she states it is everything. She states it is also everyday. She does not always vomit but does get nauseous. She does not have any abdominal pain. Has not taken any over-the-counter anti-inflammatories. She's not tried any over-the-counter to help with her nausea. No change in stools    Has Mirena IUD , but has had spotting, wants Upreg , she noticed changes in her monthly cycle around the same time that she had the nausea. She typically has a cycle that lasts about 7 days with the past few months it is only been a few days or either just spotting. No vaginal discharge     Review Of Systems:  GEN- denies fatigue, fever, weight loss,weakness, recent illness HEENT- denies eye drainage, change in vision, nasal discharge, CVS- denies chest pain, palpitations RESP- denies SOB, cough, wheeze ABD- + N/V, change in stools, abd pain GU- denies dysuria, hematuria, dribbling, incontinence MSK- denies joint pain, muscle aches, injury Neuro- denies headache, dizziness, syncope, seizure activity       Objective:    BP 128/64 mmHg  Pulse 82  Temp(Src) 98.2 F (36.8 C) (Oral)  Resp 14  Ht 5\' 2"  (1.575 m)  Wt 199 lb (90.266 kg)  BMI 36.39 kg/m2 GEN- NAD, alert and oriented x3 HEENT- PERRL, EOMI, non injected sclera, pink conjunctiva, MMM, oropharynx clear CVS- RRR, no murmur RESP-CTAB ABD-NABS,soft,NT,ND EXT- No edema Pulses- Radial, DP- 2+        Assessment & Plan:      Problem List Items Addressed This Visit    None    Visit Diagnoses    Irregular menses    -  Primary    2/2 Mirena, Upreg negative, no pain to indicate  tubal otherwise     Relevant Orders    Pregnancy, urine (Completed)    Reflux        Concern for reflux, other possibility gallbladder etiology. No risk factors for gastritis otherwise, trial of nexium, check labs, no improvement RUQ ultrasound     Relevant Orders    CBC with Differential/Platelet    Comprehensive metabolic panel    Lipase    Non-intractable vomiting with nausea, unspecified vomiting type        Relevant Orders    CBC with Differential/Platelet    Comprehensive metabolic panel    Lipase       Note: This dictation was prepared with Dragon dictation along with smaller phrase technology. Any transcriptional errors that result from this process are unintentional.

## 2015-10-12 NOTE — Patient Instructions (Signed)
Take nexium once a day for next 2 weeks Avoid fried foods, fatty foods, spicey foods Sit up right after eating for at least 30 minutes  Call if not improved and ultrasound will be done to look for gallstones F/U as needed

## 2015-10-16 ENCOUNTER — Telehealth: Payer: Self-pay

## 2015-10-16 NOTE — Telephone Encounter (Signed)
-----   Message from Candace ScarletKawanta F [place], MD sent at 10/14/2015  8:05 PM EDT ----- Labs are normal,take the nexium for the 2 weeks, if not better, she is to call back and US will be done

## 2015-10-16 NOTE — Telephone Encounter (Signed)
Left message for patient to call office regarding lab results and recommendations. 

## 2015-10-26 ENCOUNTER — Telehealth: Payer: Self-pay | Admitting: Family Medicine

## 2015-10-26 DIAGNOSIS — R112 Nausea with vomiting, unspecified: Secondary | ICD-10-CM

## 2015-10-26 DIAGNOSIS — K219 Gastro-esophageal reflux disease without esophagitis: Secondary | ICD-10-CM

## 2015-10-26 MED ORDER — SUCRALFATE 1 GM/10ML PO SUSP: 1.0000 g | Freq: Three times a day (TID) | ORAL | Status: DC

## 2015-10-26 NOTE — Telephone Encounter (Signed)
Pt aware of provider recommendations.  Rx to pharmacy and ultrasound ordered.  [place], can [place] ultrasound there.

## 2015-10-26 NOTE — Telephone Encounter (Signed)
Schedule RUQ ultrasound, evaluate for gallstones Send in carafate liquid

## 2015-10-26 NOTE — Telephone Encounter (Signed)
Patient states she has taken the Nexium however she is still having some vomiting and nausea. Please advise.  CB# [phone]

## 2015-10-30 ENCOUNTER — Ambulatory Visit (HOSPITAL_BASED_OUTPATIENT_CLINIC_OR_DEPARTMENT_OTHER)
Admission: RE | Admit: 2015-10-30 | Discharge: 2015-10-30 | Disposition: A | Discharge status: Home / Self Care | Payer: BLUE CROSS/BLUE SHIELD | Source: Ambulatory Visit | Attending: Family Medicine | Admitting: Family Medicine

## 2015-10-30 DIAGNOSIS — K219 Gastro-esophageal reflux disease without esophagitis: Secondary | ICD-10-CM | POA: Insufficient documentation

## 2015-10-30 DIAGNOSIS — R112 Nausea with vomiting, unspecified: Secondary | ICD-10-CM | POA: Diagnosis present

## 2015-10-31 ENCOUNTER — Other Ambulatory Visit: Payer: Self-pay | Admitting: Family Medicine

## 2015-10-31 ENCOUNTER — Encounter: Payer: Self-pay | Admitting: Gastroenterology

## 2015-10-31 DIAGNOSIS — R112 Nausea with vomiting, unspecified: Secondary | ICD-10-CM

## 2015-10-31 DIAGNOSIS — K219 Gastro-esophageal reflux disease without esophagitis: Secondary | ICD-10-CM

## 2015-11-06 ENCOUNTER — Telehealth: Payer: Self-pay | Admitting: Family Medicine

## 2015-11-06 MED ORDER — ESOMEPRAZOLE MAGNESIUM 20 MG PO CPDR: 20.0000 mg | DELAYED_RELEASE_CAPSULE | Freq: Every day | ORAL | Status: DC

## 2015-11-06 NOTE — Telephone Encounter (Signed)
[facility] [place]  Patient calling to ask if she can switch her acid reflux med to nexium if possible  [phone] (H)

## 2015-11-06 NOTE — Addendum Note (Signed)
Addended by: Phillips OdorSIX, Maela Takeda H on: 11/06/2015 04:51 PM   Modules accepted: Orders

## 2015-11-06 NOTE — Telephone Encounter (Signed)
Prescription sent to pharmacy.

## 2015-11-06 NOTE — Telephone Encounter (Signed)
Ok to change

## 2015-11-06 NOTE — Telephone Encounter (Signed)
Okay to send nexium

## 2015-12-18 ENCOUNTER — Encounter (INDEPENDENT_AMBULATORY_CARE_PROVIDER_SITE_OTHER): Payer: Self-pay

## 2015-12-18 ENCOUNTER — Encounter: Payer: Self-pay | Admitting: Gastroenterology

## 2015-12-18 ENCOUNTER — Ambulatory Visit (INDEPENDENT_AMBULATORY_CARE_PROVIDER_SITE_OTHER): Payer: BLUE CROSS/BLUE SHIELD | Admitting: Gastroenterology

## 2015-12-18 VITALS — BP 94/70 | HR 88 | Ht 62.25 in | Wt 200.0 lb

## 2015-12-18 DIAGNOSIS — R932 Abnormal findings on diagnostic imaging of liver and biliary tract: Secondary | ICD-10-CM

## 2015-12-18 DIAGNOSIS — G43A0 Cyclical vomiting, not intractable: Secondary | ICD-10-CM | POA: Diagnosis not present

## 2015-12-18 DIAGNOSIS — R1011 Right upper quadrant pain: Secondary | ICD-10-CM

## 2015-12-18 DIAGNOSIS — R1115 Cyclical vomiting syndrome unrelated to migraine: Secondary | ICD-10-CM

## 2015-12-18 NOTE — Progress Notes (Signed)
[facility] Consult Note:  History: Candace [name] 12/18/2015  Referring physician: Milinda AntisURHAM, KAWANTA, MD  Reason for consult/chief complaint: nausea and vomiting (Nexium not helping but Carafate helped some); Abdominal Pain (RUQ); and abnormal US (liver)   Subjective  HPI:  About 6 months of post-prandial vomiting and frequent nausea.  Vomits undigested food within 30 minutes.  No weight loss.  Also frequent right-sided abd pain, non radiating and no clear triggers or relieving factors.  Not temporally  related to the vomiting episodes. BM's regular without bleeding. Had US showing something on liver, so she is worried.  ROS:  Review of Systems  Constitutional: Negative for appetite change and unexpected weight change.  HENT: Negative for mouth sores and voice change.   Eyes: Negative for pain and redness.  Respiratory: Negative for cough and shortness of breath.   Cardiovascular: Negative for chest pain and palpitations.  Genitourinary: Negative for dysuria and hematuria.  Musculoskeletal: Negative for arthralgias and myalgias.  Skin: Negative for pallor and rash.  Neurological: Negative for weakness and headaches.  Hematological: Negative for adenopathy.     Past Medical History: Past Medical History:  Diagnosis Date  . Allergy   . Anemia   . BV (bacterial vaginosis) 12/15/2012  . Diabetes (HCC)   . Eczema   . Headache(784.0)   . History of chlamydia   . History of gonorrhea   . Irregular heartbeat   . Vaginal itching 12/15/2012  . Vaginal Pap smear, abnormal    f/u ok     Past Surgical History: Past Surgical History:  Procedure Laterality Date  . DILATION AND CURETTAGE OF UTERUS N/A 11/22/2012   Procedure: SUCTION DILATATION AND CURETTAGE;  Surgeon: Lazaro ArmsLuther H Eure, MD;  Location: [facility];  Service: Gynecology;  Laterality: N/A;     Family History: Family History  Problem Relation Age of Onset  . Prostate cancer Paternal Grandfather   . Kidney  disease Paternal Grandmother   . Breast cancer Paternal Grandmother   . Lung cancer Paternal Grandmother   . Lung cancer Maternal Grandfather   . Colon polyps Father   . Prostate cancer Father   . Breast cancer Maternal Aunt     Multiple with Breast Cancer  . Cervical cancer Maternal Aunt   . Stroke Maternal Aunt   . Hypertension Other   . Diabetes Other   . Other Other     stomach tumors  . Heart disease Other     her mom, gma and aunts have arrythmia  . Kidney disease Paternal Aunt   . Other Paternal Aunt     colon problems  . Stroke Paternal Uncle     PGreatUncle  . Prostate cancer Paternal Uncle     Social History: Social History   Social History  . Marital status: Single    Spouse name: N/A  . Number of children: 2  . Years of education: N/A   Occupational History  . CNA    Social History Main Topics  . Smoking status: Never Smoker  . Smokeless tobacco: Never Used  . Alcohol use Yes     Comment: socially  . Drug use: No  . Sexual activity: Yes    Birth control/ protection: IUD   Other Topics Concern  . None   Social History Narrative  . None    Allergies: Allergies  Allergen Reactions  . Hydrocodone     violent  . Vicodin [Hydrocodone-Acetaminophen] Other (See Comments)    violent  . Latex Itching  and Rash    Outpatient Meds: Current Outpatient Prescriptions  Medication Sig Dispense Refill  . esomeprazole (NEXIUM) 20 MG capsule Take 1 capsule (20 mg total) by mouth daily. 30 capsule 3  . levonorgestrel (MIRENA) 20 MCG/24HR IUD 1 each by Intrauterine route once.     No current facility-administered medications for this visit.       ___________________________________________________________________ Objective   Exam:  BP 94/70 (BP Location: Left Arm, Patient Position: Sitting, Cuff Size: Normal)   Pulse 88   Ht 5' 2.25" (1.581 m) Comment: height measured without shoes  Wt 200 lb (90.7 kg)   LMP 11/12/2015   BMI 36.29 kg/m  Mother  present for entire visit  General: this is a(n) overweight young woman, well-appearing and in good spirits   Eyes: sclera anicteric, no redness  ENT: oral mucosa moist without lesions, no cervical or supraclavicular lymphadenopathy, good dentition  CV: RRR without murmur, S1/S2, no JVD, no peripheral edema  Resp: clear to auscultation bilaterally, normal RR and effort noted  GI: soft, no tenderness, with active bowel sounds. No guarding or palpable organomegaly noted.  Skin; warm and dry, no rash or jaundice noted  Neuro: awake, alert and oriented x 3. Normal gross motor function and fluent speech  Labs:  CMP Latest Ref Rng & Units 10/12/2015 05/01/2015 08/24/2014  Glucose 70 - 99 mg/dL 161(W) 96(E) 81  BUN 7 - 25 mg/dL Creatinine 0.50 - 1.10 mg/dL 4.54 0.98 1.19  Sodium 135 - 146 mmol/L 144 140 137  Potassium 3.5 - 5.3 mmol/L 4.8 4.0 3.7  Chloride 98 - 110 mmol/L 106 104 105  CO2 20 - 31 mmol/L Calcium 8.6 - 10.2 mg/dL 9.4 8.7 8.9  Total Protein 6.1 - 8.1 g/dL 7.0 6.5 7.3  Total Bilirubin 0.2 - 1.2 mg/dL 0.6 0.5 1.4(N)  Alkaline Phos 33 - 115 U/L 63 57 68  AST 10 - 30 U/L ALT 6 - 29 U/L 15 27 34   CBC    Component Value Date/Time   WBC 5.1 10/12/2015 1009   RBC 4.56 10/12/2015 1009   HGB 12.9 10/12/2015 1009   HCT 40.0 10/12/2015 1009   PLT 279 10/12/2015 1009   MCV 87.7 10/12/2015 1009   MCH 28.3 10/12/2015 1009   MCHC 32.3 10/12/2015 1009   RDW 13.8 10/12/2015 1009   LYMPHSABS 1,530 10/12/2015 1009   MONOABS 459 10/12/2015 1009   EOSABS 102 10/12/2015 1009   BASOSABS 0 10/12/2015 1009     Radiologic Studies:  [place] - CBD nml, no gallstones,57mm hepatic lesion right lobe   Assessment: Encounter Diagnoses  Name Primary?  . Non-intractable cyclical vomiting with nausea Yes  . RUQ pain   . Abnormal findings on diagnostic imaging of liver    Liver lesion appears clincally insignificant and too small to characterize. ? GOO, PUD.   Seems more than expected for GERD.  No dysphagia to suggest achalasia or esophageal stricture.  Plan:  EGD  Thank you for the courtesy of this consult.  Please call me with any questions or concerns.  Charlie Pitter III  CC: Milinda Antis, MD

## 2015-12-18 NOTE — Patient Instructions (Signed)
You have been scheduled for an endoscopy. Please follow written instructions given to you at your visit today. If you use inhalers (even only as needed), please bring them with you on the day of your procedure. Your physician has requested that you go to www.startemmi.com and enter the access code given to you at your visit today. This web site gives a general overview about your procedure. However, you should still follow specific instructions given to you by our office regarding your preparation for the procedure.  If you are age 31 or older, your body mass index should be between 23-30. Your Body mass index is 36.29 kg/m. If this is out of the aforementioned range listed, please consider follow up with your Primary Care Provider.  If you are age 31 or younger, your body mass index should be between 19-25. Your Body mass index is 36.29 kg/m. If this is out of the aformentioned range listed, please consider follow up with your Primary Care Provider.   Thank you for choosing [facility]  Dr Amada JupiterHenry Danis III

## 2015-12-25 ENCOUNTER — Encounter: Payer: Self-pay | Admitting: Gastroenterology

## 2016-01-04 ENCOUNTER — Other Ambulatory Visit: Payer: Self-pay

## 2016-01-04 ENCOUNTER — Telehealth: Payer: Self-pay

## 2016-01-04 ENCOUNTER — Encounter: Payer: Self-pay | Admitting: Gastroenterology

## 2016-01-04 ENCOUNTER — Ambulatory Visit (AMBULATORY_SURGERY_CENTER): Payer: BLUE CROSS/BLUE SHIELD | Admitting: Gastroenterology

## 2016-01-04 ENCOUNTER — Encounter: Payer: BLUE CROSS/BLUE SHIELD | Admitting: Gastroenterology

## 2016-01-04 VITALS — BP 131/62 | HR 65 | Temp 98.0°F | Resp 11 | Ht 62.25 in | Wt 200.0 lb

## 2016-01-04 DIAGNOSIS — G43A0 Cyclical vomiting, not intractable: Secondary | ICD-10-CM

## 2016-01-04 DIAGNOSIS — R1115 Cyclical vomiting syndrome unrelated to migraine: Secondary | ICD-10-CM

## 2016-01-04 DIAGNOSIS — R1011 Right upper quadrant pain: Secondary | ICD-10-CM

## 2016-01-04 MED ORDER — SODIUM CHLORIDE 0.9 % IV SOLN: 500.0000 mL | INTRAVENOUS | Status: DC

## 2016-01-04 NOTE — Patient Instructions (Signed)
YOU HAD AN ENDOSCOPIC PROCEDURE TODAY AT THE [facility]:   Refer to the procedure report that was given to you for any specific questions about what was found during the examination.  If the procedure report does not answer your questions, please call your gastroenterologist to clarify.  If you requested that your care partner not be given the details of your procedure findings, then the procedure report has been included in a sealed envelope for you to review at your convenience later.  YOU SHOULD EXPECT: Some feelings of bloating in the abdomen. Passage of more gas than usual.  Walking can help get rid of the air that was put into your GI tract during the procedure and reduce the bloating. If you had a lower endoscopy (such as a colonoscopy or flexible sigmoidoscopy) you may notice spotting of blood in your stool or on the toilet paper. If you underwent a bowel prep for your procedure, you may not have a normal bowel movement for a few days.  Please Note:  You might notice some irritation and congestion in your nose or some drainage.  This is from the oxygen used during your procedure.  There is no need for concern and it should clear up in a day or so.  SYMPTOMS TO REPORT IMMEDIATELY:   Following upper endoscopy (EGD)  Vomiting of blood or coffee ground material  New chest pain or pain under the shoulder blades  Painful or persistently difficult swallowing  New shortness of breath  Fever of 100F or higher  Black, tarry-looking stools  For urgent or emergent issues, a gastroenterologist can be reached at any hour by calling [phone].   DIET:  We do recommend a small meal at first, but then you may proceed to your regular diet.  Drink plenty of fluids but you should avoid alcoholic beverages for 24 hours.  ACTIVITY:  You should plan to take it easy for the rest of today and you should NOT DRIVE or use heavy machinery until tomorrow (because of the sedation medicines used  during the test).    FOLLOW UP: Our staff will call the number listed on your records the next business day following your procedure to check on you and address any questions or concerns that you may have regarding the information given to you following your procedure. If we do not reach you, we will leave a message.  However, if you are feeling well and you are not experiencing any problems, there is no need to return our call.  We will assume that you have returned to your regular daily activities without incident.  If any biopsies were taken you will be contacted by phone or by letter within the next 1-3 weeks.  Please call us at [phone] if you have not heard about the biopsies in 3 weeks.    SIGNATURES/CONFIDENTIALITY: You and/or your care partner have signed paperwork which will be entered into your electronic medical record.  These signatures attest to the fact that that the information above on your After Visit Summary has been reviewed and is understood.  Full responsibility of the confidentiality of this discharge information lies with you and/or your care-partner.  Office will schedule 4 hour gastric emptying study.

## 2016-01-04 NOTE — Telephone Encounter (Signed)
Gastric emptying scan has been scheduled at [facility] on 01-09-2017 @ 730am. Pt was given verbal directions and states understanding.   Mailed a written copy as well.

## 2016-01-04 NOTE — Op Note (Signed)
[facility] Patient Name: Candace [name] Procedure Date: 01/04/2016 1:23 PM MRN: [id number] Endoscopist: Sherilyn Cooter L. Myrtie Neither , MD Age: 30 Referring MD:  Date of Birth: 26-Mar-1986 Gender: Female Account #: [id number] Procedure:                Upper GI endoscopy Indications:              Persistent vomiting Medicines:                Monitored Anesthesia Care Procedure:                Pre-Anesthesia Assessment:                           - Prior to the procedure, a History and Physical                            was performed, and patient medications and                            allergies were reviewed. The patient's tolerance of                            previous anesthesia was also reviewed. The risks                            and benefits of the procedure and the sedation                            options and risks were discussed with the patient.                            All questions were answered, and informed consent                            was obtained. Prior Anticoagulants: The patient has                            taken no previous anticoagulant or antiplatelet                            agents. ASA Grade Assessment: II - A patient with                            mild systemic disease. After reviewing the risks                            and benefits, the patient was deemed in                            satisfactory condition to undergo the procedure.                           After obtaining informed consent, the endoscope was  passed under direct vision. Throughout the                            procedure, the patient's blood pressure, pulse, and                            oxygen saturations were monitored continuously. The                            Model GIF-[id number]) scope was introduced                            through the mouth, and advanced to the second part                            of duodenum. The upper GI  endoscopy was                            accomplished without difficulty. The patient                            tolerated the procedure well. Scope In: Scope Out: Findings:                 The esophagus was normal.                           The stomach was normal.                           The cardia and gastric fundus were normal on                            retroflexion.                           The examined duodenum was normal. Complications:            No immediate complications. Estimated Blood Loss:     Estimated blood loss: none. Impression:               - Normal esophagus.                           - Normal stomach.                           - Normal examined duodenum.                           - No specimens collected. Recommendation:           - Patient has a contact number available for                            emergencies. The signs and symptoms of potential  delayed complications were discussed with the                            patient. Return to normal activities tomorrow.                            Written discharge instructions were provided to the                            patient.                           - Resume previous diet.                           - Continue present medications.                           - Schedule 4-hour gastric emptying study. Henry L. Myrtie Neitheranis, MD 01/04/2016 1:38:00 PM This report has been signed electronically.

## 2016-01-04 NOTE — Progress Notes (Signed)
Report to PACU, RN, vss, BBS= Clear.  

## 2016-01-07 ENCOUNTER — Telehealth: Payer: Self-pay | Admitting: *Deleted

## 2016-01-07 NOTE — Telephone Encounter (Signed)
Left message on f/u call 

## 2016-01-10 ENCOUNTER — Ambulatory Visit (HOSPITAL_COMMUNITY)
Admission: RE | Admit: 2016-01-10 | Discharge: 2016-01-10 | Disposition: A | Discharge status: Home / Self Care | Payer: BLUE CROSS/BLUE SHIELD | Source: Ambulatory Visit | Attending: Gastroenterology | Admitting: Gastroenterology

## 2016-01-10 DIAGNOSIS — R1110 Vomiting, unspecified: Secondary | ICD-10-CM | POA: Insufficient documentation

## 2016-01-10 DIAGNOSIS — R1115 Cyclical vomiting syndrome unrelated to migraine: Secondary | ICD-10-CM

## 2016-01-10 DIAGNOSIS — R1011 Right upper quadrant pain: Secondary | ICD-10-CM | POA: Insufficient documentation

## 2016-01-10 MED ORDER — TECHNETIUM TC 99M SULFUR COLLOID
2.0000 | Freq: Once | INTRAVENOUS | Status: AC
  Administered 2016-01-10: 2 via INTRAVENOUS

## 2016-01-16 ENCOUNTER — Other Ambulatory Visit: Payer: Self-pay

## 2016-01-16 MED ORDER — ONDANSETRON 4 MG PO TBDP: 4.0000 mg | ORAL_TABLET | Freq: Two times a day (BID) | ORAL | 3 refills | Status: DC

## 2016-02-19 ENCOUNTER — Encounter: Payer: Self-pay | Admitting: Gastroenterology

## 2016-02-19 ENCOUNTER — Ambulatory Visit (INDEPENDENT_AMBULATORY_CARE_PROVIDER_SITE_OTHER): Payer: BLUE CROSS/BLUE SHIELD | Admitting: Gastroenterology

## 2016-02-19 VITALS — BP 98/74 | HR 68 | Ht 62.25 in | Wt 198.5 lb

## 2016-02-19 DIAGNOSIS — R110 Nausea: Secondary | ICD-10-CM

## 2016-02-19 NOTE — Patient Instructions (Signed)
If you are age 465 or older, your body mass index should be between 23-30. Your Body mass index is 36.02 kg/m. If this is out of the aforementioned range listed, please consider follow up with your Primary Care Provider.  If you are age 364 or younger, your body mass index should be between 19-25. Your Body mass index is 36.02 kg/m. If this is out of the aformentioned range listed, please consider follow up with your Primary Care Provider.   Thank you for choosing [facility]  Dr Amada JupiterHenry Danis III

## 2016-02-19 NOTE — Progress Notes (Signed)
Candace [name] GI Progress Note  Chief Complaint: Nausea  Subjective  History:  Complete follows up for her upper abdominal symptoms. Zofran 4 mg twice a day has controlled her symptoms such that she no longer has vomiting a right upper quadrant pain. Previous gallbladder testing was normal. Upper endoscopy and 4 hour gastric emptying study were normal She still has intermittent nausea controlled with Zofran. Candace [name] denies headache numbness or weakness in any limbs or visual disturbance.  ROS: Cardiovascular:  no chest pain Respiratory: no dyspnea  The patient's Past Medical, Family and Social History were reviewed and are on file in the EMR.  Objective:  Med list reviewed  Vital signs in last 24 hrs: Vitals:   02/19/16 1102  BP: 98/74  Pulse: 68    Physical Exam    HEENT: sclera anicteric, oral mucosa moist without lesions  Neck: supple, no thyromegaly, JVD or lymphadenopathy  Cardiac: RRR without murmurs, S1S2 heard, no peripheral edema  Pulm: clear to auscultation bilaterally, normal RR and effort noted  Abdomen: soft, obese, no tenderness, with active bowel sounds. No guarding or palpable hepatosplenomegaly.  Skin; warm and dry, no jaundice or rash   Radiologic studies:  4 hour gastric emptying study norma l@ASSESSMENTPLANBEGIN @ Assessment: Encounter Diagnosis  Name Primary?  . Nausea without vomiting Yes    Cause of her symptoms is unclear. Does not appear to be GERD, there is nothing obstructive, her gallbladder checks out and she has no gastritis. It could be a mild gastric dysrhythmia and/or some other functional cause.  Plan:  As needed Zofran See me as needed  Total time 15 minutes, over half spent in counseling and coordination of care.   Charlie PitterHenry L Danis III

## 2016-04-15 ENCOUNTER — Ambulatory Visit (INDEPENDENT_AMBULATORY_CARE_PROVIDER_SITE_OTHER): Payer: BLUE CROSS/BLUE SHIELD

## 2016-04-15 DIAGNOSIS — Z111 Encounter for screening for respiratory tuberculosis: Secondary | ICD-10-CM | POA: Diagnosis not present

## 2016-04-17 ENCOUNTER — Ambulatory Visit: Payer: BLUE CROSS/BLUE SHIELD | Admitting: *Deleted

## 2016-04-17 LAB — TB SKIN TEST
Induration: 0 mm
TB Skin Test: NEGATIVE

## 2016-04-17 NOTE — Progress Notes (Signed)
Patient ID: Candace [name], female   DOB: 03-29-1986, 30 y.o.   MRN: [id number]  Patient in office to have PPD read.   No redness or induration noted.   Read as negative.

## 2016-08-01 ENCOUNTER — Encounter: Payer: Self-pay | Admitting: Family Medicine

## 2016-08-01 ENCOUNTER — Ambulatory Visit (INDEPENDENT_AMBULATORY_CARE_PROVIDER_SITE_OTHER): Payer: BLUE CROSS/BLUE SHIELD | Admitting: Family Medicine

## 2016-08-01 VITALS — BP 100/72 | HR 88 | Temp 98.6°F | Resp 14 | Ht 62.25 in | Wt 198.0 lb

## 2016-08-01 DIAGNOSIS — N760 Acute vaginitis: Secondary | ICD-10-CM

## 2016-08-01 DIAGNOSIS — B372 Candidiasis of skin and nail: Secondary | ICD-10-CM

## 2016-08-01 DIAGNOSIS — B9689 Other specified bacterial agents as the cause of diseases classified elsewhere: Secondary | ICD-10-CM | POA: Diagnosis not present

## 2016-08-01 LAB — WET PREP FOR TRICH, YEAST, CLUE
Trich, Wet Prep: NONE SEEN
Yeast Wet Prep HPF POC: NONE SEEN

## 2016-08-01 MED ORDER — METRONIDAZOLE 500 MG PO TABS: 500.0000 mg | ORAL_TABLET | Freq: Two times a day (BID) | ORAL | 0 refills | Status: DC

## 2016-08-01 MED ORDER — CLOTRIMAZOLE-BETAMETHASONE 1-0.05 % EX CREA: 1.0000 "application " | TOPICAL_CREAM | Freq: Two times a day (BID) | CUTANEOUS | 1 refills | Status: DC

## 2016-08-01 MED ORDER — FLUCONAZOLE 150 MG PO TABS: ORAL_TABLET | ORAL | 1 refills | Status: DC

## 2016-08-01 NOTE — Patient Instructions (Signed)
We will call with results Start diflucan Use the lotrisone for itching twice a day  F/U pending results

## 2016-08-01 NOTE — Progress Notes (Signed)
   Subjective:    Patient ID: Candace [name], female    DOB: 21-Oct-1985, 31 y.o.   MRN: [id number]  Patient presents for Vaginal Irritation (itching and irritation) and Rash (x3 days- irritation to skin under breasts)  Itching all over for past week. No new soap, or detergent ,itching beneath breast and between legs, mild vaginal discharge. Has used powder, vaseline/lotion    No recent illness  Denies abnormal vaginal bleeding   Review Of Systems:  GEN- denies fatigue, fever, weight loss,weakness, recent illness HEENT- denies eye drainage, change in vision, nasal discharge, CVS- denies chest pain, palpitations RESP- denies SOB, cough, wheeze ABD- denies N/V, change in stools, abd pain GU- denies dysuria, hematuria, dribbling, incontinence MSK- denies joint pain, muscle aches, injury Neuro- denies headache, dizziness, syncope, seizure activity       Objective:    BP 100/72   Pulse 88   Temp 98.6 F (37 C) (Oral)   Resp 14   Ht 5' 2.25" (1.581 m)   Wt 198 lb (89.8 kg)   SpO2 99%   BMI 35.92 kg/m  GEN- NAD, alert and oriented x3 Breast- normal symmetry, no nipple inversion,no nipple drainage, no nodules or lumps felt Nodes- no axillary nodes Skin- mild maceration , hyerpigmented shiny skin, few plaque like lesions scattered beneath breast, similar apperance in groin extending to inner thigh, +excoriations ABD-NABS,soft,NT,ND GU- normal external genitalia, vaginal mucosa pink and moist, cervix visualized no growth, no blood form os,+discharge, no CMT, no ovarian masses, uterus normal size Pulses- Radial - 2+        Assessment & Plan:      Problem List Items Addressed This Visit    None    Visit Diagnoses    BV (bacterial vaginosis)    -  Primary   Treat with flagyl   Relevant Medications   clotrimazole-betamethasone (LOTRISONE) cream   fluconazole (DIFLUCAN) 150 MG tablet   metroNIDAZOLE (FLAGYL) 500 MG tablet   Other Relevant Orders   WET PREP FOR TRICH,  YEAST, CLUE (Completed)   GC/Chlamydia Probe Amp (Completed)   Candidal intertrigo       Yeast with dermatitis as well, given lotrisone , remove bra , no tight fitting clothing to help heal   Relevant Medications   clotrimazole-betamethasone (LOTRISONE) cream   fluconazole (DIFLUCAN) 150 MG tablet   metroNIDAZOLE (FLAGYL) 500 MG tablet   Other Relevant Orders   Basic metabolic panel (Completed)      Note: This dictation was prepared with Dragon dictation along with smaller phrase technology. Any transcriptional errors that result from this process are unintentional.

## 2016-08-02 LAB — BASIC METABOLIC PANEL
BUN: 19 mg/dL (ref 7–25)
CO2: 26 mmol/L (ref 20–31)
Calcium: 8.9 mg/dL (ref 8.6–10.2)
Chloride: 104 mmol/L (ref 98–110)
Creat: 1 mg/dL (ref 0.50–1.10)
GLUCOSE: 87 mg/dL (ref 70–99)
POTASSIUM: 4.3 mmol/L (ref 3.5–5.3)
SODIUM: 140 mmol/L (ref 135–146)

## 2016-08-02 LAB — GC/CHLAMYDIA PROBE AMP
CT Probe RNA: NOT DETECTED
GC Probe RNA: NOT DETECTED

## 2016-08-03 ENCOUNTER — Encounter: Payer: Self-pay | Admitting: Family Medicine

## 2016-09-03 ENCOUNTER — Encounter: Payer: Self-pay | Admitting: Family Medicine

## 2016-11-19 ENCOUNTER — Encounter: Payer: Self-pay | Admitting: Family Medicine

## 2017-01-06 DIAGNOSIS — R21 Rash and other nonspecific skin eruption: Secondary | ICD-10-CM | POA: Diagnosis not present

## 2017-01-06 DIAGNOSIS — L259 Unspecified contact dermatitis, unspecified cause: Secondary | ICD-10-CM | POA: Diagnosis not present

## 2017-01-07 ENCOUNTER — Encounter: Payer: Self-pay | Admitting: Family Medicine

## 2018-10-22 ENCOUNTER — Ambulatory Visit (INDEPENDENT_AMBULATORY_CARE_PROVIDER_SITE_OTHER): Payer: No Typology Code available for payment source | Admitting: Family Medicine

## 2018-10-22 ENCOUNTER — Other Ambulatory Visit: Payer: Self-pay

## 2018-10-22 VITALS — BP 114/70 | HR 82 | Temp 98.5°F | Resp 18 | Ht 62.25 in | Wt 210.6 lb

## 2018-10-22 DIAGNOSIS — Z0000 Encounter for general adult medical examination without abnormal findings: Secondary | ICD-10-CM

## 2018-10-22 DIAGNOSIS — Z124 Encounter for screening for malignant neoplasm of cervix: Secondary | ICD-10-CM

## 2018-10-22 DIAGNOSIS — Z6838 Body mass index (BMI) 38.0-38.9, adult: Secondary | ICD-10-CM

## 2018-10-22 DIAGNOSIS — Z0001 Encounter for general adult medical examination with abnormal findings: Secondary | ICD-10-CM | POA: Diagnosis not present

## 2018-10-22 DIAGNOSIS — R251 Tremor, unspecified: Secondary | ICD-10-CM

## 2018-10-22 DIAGNOSIS — Z30430 Encounter for insertion of intrauterine contraceptive device: Secondary | ICD-10-CM

## 2018-10-22 NOTE — Patient Instructions (Signed)
I recommend eye visit once a year I recommend dental visit every 6 months Goal is to  Exercise 30 minutes 5 days a week You have been referred to GYN for Mirena  F/U 1 year for Physical

## 2018-10-22 NOTE — Progress Notes (Signed)
   Subjective:    Patient ID: Candace [name], female    DOB: [birth year], 33 y.o.   MRN: [id number]  Patient presents for Annual Exam (and PAP)    Pt here for CPE , due for PAP Smear ,last visit 2 years ago   Needs Mirena IUD taken out and new one replaced No concerns today   No current medication   Due for fasting    TDAP- UTD in 2013  Now works for [employer]    History of abnormal PAP smear   Still has shaking episodes of hands on and off.   Occurs randomly, does not drop items    She cant make herself stop when she does tremor   No head bob  Interesting in seeing neurology    Review Of Systems:  GEN- denies fatigue, fever, weight loss,weakness, recent illness HEENT- denies eye drainage, change in vision, nasal discharge, CVS- denies chest pain, palpitations RESP- denies SOB, cough, wheeze ABD- denies N/V, change in stools, abd pain GU- denies dysuria, hematuria, dribbling, incontinence MSK- denies joint pain, muscle aches, injury Neuro- denies headache, dizziness, syncope, seizure activity       Objective:    BP 114/70   Pulse 82   Temp 98.5 F (36.9 C)   Resp 18   Ht 5' 2.25" (1.581 m)   Wt 210 lb 9.6 oz (95.5 kg)   SpO2 98%   BMI 38.21 kg/m  GEN- NAD, alert and oriented x3 HEENT- PERRL, EOMI, non injected sclera, pink conjunctiva, MMM, oropharynx clear Neck- Supple, no thyromegaly CVS- RRR, no murmur RESP-CTAB ABD-NABS,soft,NT,ND Breast- normal symmetry, no nipple inversion,no nipple drainage, no nodules or lumps felt Nodes- no axillary nodes GU- normal external genitalia, vaginal mucosa pink and moist, cervix visualized no growth, no blood form os, no discharge, no CMT, no ovarian masses, uterus normal size NEURO-CNII-XII in tact, occ bilat tremor of hand, no other deficits  EXT- No edema Pulses- Radial, DP- 2+        Assessment & Plan:      Problem List Items Addressed This Visit      Unprioritized   Obesity    discusse dietary  changes Recheck lipids, fasting labs  History of anemia and elevated CHOLESTEROL       Other Visit Diagnoses    Routine general medical examination at a health care facility    -  Primary   CPE done, PAP Smear obtained , Referral GYN to get MIRENA replaced   Relevant Orders   CBC with Differential/Platelet (Completed)   Comprehensive metabolic panel (Completed)   Lipid panel (Completed)   TSH (Completed)   Cervical cancer screening       Relevant Orders   Pap IG w/ reflex to HPV when ASC-U   Tremor of both hands       No family history, present for years, no change with improving glucose, no known thyroid disorder   Relevant Orders   TSH (Completed)   Encounter for insertion of mirena IUD       Relevant Orders   Ambulatory referral to Gynecology      Note: This dictation was prepared with Dragon dictation along with smaller phrase technology. Any transcriptional errors that result from this process are unintentional.

## 2018-10-23 ENCOUNTER — Encounter: Payer: Self-pay | Admitting: Family Medicine

## 2018-10-23 LAB — TSH: TSH: 0.92 mIU/L

## 2018-10-23 LAB — COMPREHENSIVE METABOLIC PANEL
AG Ratio: 1.7 (calc) (ref 1.0–2.5)
ALT: 37 U/L — ABNORMAL HIGH (ref 6–29)
AST: 27 U/L (ref 10–30)
Albumin: 4 g/dL (ref 3.6–5.1)
Alkaline phosphatase (APISO): 59 U/L (ref 31–125)
BUN: 13 mg/dL (ref 7–25)
CO2: 25 mmol/L (ref 20–32)
Calcium: 8.8 mg/dL (ref 8.6–10.2)
Chloride: 106 mmol/L (ref 98–110)
Creat: 0.87 mg/dL (ref 0.50–1.10)
Globulin: 2.3 g/dL (calc) (ref 1.9–3.7)
Glucose, Bld: 81 mg/dL (ref 65–99)
Potassium: 4.8 mmol/L (ref 3.5–5.3)
Sodium: 138 mmol/L (ref 135–146)
Total Bilirubin: 0.6 mg/dL (ref 0.2–1.2)
Total Protein: 6.3 g/dL (ref 6.1–8.1)

## 2018-10-23 LAB — CBC WITH DIFFERENTIAL/PLATELET
Absolute Monocytes: 387 cells/uL (ref 200–950)
Basophils Absolute: 41 cells/uL (ref 0–200)
Basophils Relative: 0.9 %
Eosinophils Absolute: 99 cells/uL (ref 15–500)
Eosinophils Relative: 2.2 %
HCT: 38.6 % (ref 35.0–45.0)
Hemoglobin: 12.9 g/dL (ref 11.7–15.5)
Lymphs Abs: 1827 cells/uL (ref 850–3900)
MCH: 28.7 pg (ref 27.0–33.0)
MCHC: 33.4 g/dL (ref 32.0–36.0)
MCV: 86 fL (ref 80.0–100.0)
MPV: 11.6 fL (ref 7.5–12.5)
Monocytes Relative: 8.6 %
Neutro Abs: 2147 cells/uL (ref 1500–7800)
Neutrophils Relative %: 47.7 %
Platelets: 255 10*3/uL (ref 140–400)
RBC: 4.49 10*6/uL (ref 3.80–5.10)
RDW: 12.9 % (ref 11.0–15.0)
Total Lymphocyte: 40.6 %
WBC: 4.5 10*3/uL (ref 3.8–10.8)

## 2018-10-23 LAB — LIPID PANEL
Cholesterol: 196 mg/dL (ref <200)
HDL: 65 mg/dL (ref >=50)
LDL Cholesterol (Calc): 121 mg/dL (calc) — ABNORMAL HIGH
Non-HDL Cholesterol (Calc): 131 mg/dL (calc) — ABNORMAL HIGH (ref <130)
Total CHOL/HDL Ratio: 3 (calc) (ref <5.0)
Triglycerides: 32 mg/dL (ref <150)

## 2018-10-23 NOTE — Assessment & Plan Note (Signed)
discusse dietary changes Recheck lipids, fasting labs  History of anemia and elevated CHOLESTEROL

## 2018-10-25 LAB — PAP IG W/ RFLX HPV ASCU

## 2018-10-27 ENCOUNTER — Encounter: Payer: Self-pay | Admitting: Neurology

## 2018-11-03 ENCOUNTER — Ambulatory Visit: Payer: BLUE CROSS/BLUE SHIELD | Admitting: Women's Health

## 2018-11-08 ENCOUNTER — Ambulatory Visit (INDEPENDENT_AMBULATORY_CARE_PROVIDER_SITE_OTHER): Payer: No Typology Code available for payment source | Admitting: Obstetrics & Gynecology

## 2018-11-08 ENCOUNTER — Other Ambulatory Visit: Payer: Self-pay

## 2018-11-08 ENCOUNTER — Encounter: Payer: Self-pay | Admitting: Obstetrics & Gynecology

## 2018-11-08 ENCOUNTER — Other Ambulatory Visit: Payer: Self-pay | Admitting: Obstetrics & Gynecology

## 2018-11-08 VITALS — BP 97/67 | HR 96 | Ht 62.4 in | Wt 214.2 lb

## 2018-11-08 DIAGNOSIS — Z3202 Encounter for pregnancy test, result negative: Secondary | ICD-10-CM | POA: Diagnosis not present

## 2018-11-08 DIAGNOSIS — D060 Carcinoma in situ of endocervix: Secondary | ICD-10-CM

## 2018-11-08 DIAGNOSIS — N870 Mild cervical dysplasia: Secondary | ICD-10-CM

## 2018-11-08 NOTE — Progress Notes (Signed)
Colposcopy Procedure Note:  Colposcopy Procedure Note  Indications: Pap smear 1 months ago showed: low-grade squamous intraepithelial neoplasia (LGSIL - encompassing HPV,mild dysplasia,CIN I). The prior pap showed high-grade squamous intraepithelial neoplasia  (HGSIL-encompassing moderate and severe dysplasia).  Prior cervical/vaginal disease: unsure . Prior cervical treatment: no treatment.  Smoker:  No. New sexual partner:  No.  : time frame:    History of abnormal Pap: yes last one in 2016  Procedure Details  The risks and benefits of the procedure and Written informed consent obtained.  Speculum placed in vagina and excellent visualization of cervix achieved, cervix swabbed x 3 with acetic acid solution.  Findings: Cervix: visible lesion(s) at 9-3 o'clock, acetowhite lesion(s) noted at 9-3 o'clock and punctation noted at 9-3 o'clock; SCJ visualized - lesion at 12 o'clock, cervical biopsies taken at 12 o'clock, specimen labelled and sent to pathology and hemostasis achieved with Monsel's solution. Vaginal inspection: normal without visible lesions. Vulvar colposcopy: vulvar colposcopy not performed.  Specimens: cervical biopsy at 12 o'clock  Complications: none.  Plan: Return to discuss Pathology results in 2 weeks.  also wants IUD swapped out

## 2018-11-11 NOTE — Progress Notes (Signed)
Subjective:   Candace HalesKimberly A [name] was seen in consultation in the movement disorder clinic at the request of Jeanice Limurham, Velna HatchetKawanta F, MD.  The evaluation is for tremor.  Pt unable to state how long tremor it has been going on - "my family notices it."  Cannot state when tremor is most noticeable.  There is no family hx of tremor.  The records that were made available to me were reviewed.  Affected by caffeine:  Doesn't drink enough to know (1 cup tea per day) Affected by alcohol: doesn't drink enough to know (drinks 1 shot per month) Affected by stress:  unknown Affected by fatigue:  unknown Spills soup if on spoon: may or may not Spills glass of liquid if full:  No. Affects ADL's (tying shoes, brushing teeth, etc):  No.  Current/Previously tried tremor medications: none  Current medications that may exacerbate tremor:  none  Outside reports reviewed: historical medical records, lab reports and referral letter/letters. No hx of neuroimaging of brain  Allergies  Allergen Reactions  . Vicodin [Hydrocodone-Acetaminophen] Other (See Comments)    violent  . Percocet [Oxycodone-Acetaminophen]     Violent behaviors  . Latex Itching and Rash    Outpatient Encounter Medications as of 11/18/2018  Medication Sig  . levonorgestrel (MIRENA) 20 MCG/24HR IUD 1 each by Intrauterine route once.   No facility-administered encounter medications on file as of 11/18/2018.     Past Medical History:  Diagnosis Date  . Allergy   . Anemia   . BV (bacterial vaginosis) 12/15/2012  . Diabetes (HCC)    borderline DM per patient - denies DM  . Eczema   . Headache(784.0)   . History of chlamydia   . History of gonorrhea   . Irregular heartbeat   . Vaginal itching 12/15/2012  . Vaginal Pap smear, abnormal    f/u ok    Past Surgical History:  Procedure Laterality Date  . DILATION AND CURETTAGE OF UTERUS N/A 11/22/2012   Procedure: SUCTION DILATATION AND CURETTAGE;  Surgeon: Lazaro ArmsLuther H Eure, MD;  Location: [facility];  Service: Gynecology;  Laterality: N/A;    Social History   Socioeconomic History  . Marital status: Single    Spouse name: Not on file  . Number of children: 2  . Years of education: Not on file  . Highest education level: Some college, no degree  Occupational History  . Occupation: CNA    Comment: [employer]  Social Needs  . Financial resource strain: Not on file  . Food insecurity    Worry: Not on file    Inability: Not on file  . Transportation needs    Medical: Not on file    Non-medical: Not on file  Tobacco Use  . Smoking status: Never Smoker  . Smokeless tobacco: Never Used  Substance and Sexual Activity  . Alcohol use: Yes    Comment: 1 shot per month  . Drug use: No  . Sexual activity: Yes    Birth control/protection: I.U.D.  Lifestyle  . Physical activity    Days per week: Not on file    Minutes per session: Not on file  . Stress: Not on file  Relationships  . Social Musicianconnections    Talks on phone: Not on file    Gets together: Not on file    Attends religious service: Not on file    Active member of club or organization: Not on file    Attends meetings of clubs or organizations:  Not on file    Relationship status: Not on file  . Intimate partner violence    Fear of current or ex partner: Not on file    Emotionally abused: Not on file    Physically abused: Not on file    Forced sexual activity: Not on file  Other Topics Concern  . Not on file  Social History Narrative  . Not on file    Family Status  Relation Name Status  . Mother  Alive  . PGF  Deceased  . PGM  Deceased  . MGF  Deceased  . Father  Alive  . Brother  Alive  . MGM  Alive  . Daughter  Alive  . Son  Alive  . Mat Aunt  (Not Specified)  . Other  (Not Specified)  . Emelda BrothersPat Aunt  (Not Specified)  . Oneal GroutPat Uncle  (Not Specified)    Review of Systems ROS   Objective:   VITALS:   Vitals:   11/18/18 0911  BP: 118/80  Pulse: 86  Temp: 97.6 F (36.4 C)  SpO2: 99%   Weight: 214 lb 3.2 oz (97.2 kg)  Height: 5' 2.5" (1.588 m)   Gen:  Appears stated age and in NAD. HEENT:  Normocephalic, atraumatic. The mucous membranes are moist. The superficial temporal arteries are without ropiness or tenderness. Cardiovascular: Regular rate and rhythm. Lungs: Clear to auscultation bilaterally. Neck: There are no carotid bruits noted bilaterally.  NEUROLOGICAL:  Orientation:  The patient is alert and oriented x 3.  Recent and remote memory are intact.  Attention span and concentration are normal.  Able to name objects and repeat without trouble.  Fund of knowledge is appropriate Cranial nerves: There is good facial symmetry.  Extraocular muscles are intact and visual fields are full to confrontational testing. Speech is fluent and clear. Soft palate rises symmetrically and there is no tongue deviation. Hearing is intact to conversational tone. Tone: Tone is good throughout. Sensation: Sensation is intact to light touch and pinprick throughout (facial, trunk, extremities). Vibration is intact at the bilateral big toe. There is no extinction with double simultaneous stimulation. There is no sensory dermatomal level identified. Coordination:  The patient has no dysdiadichokinesia or dysmetria. Motor: Strength is 5/5 in the bilateral upper and lower extremities.  Shoulder shrug is equal bilaterally.  There is no pronator drift.  There are no fasciculations noted. DTR's: Deep tendon reflexes are 2-/4 at the bilateral biceps, triceps, brachioradialis, patella and achilles.  Plantar responses are downgoing bilaterally. Gait and Station: The patient is able to ambulate without difficulty. The patient is able to heel toe walk without any difficulty. The patient is able to ambulate in a tandem fashion. The patient is able to stand in the Romberg position.   MOVEMENT EXAM: Tremor:  There is very little intention tremor (very minor).  No postural tremor.   The patient is able to draw  Archimedes spirals without significant difficulty.  There is no tremor at rest.  The patient is able to pour water from one glass to another without spilling it.  Lab Results  Component Value Date   TSH 0.92 10/22/2018     Chemistry      Component Value Date/Time   NA 138 10/22/2018 1140   K 4.8 10/22/2018 1140   CL 106 10/22/2018 1140   CO2 25 10/22/2018 1140   BUN 13 10/22/2018 1140   CREATININE 0.87 10/22/2018 1140      Component Value Date/Time   CALCIUM  8.8 10/22/2018 1140   ALKPHOS 63 10/12/2015 1009   AST 27 10/22/2018 1140   ALT 37 (H) 10/22/2018 1140   BILITOT 0.6 10/22/2018 1140          Assessment/Plan:   1.  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  She and I discussed the nature and pathophysiology.  At this point, no treatment is recommended (especially since patient does not notice it).  She will let me know if it becomes more bothersome.  Pt education is provided.   CC:  [place], Modena Nunnery, MD

## 2018-11-18 ENCOUNTER — Ambulatory Visit (INDEPENDENT_AMBULATORY_CARE_PROVIDER_SITE_OTHER): Payer: No Typology Code available for payment source | Admitting: Neurology

## 2018-11-18 ENCOUNTER — Encounter: Payer: Self-pay | Admitting: Neurology

## 2018-11-18 ENCOUNTER — Other Ambulatory Visit: Payer: Self-pay

## 2018-11-18 VITALS — BP 118/80 | HR 86 | Temp 97.6°F | Ht 62.5 in | Wt 214.2 lb

## 2018-11-18 DIAGNOSIS — R251 Tremor, unspecified: Secondary | ICD-10-CM

## 2018-11-18 DIAGNOSIS — R7303 Prediabetes: Secondary | ICD-10-CM | POA: Insufficient documentation

## 2018-11-18 DIAGNOSIS — E119 Type 2 diabetes mellitus without complications: Secondary | ICD-10-CM | POA: Insufficient documentation

## 2018-11-18 NOTE — Patient Instructions (Signed)
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  At this point, we have decided to hold off on any medications for this.    The physicians and staff at [facility] are committed to providing excellent care. You may receive a survey requesting feedback about your experience at our office. We strive to receive "very good" responses to the survey questions. If you feel that your experience would prevent you from giving the office a "very good " response, please contact our office to try to remedy the situation. We may be reached at [phone]. Thank you for taking the time out of your busy day to complete the survey.

## 2018-11-22 ENCOUNTER — Telehealth: Payer: Self-pay | Admitting: Obstetrics & Gynecology

## 2018-11-22 NOTE — Telephone Encounter (Signed)

## 2018-11-23 ENCOUNTER — Encounter: Payer: Self-pay | Admitting: Obstetrics & Gynecology

## 2018-11-23 ENCOUNTER — Other Ambulatory Visit: Payer: Self-pay

## 2018-11-23 ENCOUNTER — Ambulatory Visit (INDEPENDENT_AMBULATORY_CARE_PROVIDER_SITE_OTHER): Payer: No Typology Code available for payment source | Admitting: Obstetrics & Gynecology

## 2018-11-23 VITALS — BP 109/72 | HR 97 | Ht 62.0 in | Wt 213.0 lb

## 2018-11-23 DIAGNOSIS — D061 Carcinoma in situ of exocervix: Secondary | ICD-10-CM | POA: Diagnosis not present

## 2018-11-23 NOTE — Progress Notes (Signed)
Follow up appointment for results  Chief Complaint  Patient presents with  . Results    Blood pressure 109/72, pulse 97, height 5\' 2"  (1.575 m), weight 213 lb (96.6 kg).  Cervical biopsy: HSIL/CIS  MEDS ordered this encounter: No orders of the defined types were placed in this encounter.   Orders for this encounter: No orders of the defined types were placed in this encounter.   Impression: HSIL/CIS  Plan: Recommend TVH vs laser conization Pt will decide on treatment options and let me know via my chart in 2 weeks  Information given [email or web address]  Follow Up: No follow-ups on file.       Face to face time:  15 minutes  Greater than 50% of the visit time was spent in counseling and coordination of care with the patient.  The summary and outline of the counseling and care coordination is summarized in the note above.   All questions were answered.  Past Medical History:  Diagnosis Date  . Allergy   . Anemia   . BV (bacterial vaginosis) 12/15/2012  . Diabetes (HCC)    borderline DM per patient - denies DM  . Eczema   . Headache(784.0)   . History of chlamydia   . History of gonorrhea   . Irregular heartbeat   . Vaginal itching 12/15/2012  . Vaginal Pap smear, abnormal    f/u ok    Past Surgical History:  Procedure Laterality Date  . DILATION AND CURETTAGE OF UTERUS N/A 11/22/2012   Procedure: SUCTION DILATATION AND CURETTAGE;  Surgeon: Lazaro ArmsLuther H , MD;  Location: [facility];  Service: Gynecology;  Laterality: N/A;    OB History    Gravida  3   Para  2   Term  2   Preterm      AB  1   Living  2     SAB  1   TAB      Ectopic      Multiple      Live Births  2           Allergies  Allergen Reactions  . Vicodin [Hydrocodone-Acetaminophen] Other (See Comments)    violent  . Percocet [Oxycodone-Acetaminophen]     Violent behaviors  . Latex Itching and Rash    Social History    Socioeconomic History  . Marital status: Single    Spouse name: Not on file  . Number of children: 2  . Years of education: Not on file  . Highest education level: Some college, no degree  Occupational History  . Occupation: CNA    Comment: [employer]  Social Needs  . Financial resource strain: Not on file  . Food insecurity    Worry: Not on file    Inability: Not on file  . Transportation needs    Medical: Not on file    Non-medical: Not on file  Tobacco Use  . Smoking status: Never Smoker  . Smokeless tobacco: Never Used  Substance and Sexual Activity  . Alcohol use: Yes    Comment: 1 shot per month  . Drug use: No  . Sexual activity: Yes    Birth control/protection: I.U.D.  Lifestyle  . Physical activity    Days per week: Not on file    Minutes per session: Not on file  . Stress: Not on file  Relationships  . Social Musicianconnections    Talks on phone: Not on file    Gets together: Not on file  Attends religious service: Not on file    Active member of club or organization: Not on file    Attends meetings of clubs or organizations: Not on file    Relationship status: Not on file  Other Topics Concern  . Not on file  Social History Narrative  . Not on file    Family History  Problem Relation Age of Onset  . Prostate cancer Paternal Grandfather   . Breast cancer Paternal Grandmother   . Lung cancer Paternal Grandmother   . Lung cancer Maternal Grandfather   . Colon polyps Father   . Prostate cancer Father   . Healthy Brother   . Healthy Daughter   . Healthy Son   . Breast cancer Maternal Aunt        Multiple with Breast Cancer  . Cervical cancer Maternal Aunt   . Stroke Maternal Aunt   . Hypertension Other   . Diabetes Other   . Other Other        stomach tumors  . Heart disease Other        her mom, gma and aunts have arrythmia  . Kidney disease Paternal Aunt   . Other Paternal Aunt        colon problems  . Stroke Paternal Uncle         PGreatUncle  . Prostate cancer Paternal Uncle

## 2018-12-16 NOTE — Patient Instructions (Signed)
Bubba HalesKimberly A Pembroke  12/16/2018     @PREFPERIOPPHARMACY @   Your procedure is scheduled on  12/22/2018.  [facility]  [facility]  [facility].M.  Call this number if you have problems the morning of surgery:  [phone]   Remember:  Do not eat or drink after midnight.                         Take these medicines the morning of surgery with A SIP OF WATER None    Do not wear jewelry, make-up or nail polish.  Do not wear lotions, powders, or perfumes, or deodorant.  Do not shave 48 hours prior to surgery.  Men may shave face and neck.  Do not bring valuables to the hospital.  [facility] any belongings or valuables.  Contacts, dentures or bridgework may not be worn into surgery.  Leave your suitcase in the car.  After surgery it may be brought to your room.  For patients admitted to the hospital, discharge time will be determined by your treatment team.  Patients discharged the day of surgery will not be allowed to drive home.   Name and phone number of your driver:   family Special instructions:  None  Please read over the following fact sheets that you were given. Anesthesia Post-op Instructions and Care and Recovery After Surgery       Cervical Conization, Care After This sheet gives you information about how to care for yourself after your procedure. Your doctor may also give you more specific instructions. If you have problems or questions, contact your doctor. Follow these instructions at home: Medicines   Take over-the-counter and prescription medicines only as told by your doctor.  Do not take aspirin until your doctor says it is okay.  If you take pain medicine: ? You may have constipation. To help treat this, your doctor may tell you to:  Drink enough fluid to keep your pee (urine) clear or pale yellow.  Take medicines.  Eat foods that are high in fiber. These include fresh fruits and vegetables, whole grains, bran, and  beans.  Limit foods that are high in fat and sugar. These include fried foods and sweet foods. ? Do not drive or use heavy machines. General instructions  You can eat your usual diet unless your doctor tells you not to do so.  Take showers for the first week. Do not take baths, swim, or use hot tubs until your doctor says it is okay.  Do not douche, use tampons, or have sex until your doctor says it is okay.  For 7-14 days after your procedure, avoid: ? Being very active. ? Exercising. ? Heavy lifting.  Keep all follow-up visits as told by your doctor. This is important. Contact a doctor if:  You have a rash.  You are dizzy or lightheaded.  You feel sick to your stomach (nauseous).  You throw up (vomit).  You have fluid from your vagina (vaginal discharge) that smells bad. Get help right away if:  There are blood clots coming from your vagina.  You have more bleeding than you would have in a normal period. For example, you soak a pad in less than 1 hour.  You have a fever.  You have more and more cramps.  You pass out (faint).  You have pain when peeing.  Your have a lot of pain.  Your pain gets worse.  Your pain does not get better when you take your medicine.  You have blood in your pee.  You throw up (vomit). Summary  After your procedure, take over-the-counter and prescription medicines only as told by your doctor.  Do not douche, use tampons, or have sex until your doctor says it is okay.  For about 7-14 days after your procedure, try not to exercise or lift heavy objects.  Get help right away if you have new symptoms, or if your symptoms become worse. This information is not intended to replace advice given to you by your health care provider. Make sure you discuss any questions you have with your health care provider. Document Released: 02/05/2008 Document Revised: 04/10/2017 Document Reviewed: 04/30/2016 Elsevier Patient Education  2020 Elsevier  Inc.  General Anesthesia, Adult, Care After This sheet gives you information about how to care for yourself after your procedure. Your health care provider may also give you more specific instructions. If you have problems or questions, contact your health care provider. What can I expect after the procedure? After the procedure, the following side effects are common:  Pain or discomfort at the IV site.  Nausea.  Vomiting.  Sore throat.  Trouble concentrating.  Feeling cold or chills.  Weak or tired.  Sleepiness and fatigue.  Soreness and body aches. These side effects can affect parts of the body that were not involved in surgery. Follow these instructions at home:  For at least 24 hours after the procedure:  Have a responsible adult stay with you. It is important to have someone help care for you until you are awake and alert.  Rest as needed.  Do not: ? Participate in activities in which you could fall or become injured. ? Drive. ? Use heavy machinery. ? Drink alcohol. ? Take sleeping pills or medicines that cause drowsiness. ? Make important decisions or sign legal documents. ? Take care of children on your own. Eating and drinking  Follow any instructions from your health care provider about eating or drinking restrictions.  When you feel hungry, start by eating small amounts of foods that are soft and easy to digest (bland), such as toast. Gradually return to your regular diet.  Drink enough fluid to keep your urine pale yellow.  If you vomit, rehydrate by drinking water, juice, or clear broth. General instructions  If you have sleep apnea, surgery and certain medicines can increase your risk for breathing problems. Follow instructions from your health care provider about wearing your sleep device: ? Anytime you are sleeping, including during daytime naps. ? While taking prescription pain medicines, sleeping medicines, or medicines that make you  drowsy.  Return to your normal activities as told by your health care provider. Ask your health care provider what activities are safe for you.  Take over-the-counter and prescription medicines only as told by your health care provider.  If you smoke, do not smoke without supervision.  Keep all follow-up visits as told by your health care provider. This is important. Contact a health care provider if:  You have nausea or vomiting that does not get better with medicine.  You cannot eat or drink without vomiting.  You have pain that does not get better with medicine.  You are unable to pass urine.  You develop a skin rash.  You have a fever.  You have redness around your IV site that gets worse. Get help right away if:  You have difficulty breathing.  You have chest pain.  You have blood in your urine or stool, or you vomit blood. Summary  After the procedure, it is common to have a sore throat or nausea. It is also common to feel tired.  Have a responsible adult stay with you for the first 24 hours after general anesthesia. It is important to have someone help care for you until you are awake and alert.  When you feel hungry, start by eating small amounts of foods that are soft and easy to digest (bland), such as toast. Gradually return to your regular diet.  Drink enough fluid to keep your urine pale yellow.  Return to your normal activities as told by your health care provider. Ask your health care provider what activities are safe for you. This information is not intended to replace advice given to you by your health care provider. Make sure you discuss any questions you have with your health care provider. Document Released: 08/04/2000 Document Revised: 05/01/2017 Document Reviewed: 12/12/2016 Elsevier Patient Education  2020 [employer] How to Use Chlorhexidine for Bathing Chlorhexidine gluconate (CHG) is a germ-killing (antiseptic) solution that is used to clean  the skin. It can get rid of the bacteria that normally live on the skin and can keep them away for about 24 hours. To clean your skin with CHG, you may be given:  A CHG solution to use in the shower or as part of a sponge bath.  A prepackaged cloth that contains CHG. Cleaning your skin with CHG may help lower the risk for infection:  While you are staying in the intensive care unit of the hospital.  If you have a vascular access, such as a central line, to provide short-term or long-term access to your veins.  If you have a catheter to drain urine from your bladder.  If you are on a ventilator. A ventilator is a machine that helps you breathe by moving air in and out of your lungs.  After surgery. What are the risks? Risks of using CHG include:  A skin reaction.  Hearing loss, if CHG gets in your ears.  Eye injury, if CHG gets in your eyes and is not rinsed out.  The CHG product catching fire. Make sure that you avoid smoking and flames after applying CHG to your skin. Do not use CHG:  If you have a chlorhexidine allergy or have previously reacted to chlorhexidine.  On babies younger than 832 months of age. How to use CHG solution  Use CHG only as told by your health care provider, and follow the instructions on the label.  Use the full amount of CHG as directed. Usually, this is one bottle. During a shower Follow these steps when using CHG solution during a shower (unless your health care provider gives you different instructions): 1. Start the shower. 2. Use your normal soap and shampoo to wash your face and hair. 3. Turn off the shower or move out of the shower stream. 4. Pour the CHG onto a clean washcloth. Do not use any type of brush or rough-edged sponge. 5. Starting at your neck, lather your body down to your toes. Make sure you follow these instructions: ? If you will be having surgery, pay special attention to the part of your body where you will be having surgery.  Scrub this area for at least 1 minute. ? Do not use CHG on your head or face. If the solution gets into your ears or eyes, rinse them  well with water. ? Avoid your genital area. ? Avoid any areas of skin that have broken skin, cuts, or scrapes. ? Scrub your back and under your arms. Make sure to wash skin folds. 6. Let the lather sit on your skin for 1-2 minutes or as long as told by your health care provider. 7. Thoroughly rinse your entire body in the shower. Make sure that all body creases and crevices are rinsed well. 8. Dry off with a clean towel. Do not put any substances on your body afterward--such as powder, lotion, or perfume--unless you are told to do so by your health care provider. Only use lotions that are recommended by the manufacturer. 9. Put on clean clothes or pajamas. 10. If it is the night before your surgery, sleep in clean sheets.  During a sponge bath Follow these steps when using CHG solution during a sponge bath (unless your health care provider gives you different instructions): 1. Use your normal soap and shampoo to wash your face and hair. 2. Pour the CHG onto a clean washcloth. 3. Starting at your neck, lather your body down to your toes. Make sure you follow these instructions: ? If you will be having surgery, pay special attention to the part of your body where you will be having surgery. Scrub this area for at least 1 minute. ? Do not use CHG on your head or face. If the solution gets into your ears or eyes, rinse them well with water. ? Avoid your genital area. ? Avoid any areas of skin that have broken skin, cuts, or scrapes. ? Scrub your back and under your arms. Make sure to wash skin folds. 4. Let the lather sit on your skin for 1-2 minutes or as long as told by your health care provider. 5. Using a different clean, wet washcloth, thoroughly rinse your entire body. Make sure that all body creases and crevices are rinsed well. 6. Dry off with a clean towel.  Do not put any substances on your body afterward--such as powder, lotion, or perfume--unless you are told to do so by your health care provider. Only use lotions that are recommended by the manufacturer. 7. Put on clean clothes or pajamas. 8. If it is the night before your surgery, sleep in clean sheets. How to use CHG prepackaged cloths  Only use CHG cloths as told by your health care provider, and follow the instructions on the label.  Use the CHG cloth on clean, dry skin.  Do not use the CHG cloth on your head or face unless your health care provider tells you to.  When washing with the CHG cloth: ? Avoid your genital area. ? Avoid any areas of skin that have broken skin, cuts, or scrapes. Before surgery Follow these steps when using a CHG cloth to clean before surgery (unless your health care provider gives you different instructions): 1. Using the CHG cloth, vigorously scrub the part of your body where you will be having surgery. Scrub using a back-and-forth motion for 3 minutes. The area on your body should be completely wet with CHG when you are done scrubbing. 2. Do not rinse. Discard the cloth and let the area air-dry. Do not put any substances on the area afterward, such as powder, lotion, or perfume. 3. Put on clean clothes or pajamas. 4. If it is the night before your surgery, sleep in clean sheets.  For general bathing Follow these steps when using CHG cloths for general bathing (unless your  health care provider gives you different instructions). 1. Use a separate CHG cloth for each area of your body. Make sure you wash between any folds of skin and between your fingers and toes. Wash your body in the following order, switching to a new cloth after each step: ? The front of your neck, shoulders, and chest. ? Both of your arms, under your arms, and your hands. ? Your stomach and groin area, avoiding the genitals. ? Your right leg and foot. ? Your left leg and foot. ? The back  of your neck, your back, and your buttocks. 2. Do not rinse. Discard the cloth and let the area air-dry. Do not put any substances on your body afterward--such as powder, lotion, or perfume--unless you are told to do so by your health care provider. Only use lotions that are recommended by the manufacturer. 3. Put on clean clothes or pajamas. Contact a health care provider if:  Your skin gets irritated after scrubbing.  You have questions about using your solution or cloth. Get help right away if:  Your eyes become very red or swollen.  Your eyes itch badly.  Your skin itches badly and is red or swollen.  Your hearing changes.  You have trouble seeing.  You have swelling or tingling in your mouth or throat.  You have trouble breathing.  You swallow any chlorhexidine. Summary  Chlorhexidine gluconate (CHG) is a germ-killing (antiseptic) solution that is used to clean the skin. Cleaning your skin with CHG may help to lower your risk for infection.  You may be given CHG to use for bathing. It may be in a bottle or in a prepackaged cloth to use on your skin. Carefully follow your health care provider's instructions and the instructions on the product label.  Do not use CHG if you have a chlorhexidine allergy.  Contact your health care provider if your skin gets irritated after scrubbing. This information is not intended to replace advice given to you by your health care provider. Make sure you discuss any questions you have with your health care provider. Document Released: 01/21/2012 Document Revised: 07/15/2018 Document Reviewed: 03/26/2017 Elsevier Patient Education  2020 [employer].

## 2018-12-20 ENCOUNTER — Other Ambulatory Visit (HOSPITAL_COMMUNITY)
Admission: RE | Admit: 2018-12-20 | Discharge: 2018-12-20 | Disposition: A | Discharge status: Home / Self Care | Payer: No Typology Code available for payment source | Source: Ambulatory Visit | Attending: Obstetrics & Gynecology | Admitting: Obstetrics & Gynecology

## 2018-12-20 ENCOUNTER — Other Ambulatory Visit: Payer: Self-pay

## 2018-12-20 ENCOUNTER — Encounter (HOSPITAL_COMMUNITY)
Admission: RE | Admit: 2018-12-20 | Discharge: 2018-12-20 | Disposition: A | Discharge status: Home / Self Care | Payer: No Typology Code available for payment source | Source: Ambulatory Visit | Attending: Obstetrics & Gynecology | Admitting: Obstetrics & Gynecology

## 2018-12-20 ENCOUNTER — Encounter (HOSPITAL_COMMUNITY): Payer: Self-pay

## 2018-12-20 ENCOUNTER — Other Ambulatory Visit: Payer: Self-pay | Admitting: Obstetrics & Gynecology

## 2018-12-20 DIAGNOSIS — R87613 High grade squamous intraepithelial lesion on cytologic smear of cervix (HGSIL): Secondary | ICD-10-CM | POA: Diagnosis present

## 2018-12-20 DIAGNOSIS — Z01812 Encounter for preprocedural laboratory examination: Secondary | ICD-10-CM | POA: Diagnosis not present

## 2018-12-20 DIAGNOSIS — E119 Type 2 diabetes mellitus without complications: Secondary | ICD-10-CM | POA: Diagnosis not present

## 2018-12-20 DIAGNOSIS — Z01810 Encounter for preprocedural cardiovascular examination: Secondary | ICD-10-CM | POA: Diagnosis not present

## 2018-12-20 DIAGNOSIS — Z20828 Contact with and (suspected) exposure to other viral communicable diseases: Secondary | ICD-10-CM | POA: Diagnosis not present

## 2018-12-20 LAB — COMPREHENSIVE METABOLIC PANEL
ALT: 43 U/L (ref 0–44)
AST: 27 U/L (ref 15–41)
Albumin: 4.2 g/dL (ref 3.5–5.0)
Alkaline Phosphatase: 61 U/L (ref 38–126)
Anion gap: 8 (ref 5–15)
BUN: 13 mg/dL (ref 6–20)
CO2: 25 mmol/L (ref 22–32)
Calcium: 9.1 mg/dL (ref 8.9–10.3)
Chloride: 105 mmol/L (ref 98–111)
Creatinine, Ser: 0.83 mg/dL (ref 0.44–1.00)
GFR calc Af Amer: >60 mL/min (ref >60)
GFR calc non Af Amer: >60 mL/min (ref >60)
Glucose, Bld: 106 mg/dL — ABNORMAL HIGH (ref 70–99)
Potassium: 3.9 mmol/L (ref 3.5–5.1)
Sodium: 138 mmol/L (ref 135–145)
Total Bilirubin: 0.9 mg/dL (ref 0.3–1.2)
Total Protein: 7.3 g/dL (ref 6.5–8.1)

## 2018-12-20 LAB — CBC
HCT: 42.1 % (ref 36.0–46.0)
Hemoglobin: 13.3 g/dL (ref 12.0–15.0)
MCH: 28.5 pg (ref 26.0–34.0)
MCHC: 31.6 g/dL (ref 30.0–36.0)
MCV: 90.1 fL (ref 80.0–100.0)
Platelets: 262 10*3/uL (ref 150–400)
RBC: 4.67 MIL/uL (ref 3.87–5.11)
RDW: 13.5 % (ref 11.5–15.5)
WBC: 6.1 10*3/uL (ref 4.0–10.5)
nRBC: 0 % (ref 0.0–0.2)

## 2018-12-20 LAB — URINALYSIS, ROUTINE W REFLEX MICROSCOPIC
Bilirubin Urine: NEGATIVE
Glucose, UA: NEGATIVE mg/dL
Hgb urine dipstick: NEGATIVE
Ketones, ur: NEGATIVE mg/dL
Leukocytes,Ua: NEGATIVE
Nitrite: NEGATIVE
Protein, ur: NEGATIVE mg/dL
Specific Gravity, Urine: 1.019 (ref 1.005–1.030)
pH: 5 (ref 5.0–8.0)

## 2018-12-20 LAB — RAPID HIV SCREEN (HIV 1/2 AB+AG)
HIV 1/2 Antibodies: NONREACTIVE
HIV-1 P24 Antigen - HIV24: NONREACTIVE

## 2018-12-20 LAB — HCG, QUANTITATIVE, PREGNANCY: hCG, Beta Chain, Quant, S: <1 m[IU]/mL (ref <5)

## 2018-12-20 LAB — SARS CORONAVIRUS 2 (TAT 6-24 HRS): SARS Coronavirus 2: NEGATIVE

## 2018-12-22 ENCOUNTER — Ambulatory Visit (HOSPITAL_COMMUNITY): Payer: No Typology Code available for payment source | Admitting: Anesthesiology

## 2018-12-22 ENCOUNTER — Encounter (HOSPITAL_COMMUNITY)
Admission: RE | Disposition: A | Discharge status: Home / Self Care | Payer: Self-pay | Source: Home / Self Care | Attending: Obstetrics & Gynecology

## 2018-12-22 ENCOUNTER — Other Ambulatory Visit: Payer: Self-pay

## 2018-12-22 ENCOUNTER — Encounter (HOSPITAL_COMMUNITY): Payer: Self-pay

## 2018-12-22 ENCOUNTER — Ambulatory Visit (HOSPITAL_COMMUNITY)
Admission: RE | Admit: 2018-12-22 | Discharge: 2018-12-22 | Disposition: A | Discharge status: Home / Self Care | Payer: No Typology Code available for payment source | Attending: Obstetrics & Gynecology | Admitting: Obstetrics & Gynecology

## 2018-12-22 DIAGNOSIS — Z01812 Encounter for preprocedural laboratory examination: Secondary | ICD-10-CM | POA: Insufficient documentation

## 2018-12-22 DIAGNOSIS — D069 Carcinoma in situ of cervix, unspecified: Secondary | ICD-10-CM | POA: Diagnosis not present

## 2018-12-22 DIAGNOSIS — R87613 High grade squamous intraepithelial lesion on cytologic smear of cervix (HGSIL): Secondary | ICD-10-CM | POA: Diagnosis not present

## 2018-12-22 DIAGNOSIS — Z01810 Encounter for preprocedural cardiovascular examination: Secondary | ICD-10-CM | POA: Insufficient documentation

## 2018-12-22 DIAGNOSIS — Z20828 Contact with and (suspected) exposure to other viral communicable diseases: Secondary | ICD-10-CM | POA: Insufficient documentation

## 2018-12-22 DIAGNOSIS — E119 Type 2 diabetes mellitus without complications: Secondary | ICD-10-CM | POA: Insufficient documentation

## 2018-12-22 HISTORY — PX: CERVICAL CONIZATION W/BX: SHX1330

## 2018-12-22 SURGERY — CONE BIOPSY, CERVIX
Anesthesia: General

## 2018-12-22 MED ORDER — GLYCOPYRROLATE PF 0.2 MG/ML IJ SOSY
PREFILLED_SYRINGE | INTRAMUSCULAR | Status: AC
  Filled 2018-12-22: qty 1

## 2018-12-22 MED ORDER — ARTIFICIAL TEARS OPHTHALMIC OINT
TOPICAL_OINTMENT | OPHTHALMIC | Status: AC
  Filled 2018-12-22: qty 3.5

## 2018-12-22 MED ORDER — FENTANYL CITRATE (PF) 100 MCG/2ML IJ SOLN
INTRAMUSCULAR | Status: DC | PRN
  Administered 2018-12-22: 50 ug via INTRAVENOUS
  Administered 2018-12-22 (×2): 25 ug via INTRAVENOUS

## 2018-12-22 MED ORDER — LIDOCAINE 2% (20 MG/ML) 5 ML SYRINGE
INTRAMUSCULAR | Status: DC | PRN
  Administered 2018-12-22: 40 mg via INTRAVENOUS

## 2018-12-22 MED ORDER — DEXAMETHASONE SODIUM PHOSPHATE 4 MG/ML IJ SOLN
INTRAMUSCULAR | Status: DC | PRN
  Administered 2018-12-22: 8 mg via INTRAVENOUS

## 2018-12-22 MED ORDER — PHENYLEPHRINE 40 MCG/ML (10ML) SYRINGE FOR IV PUSH (FOR BLOOD PRESSURE SUPPORT)
PREFILLED_SYRINGE | INTRAVENOUS | Status: AC
  Filled 2018-12-22: qty 10

## 2018-12-22 MED ORDER — DEXAMETHASONE SODIUM PHOSPHATE 10 MG/ML IJ SOLN
INTRAMUSCULAR | Status: AC
  Filled 2018-12-22: qty 1

## 2018-12-22 MED ORDER — ONDANSETRON HCL 4 MG/2ML IJ SOLN
INTRAMUSCULAR | Status: DC | PRN
  Administered 2018-12-22: 4 mg via INTRAVENOUS

## 2018-12-22 MED ORDER — CEFAZOLIN SODIUM-DEXTROSE 2-4 GM/100ML-% IV SOLN
2.0000 g | INTRAVENOUS | Status: AC
  Administered 2018-12-22: 09:00:00 2 g via INTRAVENOUS
  Filled 2018-12-22: qty 100

## 2018-12-22 MED ORDER — ACETIC ACID 5 % SOLN
Status: DC | PRN
  Administered 2018-12-22: 1 via TOPICAL

## 2018-12-22 MED ORDER — MIDAZOLAM HCL 2 MG/2ML IJ SOLN
INTRAMUSCULAR | Status: AC
  Filled 2018-12-22: qty 2

## 2018-12-22 MED ORDER — LACTATED RINGERS IV SOLN
INTRAVENOUS | Status: DC
  Administered 2018-12-22: 08:00:00 via INTRAVENOUS

## 2018-12-22 MED ORDER — KETOROLAC TROMETHAMINE 30 MG/ML IJ SOLN
30.0000 mg | Freq: Once | INTRAMUSCULAR | Status: AC
  Administered 2018-12-22: 30 mg via INTRAVENOUS
  Filled 2018-12-22: qty 1

## 2018-12-22 MED ORDER — FERRIC SUBSULFATE SOLN
Status: DC | PRN
  Administered 2018-12-22: 1

## 2018-12-22 MED ORDER — KETOROLAC TROMETHAMINE 10 MG PO TABS: 10.0000 mg | ORAL_TABLET | Freq: Three times a day (TID) | ORAL | 0 refills | Status: DC | PRN

## 2018-12-22 MED ORDER — PROPOFOL 10 MG/ML IV BOLUS
INTRAVENOUS | Status: AC
  Filled 2018-12-22: qty 20

## 2018-12-22 MED ORDER — MIDAZOLAM HCL 2 MG/2ML IJ SOLN: 0.5000 mg | Freq: Once | INTRAMUSCULAR | Status: DC | PRN

## 2018-12-22 MED ORDER — PHENYLEPHRINE HCL (PRESSORS) 10 MG/ML IV SOLN
INTRAVENOUS | Status: DC | PRN
  Administered 2018-12-22: 80 ug via INTRAVENOUS

## 2018-12-22 MED ORDER — ONDANSETRON 8 MG PO TBDP: 8.0000 mg | ORAL_TABLET | Freq: Three times a day (TID) | ORAL | 0 refills | Status: DC | PRN

## 2018-12-22 MED ORDER — PROPOFOL 10 MG/ML IV BOLUS
INTRAVENOUS | Status: DC | PRN
  Administered 2018-12-22: 180 mg via INTRAVENOUS

## 2018-12-22 MED ORDER — FENTANYL CITRATE (PF) 100 MCG/2ML IJ SOLN
INTRAMUSCULAR | Status: AC
  Filled 2018-12-22: qty 2

## 2018-12-22 MED ORDER — WATER FOR IRRIGATION, STERILE IR SOLN
Status: DC | PRN
  Administered 2018-12-22: 1000 mL

## 2018-12-22 MED ORDER — ONDANSETRON HCL 4 MG/2ML IJ SOLN
INTRAMUSCULAR | Status: AC
  Filled 2018-12-22: qty 2

## 2018-12-22 MED ORDER — LIDOCAINE 2% (20 MG/ML) 5 ML SYRINGE
INTRAMUSCULAR | Status: AC
  Filled 2018-12-22: qty 5

## 2018-12-22 MED ORDER — PROMETHAZINE HCL 25 MG/ML IJ SOLN: 6.2500 mg | INTRAMUSCULAR | Status: DC | PRN

## 2018-12-22 SURGICAL SUPPLY — 28 items
APL SWBSTK 6 STRL LF DISP (MISCELLANEOUS) ×1
APPLICATOR COTTON TIP 6 STRL (MISCELLANEOUS) ×1 IMPLANT
APPLICATOR COTTON TIP 6IN STRL (MISCELLANEOUS) ×3
CLOTH BEACON ORANGE TIMEOUT ST (SAFETY) ×3 IMPLANT
COVER LIGHT HANDLE STERIS (MISCELLANEOUS) ×3 IMPLANT
COVER WAND RF STERILE (DRAPES) ×2 IMPLANT
ELECT REM PT RETURN 9FT ADLT (ELECTROSURGICAL) ×3
ELECTRODE REM PT RTRN 9FT ADLT (ELECTROSURGICAL) ×1 IMPLANT
GLOVE BIOGEL PI IND STRL 7.0 (GLOVE) ×1 IMPLANT
GLOVE BIOGEL PI IND STRL 8 (GLOVE) ×1 IMPLANT
GLOVE BIOGEL PI INDICATOR 7.0 (GLOVE) ×2
GLOVE BIOGEL PI INDICATOR 8 (GLOVE) ×2
GLOVE SKINSENSE NS SZ8.0 LF (GLOVE) ×2
GLOVE SKINSENSE STRL SZ8.0 LF (GLOVE) IMPLANT
GLOVE SURG SS PI 7.0 STRL IVOR (GLOVE) ×2 IMPLANT
GOWN STRL REUS W/TWL LRG LVL3 (GOWN DISPOSABLE) ×3 IMPLANT
GOWN STRL REUS W/TWL XL LVL3 (GOWN DISPOSABLE) ×3 IMPLANT
KIT TURNOVER KIT A (KITS) ×3 IMPLANT
LASER FIBER DISP 1000U (UROLOGICAL SUPPLIES) ×3 IMPLANT
MANIFOLD NEPTUNE II (INSTRUMENTS) ×3 IMPLANT
PACK PERI GYN (CUSTOM PROCEDURE TRAY) ×3 IMPLANT
PAD ARMBOARD 7.5X6 YLW CONV (MISCELLANEOUS) ×3 IMPLANT
PREFILTER SMOKE EVAC (FILTER) ×3 IMPLANT
SCOPETTES 8  STERILE (MISCELLANEOUS) ×2
SCOPETTES 8 STERILE (MISCELLANEOUS) ×1 IMPLANT
SET BASIN LINEN APH (SET/KITS/TRAYS/PACK) ×3 IMPLANT
TUBING SMOKE EVAC CO2 (TUBING) ×3 IMPLANT
WATER STERILE IRR 1000ML POUR (IV SOLUTION) ×3 IMPLANT

## 2018-12-22 NOTE — Anesthesia Postprocedure Evaluation (Signed)
Anesthesia Post Note  Patient: Candace [name]  Procedure(s) Performed: LASER ABLATION OF CERVIX (N/A )  Patient location during evaluation: Phase II Anesthesia Type: General Level of consciousness: awake and alert, oriented and patient cooperative Pain management: pain level controlled Vital Signs Assessment: post-procedure vital signs reviewed and stable Respiratory status: spontaneous breathing Cardiovascular status: stable Postop Assessment: no apparent nausea or vomiting Anesthetic complications: no     Last Vitals:  Vitals:   12/22/18 0930 12/22/18 0945  BP: 101/75   Pulse: 90 73  Resp: (!) 21 18  Temp:    SpO2: 100% 98%    Last Pain:  Vitals:   12/22/18 0945  PainSc: 0-No pain                 ADAMS, AMY A

## 2018-12-22 NOTE — Progress Notes (Signed)
Patient ambulated to recliner with assistance.  Left dorsalis pedis pulse strong. Denies pain. Patient declined snack.

## 2018-12-22 NOTE — Op Note (Signed)
Preoperative Diagnosis:  High Grade Squamous Intraepithelial lesion, adequate colposcopy  Postoperative Diagnosis:  Same as above  Procedure:  Laser ablation of the cervix  Surgeon:  Florian Buff MD  Anaesthesia:  Laryngeal Mask Airway  Findings:  Patient had an abnormal pap smear which was evaluated in the office with colposcopy and directed biopsies.  Pathology report returned as high Grade SIL.  The colposcopy was adequate.  As a result, the patient is admitted for laser ablation of the cervix.  Description of Note:  Patient was taken to the OR and placed in the supine position where she underwent laryngeal mask airway anaesthesia.  She was placed in the dorsal lithotomy position.  She was draped for laser.  Graves speculum was placed and 3% acetic acid used and the laser microscope employed to perform colposcopy which confirmed the office findings.  Laser was used on typical cervical settings and used to vaporized the squamocolumnar junction to  depth of  5-7 mm peripherally and 7-9 mm centrally.  Surgical margin of several mm was employed beyond the acetowhite epithelium.  Hemostasis was achieved with the laser and Monsel's solution.  Patient was awakened from anaesthesia in good stable condition and all counts were correct.  She received Ancef 2 gram and Toradol 30 mg IV preoperatively prophylactically.  Florian Buff, MD 12/22/2018 9:09 AM

## 2018-12-22 NOTE — Progress Notes (Signed)
Patient able to raise bilateral legs sitting in chair equally, Left and right dorsal flexion of feet equal. Patient able to bend and extend bilateral knees equally. Left dorsalis pedis pulse and left popliteal pulse strong.

## 2018-12-22 NOTE — Progress Notes (Signed)
Patient c/o "numbness to left leg, just feels heavy".  Patient able to move all left metatarsals, positive flexion and extension of ankle, left popliteal pulse strong, leg warm to touch.  Dr. Elonda Husky notified via telephone. No further orders. Dr. Wyline Mood notified via telephone. Dr. Wyline Mood will come see patient in post operative area.

## 2018-12-22 NOTE — Progress Notes (Signed)
Dr. Wyline Mood anesthesiologist in to speak with patient.

## 2018-12-22 NOTE — Discharge Instructions (Signed)
Cervical Laser Surgery, Care After  This sheet gives you information about how to care for yourself after your procedure. Your health care provider may also give you more specific instructions. If you have problems or questions, contact your health care provider. What can I expect after the procedure? After the procedure, it is common to have:  Pain or discomfort.  Mild cramping.  Bleeding, spotting, or brownish discharge from your vagina. Follow these instructions at home: Activity  Return to your normal activities as told by your health care provider. Ask your health care provider what activities are safe for you.  Do not lift anything that is heavier than 10 lb (4.5 kg), or the limit that your health care provider tells you, until he or she says that it is safe.  Do not have sex or put anything in your vagina until your health care provider says it is okay. General instructions  Take over-the-counter and prescription medicines only as told by your health care provider.  Do not drive or use heavy machinery while taking prescription pain medicine.  Wear sanitary pads to protect from bleeding, spotting, and discharge.  Do not use tampons or douche until your health care provider says it is okay.  It is up to you to get the results of your procedure. Ask your health care provider, or the department that is doing the procedure, when your results will be ready.  Keep all follow-up visits as told by your health care provider. This is important. Contact a health care provider if:  Your pain or cramping does not improve.  Your periods are more painful than usual.  You do not get your period as expected. Get help right away if:  You have any symptoms of infection, such as: ? A fever. ? Chills. ? Discharge that smells bad.  You have severe pain in your abdomen.  You have heavy bleeding from your vagina (more than a normal period).  You have vaginal bleeding with clumps of  blood (blood clots). Summary  After this procedure, it is common to have pain or discomfort and mild cramping. It is also common to have bleeding, spotting, or brownish discharge from your vagina.  You may need to wear sanitary pads to protect from bleeding, spotting, and discharge.  Do not have sex, use tampons, or douche until your health care provider says it is okay.  Return to your normal activities as told by your health care provider. Ask your health care provider what activities are safe for you.  Take over-the-counter and prescription medicines only as told by your health care provider. These include medicines for pain. This information is not intended to replace advice given to you by your health care provider. Make sure you discuss any questions you have with your health care provider. Document Released: 03/17/2016 Document Revised: 03/17/2016 Document Reviewed: 03/17/2016 Elsevier Patient Education  2020 [employer] Anesthesia, Adult, Care After This sheet gives you information about how to care for yourself after your procedure. Your health care provider may also give you more specific instructions. If you have problems or questions, contact your health care provider. What can I expect after the procedure? After the procedure, the following side effects are common:  Pain or discomfort at the IV site.  Nausea.  Vomiting.  Sore throat.  Trouble concentrating.  Feeling cold or chills.  Weak or tired.  Sleepiness and fatigue.  Soreness and body aches. These side effects can  body that were not involved in surgery. °Follow these instructions at home: ° °For at least 24 hours after the procedure: °· Have a responsible adult stay with you. It is important to have someone help care for you until you are awake and alert. °· Rest as needed. °· Do not: °? Participate in activities in which you could fall or become injured. °? Drive. °? Use heavy  machinery. °? Drink alcohol. °? Take sleeping pills or medicines that cause drowsiness. °? Make important decisions or sign legal documents. °? Take care of children on your own. °Eating and drinking °· Follow any instructions from your health care provider about eating or drinking restrictions. °· When you feel hungry, start by eating small amounts of foods that are soft and easy to digest (bland), such as toast. Gradually return to your regular diet. °· Drink enough fluid to keep your urine pale yellow. °· If you vomit, rehydrate by drinking water, juice, or clear broth. °General instructions °· If you have sleep apnea, surgery and certain medicines can increase your risk for breathing problems. Follow instructions from your health care provider about wearing your sleep device: °? Anytime you are sleeping, including during daytime naps. °? While taking prescription pain medicines, sleeping medicines, or medicines that make you drowsy. °· Return to your normal activities as told by your health care provider. Ask your health care provider what activities are safe for you. °· Take over-the-counter and prescription medicines only as told by your health care provider. °· If you smoke, do not smoke without supervision. °· Keep all follow-up visits as told by your health care provider. This is important. °Contact a health care provider if: °· You have nausea or vomiting that does not get better with medicine. °· You cannot eat or drink without vomiting. °· You have pain that does not get better with medicine. °· You are unable to pass urine. °· You develop a skin rash. °· You have a fever. °· You have redness around your IV site that gets worse. °Get help right away if: °· You have difficulty breathing. °· You have chest pain. °· You have blood in your urine or stool, or you vomit blood. °Summary °· After the procedure, it is common to have a sore throat or nausea. It is also common to feel tired. °· Have a responsible  adult stay with you for the first 24 hours after general anesthesia. It is important to have someone help care for you until you are awake and alert. °· When you feel hungry, start by eating small amounts of foods that are soft and easy to digest (bland), such as toast. Gradually return to your regular diet. °· Drink enough fluid to keep your urine pale yellow. °· Return to your normal activities as told by your health care provider. Ask your health care provider what activities are safe for you. °This information is not intended to replace advice given to you by your health care provider. Make sure you discuss any questions you have with your health care provider. °Document Released: 08/04/2000 Document Revised: 05/01/2017 Document Reviewed: 12/12/2016 °Elsevier Patient Education © 2020 [employer]vier Inc. ° ° °

## 2018-12-22 NOTE — Transfer of Care (Signed)
Immediate Anesthesia Transfer of Care Note  Patient: Candace [name]  Procedure(s) Performed: LASER ABLATION (N/A )  Patient Location: PACU  Anesthesia Type:General  Level of Consciousness: awake, alert , oriented and patient cooperative  Airway & Oxygen Therapy: Patient Spontanous Breathing and Patient connected to face mask oxygen  Post-op Assessment: Report given to RN and Post -op Vital signs reviewed and stable  Post vital signs: Reviewed and stable  Last Vitals:  Vitals Value Taken Time  BP 104/81 12/22/18 0917  Temp    Pulse 89 12/22/18 0920  Resp 18 12/22/18 0920  SpO2 100 % 12/22/18 0920  Vitals shown include unvalidated device data.  Last Pain:  Vitals:   12/22/18 0750  PainSc: 0-No pain         Complications: No apparent anesthesia complications

## 2018-12-22 NOTE — Anesthesia Preprocedure Evaluation (Signed)
Anesthesia Evaluation  Patient identified by MRN, date of birth, ID band Patient awake    Reviewed: Allergy & Precautions, NPO status , Patient's Chart, lab work & pertinent test results  Airway Mallampati: II  TM Distance: >3 FB Neck ROM: Full    Dental no notable dental hx. (+) Teeth Intact   Pulmonary neg pulmonary ROS,    Pulmonary exam normal breath sounds clear to auscultation       Cardiovascular Exercise Tolerance: Good negative cardio ROS Normal cardiovascular examI Rhythm:Regular Rate:Normal  ST on ECG ,short PR -reports can walk a mile  Denies CP/DOE   Neuro/Psych  Headaches, negative psych ROS   GI/Hepatic negative GI ROS, Neg liver ROS, States has a small stable liver lesion that is being watched -no current treatment    Endo/Other  negative endocrine ROSdiabetes  Renal/GU negative Renal ROS  negative genitourinary   Musculoskeletal negative musculoskeletal ROS (+)   Abdominal   Peds negative pediatric ROS (+)  Hematology negative hematology ROS (+) anemia ,   Anesthesia Other Findings   Reproductive/Obstetrics negative OB ROS                             Anesthesia Physical Anesthesia Plan  ASA: II  Anesthesia Plan: General   Post-op Pain Management:    Induction: Intravenous  PONV Risk Score and Plan: 3 and Dexamethasone, Ondansetron and Treatment may vary due to age or medical condition  Airway Management Planned: LMA and Oral ETT  Additional Equipment:   Intra-op Plan:   Post-operative Plan: Extubation in OR  Informed Consent: I have reviewed the patients History and Physical, chart, labs and discussed the procedure including the risks, benefits and alternatives for the proposed anesthesia with the patient or authorized representative who has indicated his/her understanding and acceptance.     Dental advisory given  Plan Discussed with:  CRNA  Anesthesia Plan Comments: (Plan Full PPE use  Plan GA (LMA) vs GETA -d/w pt -VU -WTP with same )        Anesthesia Quick Evaluation

## 2018-12-22 NOTE — H&P (Signed)
Preoperative History and Physical  Candace [name] is a 33 y.o. [id number] with No LMP recorded. (Menstrual status: IUD). 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  Declines vaginal hysterectomy for management  PMH:    Past Medical History:  Diagnosis Date  . Allergy   . Anemia   . BV (bacterial vaginosis) 12/15/2012  . Diabetes ([place])    borderline DM per patient - denies DM  . Eczema   . Headache(784.0)   . History of chlamydia   . History of gonorrhea   . Irregular heartbeat   . Vaginal itching 12/15/2012  . Vaginal Pap smear, abnormal    f/u ok    PSH:     Past Surgical History:  Procedure Laterality Date  . DILATION AND CURETTAGE OF UTERUS N/A 11/22/2012   Procedure: SUCTION DILATATION AND CURETTAGE;  Surgeon: Florian Buff, MD;  Location: [facility];  Service: Gynecology;  Laterality: N/A;    POb/GynH:      OB History    Gravida  3   Para  2   Term  2   Preterm      AB  1   Living  2     SAB  1   TAB      Ectopic      Multiple      Live Births  2           SH:   Social History   Tobacco Use  . Smoking status: Never Smoker  . Smokeless tobacco: Never Used  Substance Use Topics  . Alcohol use: Yes    Comment: 1 shot per month  . Drug use: No    FH:    Family History  Problem Relation Age of Onset  . Prostate cancer Paternal Grandfather   . Breast cancer Paternal Grandmother   . Lung cancer Paternal Grandmother   . Lung cancer Maternal Grandfather   . Colon polyps Father   . Prostate cancer Father   . Healthy Brother   . Healthy Daughter   . Healthy Son   . Breast cancer Maternal Aunt        Multiple with Breast Cancer  . Cervical cancer Maternal Aunt   . Stroke Maternal Aunt   . Hypertension Other   . Diabetes Other   . Other Other        stomach tumors  . Heart disease Other        her mom, gma and aunts have arrythmia  . Kidney disease Paternal Aunt   . Other  Paternal Aunt        colon problems  . Stroke Paternal Uncle        PGreatUncle  . Prostate cancer Paternal Uncle      Allergies:  Allergies  Allergen Reactions  . Vicodin [Hydrocodone-Acetaminophen] Other (See Comments)    violent  . Percocet [Oxycodone-Acetaminophen]     Violent behaviors  . Latex Itching and Rash    Medications:       Current Facility-Administered Medications:  .  ceFAZolin (ANCEF) IVPB 2g/100 mL premix, 2 g, Intravenous, On Call to OR, Florian Buff, MD .  ketorolac (TORADOL) 30 MG/ML injection 30 mg, 30 mg, Intravenous, Once, Florian Buff, MD  Review of Systems:   Review of Systems  Constitutional: Negative for fever, chills, weight loss, malaise/fatigue and diaphoresis.  HENT: Negative for hearing loss, ear pain, nosebleeds, congestion, sore throat,  neck pain, tinnitus and ear discharge.   Eyes: Negative for blurred vision, double vision, photophobia, pain, discharge and redness.  Respiratory: Negative for cough, hemoptysis, sputum production, shortness of breath, wheezing and stridor.   Cardiovascular: Negative for chest pain, palpitations, orthopnea, claudication, leg swelling and PND.  Gastrointestinal: Positive for abdominal pain. Negative for heartburn, nausea, vomiting, diarrhea, constipation, blood in stool and melena.  Genitourinary: Negative for dysuria, urgency, frequency, hematuria and flank pain.  Musculoskeletal: Negative for myalgias, back pain, joint pain and falls.  Skin: Negative for itching and rash.  Neurological: Negative for dizziness, tingling, tremors, sensory change, speech change, focal weakness, seizures, loss of consciousness, weakness and headaches.  Endo/Heme/Allergies: Negative for environmental allergies and polydipsia. Does not bruise/bleed easily.  Psychiatric/Behavioral: Negative for depression, suicidal ideas, hallucinations, memory loss and substance abuse. The patient is not nervous/anxious and does not have insomnia.       PHYSICAL EXAM:  Blood pressure 118/84, pulse 79, resp. rate 20, SpO2 100 %.    Vitals reviewed. Constitutional: She is oriented to person, place, and time. She appears well-developed and well-nourished.  HENT:  Head: Normocephalic and atraumatic.  Right Ear: External ear normal.  Left Ear: External ear normal.  Nose: Nose normal.  Mouth/Throat: Oropharynx is clear and moist.  Eyes: Conjunctivae and EOM are normal. Pupils are equal, round, and reactive to light. Right eye exhibits no discharge. Left eye exhibits no discharge. No scleral icterus.  Neck: Normal range of motion. Neck supple. No tracheal deviation present. No thyromegaly present.  Cardiovascular: Normal rate, regular rhythm, normal heart sounds and intact distal pulses.  Exam reveals no gallop and no friction rub.   No murmur heard. Respiratory: Effort normal and breath sounds normal. No respiratory distress. She has no wheezes. She has no rales. She exhibits no tenderness.  GI: Soft. Bowel sounds are normal. She exhibits no distension and no mass. There is tenderness. There is no rebound and no guarding.  Genitourinary:       Vulva is normal without lesions Vagina is pink moist without discharge Cervix normal in appearance see colpo note Uterus is normal size, contour, position, consistency, mobility, non-tender Adnexa is negative with normal sized ovaries by sonogram  Musculoskeletal: Normal range of motion. She exhibits no edema and no tenderness.  Neurological: She is alert and oriented to person, place, and time. She has normal reflexes. She displays normal reflexes. No cranial nerve deficit. She exhibits normal muscle tone. Coordination normal.  Skin: Skin is warm and dry. No rash noted. No erythema. No pallor.  Psychiatric: She has a normal mood and affect. Her behavior is normal. Judgment and thought content normal.    Labs: Results for orders placed or performed during the hospital encounter of 12/20/18  (from the past 336 hour(s))  CBC   Collection Time: 12/20/18  3:00 PM  Result Value Ref Range   WBC 6.1 4.0 - 10.5 K/uL   RBC 4.67 3.87 - 5.11 MIL/uL   Hemoglobin 13.3 12.0 - 15.0 g/dL   HCT 41.342.1 24.436.0 - 01.046.0 %   MCV 90.1 80.0 - 100.0 fL   MCH 28.5 26.0 - 34.0 pg   MCHC 31.6 30.0 - 36.0 g/dL   RDW 27.213.5 53.611.5 - 64.415.5 %   Platelets 262 150 - 400 K/uL   nRBC 0.0 0.0 - 0.2 %  Comprehensive metabolic panel   Collection Time: 12/20/18  3:00 PM  Result Value Ref Range   Sodium 138 135 - 145 mmol/L   Potassium  3.9 3.5 - 5.1 mmol/L   Chloride 105 98 - 111 mmol/L   CO2 25 22 - 32 mmol/L   Glucose, Bld 106 (H) 70 - 99 mg/dL   BUN 13 6 - 20 mg/dL   Creatinine, Ser 1.610.83 0.44 - 1.00 mg/dL   Calcium 9.1 8.9 - 09.610.3 mg/dL   Total Protein 7.3 6.5 - 8.1 g/dL   Albumin 4.2 3.5 - 5.0 g/dL   AST 27 15 - 41 U/L   ALT 43 0 - 44 U/L   Alkaline Phosphatase 61 38 - 126 U/L   Total Bilirubin 0.9 0.3 - 1.2 mg/dL   GFR calc non Af Amer >60 >60 mL/min   GFR calc Af Amer >60 >60 mL/min   Anion gap 8 5 - 15  hCG, quantitative, pregnancy   Collection Time: 12/20/18  3:00 PM  Result Value Ref Range   hCG, Beta Chain, Quant, S <1 <5 mIU/mL  Rapid HIV screen (HIV 1/2 Ab+Ag)   Collection Time: 12/20/18  3:00 PM  Result Value Ref Range   HIV-1 P24 Antigen - HIV24 NON REACTIVE NON REACTIVE   HIV 1/2 Antibodies NON REACTIVE NON REACTIVE   Interpretation (HIV Ag Ab)      A non reactive test result means that HIV 1 or HIV 2 antibodies and HIV 1 p24 antigen were not detected in the specimen.  Urinalysis, Routine w reflex microscopic   Collection Time: 12/20/18  3:00 PM  Result Value Ref Range   Color, Urine YELLOW YELLOW   APPearance HAZY (A) CLEAR   Specific Gravity, Urine 1.019 1.005 - 1.030   pH 5.0 5.0 - 8.0   Glucose, UA NEGATIVE NEGATIVE mg/dL   Hgb urine dipstick NEGATIVE NEGATIVE   Bilirubin Urine NEGATIVE NEGATIVE   Ketones, ur NEGATIVE NEGATIVE mg/dL   Protein, ur NEGATIVE NEGATIVE mg/dL    Nitrite NEGATIVE NEGATIVE   Leukocytes,Ua NEGATIVE NEGATIVE  Results for orders placed or performed during the hospital encounter of 12/20/18 (from the past 336 hour(s))  SARS CORONAVIRUS 2 Nasal Swab Aptima Multi Swab   Collection Time: 12/20/18  4:55 PM   Specimen: Aptima Multi Swab; Nasal Swab  Result Value Ref Range   SARS Coronavirus 2 NEGATIVE NEGATIVE    EKG: Orders placed or performed during the hospital encounter of 12/20/18  . EKG 12-Lead  . EKG 12-Lead    Imaging Studies: No results found.    Assessment: HSIL , large anterior cervical lesion, adequate colposcopy Declines vaginal hysterectomy as treatment option   Patient Active Problem List   Diagnosis Date Noted  . Diabetes ([facility])   . Atypical squamous cells of undetermined significance on cytologic smear of cervix (ASC-US) 11/17/2013  . 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 Hyperlipidemia 10/17/2011  . Iron deficiency anemia 09/10/2011  . Eczema 09/10/2011  . Seasonal allergies 09/10/2011  . Obesity 09/10/2011    Plan: Laser conization of the cervix  Lazaro ArmsLuther H Eure 12/22/2018 8:12 AM

## 2018-12-22 NOTE — Anesthesia Procedure Notes (Signed)
Procedure Name: LMA Insertion Date/Time: 12/22/2018 8:38 AM Performed by: Adams, Amy A, CRNA Pre-anesthesia Checklist: Patient identified, Emergency Drugs available, Suction available, Patient being monitored and Timeout performed Patient Re-evaluated:Patient Re-evaluated prior to induction Oxygen Delivery Method: Circle system utilized Preoxygenation: Pre-oxygenation with 100% oxygen Induction Type: IV induction LMA: LMA inserted LMA Size: 4.0 Number of attempts: 1 Placement Confirmation: positive ETCO2 and breath sounds checked- equal and bilateral Tube secured with: Tape Dental Injury: Teeth and Oropharynx as per pre-operative assessment

## 2018-12-23 ENCOUNTER — Encounter (HOSPITAL_COMMUNITY): Payer: Self-pay | Admitting: Obstetrics & Gynecology

## 2018-12-23 NOTE — Addendum Note (Signed)
Addendum  created 12/23/18 1054 by Mickel Baas, CRNA   Charge Capture section accepted

## 2018-12-27 ENCOUNTER — Other Ambulatory Visit: Payer: Self-pay

## 2018-12-27 ENCOUNTER — Ambulatory Visit (INDEPENDENT_AMBULATORY_CARE_PROVIDER_SITE_OTHER): Payer: No Typology Code available for payment source | Admitting: Obstetrics & Gynecology

## 2018-12-27 ENCOUNTER — Encounter: Payer: Self-pay | Admitting: Obstetrics & Gynecology

## 2018-12-27 VITALS — BP 109/78 | HR 91 | Ht 62.25 in | Wt 217.0 lb

## 2018-12-27 DIAGNOSIS — Z9889 Other specified postprocedural states: Secondary | ICD-10-CM

## 2018-12-27 NOTE — Progress Notes (Signed)
  HPI: Patient returns for routine postoperative follow-up having undergone laser ablation of the cervix for HSIL on 12/22/2018.  The patient's immediate postoperative recovery has been unremarkable. Since hospital discharge the patient reports no problems.   Current Outpatient Medications: levonorgestrel (MIRENA) 20 MCG/24HR IUD, 1 each by Intrauterine route once., Disp: , Rfl:   No current facility-administered medications for this visit.     Blood pressure 109/78, pulse 91, height 5' 2.25" (1.581 m), weight 217 lb (98.4 kg).  Physical Exam: Cervical cone bed is hemostatic and healing well Carbonized tissue flakes present  Diagnostic Tests:   Pathology: HSIL  Impression: S/p Laser ablation of the cervix for HSL  Plan: Needs IUD changed out, schedule with Kn[facility] Neu in 5 weeks Needs first follow up Pap in 6 months  Follow up: 5  weeks  Florian Buff, MD

## 2019-01-28 ENCOUNTER — Encounter: Payer: Self-pay | Admitting: Women's Health

## 2019-01-28 ENCOUNTER — Other Ambulatory Visit: Payer: Self-pay

## 2019-01-28 ENCOUNTER — Ambulatory Visit (INDEPENDENT_AMBULATORY_CARE_PROVIDER_SITE_OTHER): Payer: No Typology Code available for payment source | Admitting: Women's Health

## 2019-01-28 VITALS — BP 107/74 | HR 92 | Ht 62.25 in | Wt 213.5 lb

## 2019-01-28 DIAGNOSIS — Z30432 Encounter for removal of intrauterine contraceptive device: Secondary | ICD-10-CM

## 2019-01-28 DIAGNOSIS — Z30430 Encounter for insertion of intrauterine contraceptive device: Secondary | ICD-10-CM

## 2019-01-28 LAB — POCT URINE PREGNANCY: Preg Test, Ur: NEGATIVE

## 2019-01-28 MED ORDER — LEVONORGESTREL 20 MCG/24HR IU IUD
INTRAUTERINE_SYSTEM | Freq: Once | INTRAUTERINE | Status: AC
  Administered 2019-01-28: 11:00:00 via INTRAUTERINE

## 2019-01-28 NOTE — Patient Instructions (Signed)
 Nothing in vagina for 3 days (no sex, douching, tampons, etc...)  Check your strings once a month to make sure you can feel them, if you are not able to please let us know  If you develop a fever of 100.4 or more in the next few weeks, or if you develop severe abdominal pain, please let us know  Use a backup method of birth control, such as condoms, for 2 weeks    Intrauterine Device Insertion, Care After  This sheet gives you information about how to care for yourself after your procedure. Your health care provider may also give you more specific instructions. If you have problems or questions, contact your health care provider. What can I expect after the procedure? After the procedure, it is common to have:  Cramps and pain in the abdomen.  Light bleeding (spotting) or heavier bleeding that is like your menstrual period. This may last for up to a few days.  Lower back pain.  Dizziness.  Headaches.  Nausea. Follow these instructions at home:  Before resuming sexual activity, check to make sure that you can feel the IUD string(s). You should be able to feel the end of the string(s) below the opening of your cervix. If your IUD string is in place, you may resume sexual activity. ? If you had a hormonal IUD inserted more than 7 days after your most recent period started, you will need to use a backup method of birth control for 7 days after IUD insertion. Ask your health care provider whether this applies to you.  Continue to check that the IUD is still in place by feeling for the string(s) after every menstrual period, or once a month.  Take over-the-counter and prescription medicines only as told by your health care provider.  Do not drive or use heavy machinery while taking prescription pain medicine.  Keep all follow-up visits as told by your health care provider. This is important. Contact a health care provider if:  You have bleeding that is heavier or lasts longer than  a normal menstrual cycle.  You have a fever.  You have cramps or abdominal pain that get worse or do not get better with medicine.  You develop abdominal pain that is new or is not in the same area of earlier cramping and pain.  You feel lightheaded or weak.  You have abnormal or bad-smelling discharge from your vagina.  You have pain during sexual activity.  You have any of the following problems with your IUD string(s): ? The string bothers or hurts you or your sexual partner. ? You cannot feel the string. ? The string has gotten longer.  You can feel the IUD in your vagina.  You think you may be pregnant, or you miss your menstrual period.  You think you may have an STI (sexually transmitted infection). Get help right away if:  You have flu-like symptoms.  You have a fever and chills.  You can feel that your IUD has slipped out of place. Summary  After the procedure, it is common to have cramps and pain in the abdomen. It is also common to have light bleeding (spotting) or heavier bleeding that is like your menstrual period.  Continue to check that the IUD is still in place by feeling for the string(s) after every menstrual period, or once a month.  Keep all follow-up visits as told by your health care provider. This is important.  Contact your health care provider if   you have problems with your IUD string(s), such as the string getting longer or bothering you or your sexual partner. This information is not intended to replace advice given to you by your health care provider. Make sure you discuss any questions you have with your health care provider. Document Released: 12/25/2010 Document Revised: 04/10/2017 Document Reviewed: 03/19/2016 Elsevier Patient Education  2020 [employer]  

## 2019-01-28 NOTE — Addendum Note (Signed)
Addended by: Linton Rump on: 01/28/2019 11:17 AM   Modules accepted: Orders

## 2019-01-28 NOTE — Progress Notes (Signed)
   IUD REMOVAL & RE-INSERTION Patient name: Candace [name] MRN [id number]  Date of birth: 1985/11/05 Subjective Findings:   @Candace  A [name] is a 33 y.o. G79P2012 African American female being seen today for removal of a Mirena  IUD and insertion of a Mirena  IUD. Her IUD was placed 09/27/13.   No LMP recorded. (Menstrual status: IUD). Last pap 10/22/18. Results were:  LSIL w/ abnormal colpo, s/p laser ablation cx 12/22/18  The risks and benefits of the method and placement have been thouroughly reviewed with the patient and all questions were answered.  Specifically the patient is aware of failure rate of 05/998, expulsion of the IUD and of possible perforation.  The patient is aware of irregular bleeding due to the method and understands the incidence of irregular bleeding diminishes with time.  Signed copy of informed consent in chart.  Pertinent History Reviewed:   Reviewed past medical,surgical, social, obstetrical and family history.  Reviewed problem list, medications and allergies. Objective Findings & Procedure:    Vitals:   01/28/19 0946  BP: 107/74  Pulse: 92  Weight: 213 lb 8 oz (96.8 kg)  Height: 5' 2.25" (1.581 m)  Body mass index is 38.74 kg/m.  Results for orders placed or performed in visit on 11/08/18 (from the past 24 hour(s))  POCT urine pregnancy   Collection Time: 01/28/19  9:48 AM  Result Value Ref Range   Preg Test, Ur Negative Negative     Time out was performed.  A graves speculum was placed in the vagina.  The cervix was visualized, prepped using Betadine. The strings were NOT visible.  They were grasped intrauterine by curved forceps and the Mirena IUD was easily removed. The cervix was then grasped with a single-tooth tenaculum. The uterus was found to be neutral and it sounded to 8 cm.  Mirena  IUD placed per manufacturer's recommendations without complications. The strings were trimmed to approximately 3 cm.  The patient tolerated the procedure well.    Informal transvaginal sonogram was performed and the proper placement of the IUD was verified.  Assessment & Plan:   1) Mirena IUD removal & Mirena insertion The patient was given post procedure instructions, including signs and symptoms of infection and to check for the strings after each menses or each month, and refraining from intercourse or anything in the vagina for 3 days. She was given a care card with date IUD placed, and date IUD to be removed. She is scheduled for a f/u appointment in 4 weeks.  2) H/O abnormal pap w/ laser ablation cx> needs repeat pap in Dec  No orders of the defined types were placed in this encounter.   Follow-up: Return in about 4 weeks (around 02/25/2019) for F/U, MyChart Video, then after 12/12 for pap.  [place], [facility] 01/28/2019 10:34 AM

## 2019-02-21 ENCOUNTER — Encounter: Payer: Self-pay | Admitting: *Deleted

## 2019-02-24 ENCOUNTER — Telehealth (INDEPENDENT_AMBULATORY_CARE_PROVIDER_SITE_OTHER): Payer: No Typology Code available for payment source | Admitting: Women's Health

## 2019-02-24 ENCOUNTER — Encounter: Payer: Self-pay | Admitting: Women's Health

## 2019-02-24 DIAGNOSIS — Z30431 Encounter for routine checking of intrauterine contraceptive device: Secondary | ICD-10-CM

## 2019-02-24 NOTE — Progress Notes (Signed)
   [facility] VIRTUAL GYN VISIT ENCOUNTER NOTE Patient name: Candace [name] MRN [id number]  Date of birth: [birth year]  I connected with patient on 02/24/19 at  2:50 PM EDT by MyChart video  and verified that I am speaking with the correct person using two identifiers.  Due to COVID-19 recommendations, pt is not currently in the office.    I discussed the limitations, risks, security and privacy concerns of performing an evaluation and management service by telephone and the availability of in person appointments. I also discussed with the patient that there may be a patient responsible charge related to this service. The patient expressed understanding and agreed to proceed.   Chief Complaint:   iud check  History of Present Illness:   Candace [name] is a 33 y.o. G80P2012 African American female being evaluated today for IUD f/u. Had Mirena removal and reinsertion 01/28/19. No problems, no pain, no bleeding, able to feel strings.   No LMP recorded. (Menstrual status: IUD). The current method of family planning is IUD.  Last pap 10/22/18. Results were:  LSIL w/ abnormal colpo, s/p laser ablation Review of Systems:   Pertinent items are noted in HPI Denies fever/chills, dizziness, headaches, visual disturbances, fatigue, shortness of breath, chest pain, abdominal pain, vomiting, abnormal vaginal discharge/itching/odor/irritation, problems with periods, bowel movements, urination, or intercourse unless otherwise stated above.  Pertinent History Reviewed:  Reviewed past medical,surgical, social, obstetrical and family history.  Reviewed problem list, medications and allergies. Physical Assessment:  There were no vitals filed for this visit.There is no height or weight on file to calculate BMI.       Physical Examination:   General:  Alert, oriented and cooperative.   Mental Status: Normal mood and affect perceived. Normal judgment and thought content.  Physical exam deferred due to nature of  the encounter  No results found for this or any previous visit (from the past 24 hour(s)).  Assessment & Plan:  1) IUD f/u> doing well  2) H/O abnormal pap w/ laser ablation> already scheduled for 61mth repeat pap  Meds: No orders of the defined types were placed in this encounter.   No orders of the defined types were placed in this encounter.   I discussed the assessment and treatment plan with the patient. The patient was provided an opportunity to ask questions and all were answered. The patient agreed with the plan and demonstrated an understanding of the instructions.   The patient was advised to call back or seek an in-person evaluation/go to the ED if the symptoms worsen or if the condition fails to improve as anticipated.  I provided 10 minutes of non-face-to-face time during this encounter.   Return for As scheduled 12/14 for , Pap & physical.  Candace [name] CNM, [facility] 02/24/2019 3:15 PM

## 2019-02-25 ENCOUNTER — Telehealth: Payer: No Typology Code available for payment source | Admitting: Women's Health

## 2019-04-25 ENCOUNTER — Other Ambulatory Visit: Payer: No Typology Code available for payment source | Admitting: Women's Health

## 2019-05-16 ENCOUNTER — Other Ambulatory Visit: Payer: No Typology Code available for payment source | Admitting: Women's Health

## 2019-05-17 ENCOUNTER — Ambulatory Visit: Payer: No Typology Code available for payment source | Admitting: Family Medicine

## 2019-05-18 ENCOUNTER — Ambulatory Visit (INDEPENDENT_AMBULATORY_CARE_PROVIDER_SITE_OTHER): Payer: No Typology Code available for payment source | Admitting: Family Medicine

## 2019-05-18 ENCOUNTER — Encounter: Payer: Self-pay | Admitting: Family Medicine

## 2019-05-18 ENCOUNTER — Other Ambulatory Visit: Payer: Self-pay

## 2019-05-18 VITALS — BP 110/62 | HR 62 | Temp 98.8°F | Resp 14 | Ht 62.25 in | Wt 212.0 lb

## 2019-05-18 DIAGNOSIS — N6315 Unspecified lump in the right breast, overlapping quadrants: Secondary | ICD-10-CM

## 2019-05-18 DIAGNOSIS — Z803 Family history of malignant neoplasm of breast: Secondary | ICD-10-CM

## 2019-05-18 MED ORDER — DOXYCYCLINE HYCLATE 100 MG PO TABS: 100.0000 mg | ORAL_TABLET | Freq: Two times a day (BID) | ORAL | 0 refills | Status: DC

## 2019-05-18 NOTE — Progress Notes (Signed)
   Subjective:    Patient ID: Candace [name], female    DOB: 1985/09/22, 34 y.o.   MRN: [id number]  Patient presents for Lump to R Breast (x1 month- lump in base of breast with pain- no drainage from area or nipple- family hx breast cancer)  Patient here with concern for right breast lump.  States that she has a hard knot which is tender on the inner part of her right breast.  This is been present for the past month.  She does have family history of breast cancer in aunts as well as cousins.  States that it was larger but has not changed in size over past few weeks.  She has not had any drainage but it is quite tender.  She has not noted any lumps in her armpits or the other breast.  He has not had any discharge from the nipple.  She is not on any estrogen Review Of Systems:  GEN- denies fatigue, fever, weight loss,weakness, recent illness HEENT- denies eye drainage, change in vision, nasal discharge, CVS- denies chest pain, palpitations RESP- denies SOB, cough, wheeze ABD- denies N/V, change in stools, abd pain GU- denies dysuria, hematuria, dribbling, incontinence MSK- denies joint pain, muscle aches, injury Neuro- denies headache, dizziness, syncope, seizure activity       Objective:    BP 110/62   Pulse 62   Temp 98.8 F (37.1 C) (Temporal)   Resp 14   Ht 5' 2.25" (1.581 m)   Wt 212 lb (96.2 kg)   SpO2 98%   BMI 38.46 kg/m  GEN- NAD, alert and oriented x3 CVS- RRR, no murmur RESP-CTAB Breast- normal symmetry, no nipple inversion,no nipple drainage, Right breast nodule around the 3 o'clock position near the junction of the sternum-2.5cm x1cm tender to palpation no  fluctuance no erythema  no axillary nodes         Assessment & Plan:      Problem List Items Addressed This Visit    None    Visit Diagnoses    Breast lump on right side at 3 o'clock position    -  Primary   Based on appearance and palpation this may be infected cyst but there is a possibility of  underlying breast cancer if it does run in her family.  We will get diagnostic mammogram and ultrasound of the right breast.  We will go ahead and start her on doxycycline antibiotics to cover for infection.  Use warm compresses.  I will hold off an opening this up today.  If found to be a cyst based on imaging does not improve with antibiotics I will refer her to a breast surgeon.   Relevant Orders   MM Digital Diagnostic Unilat R   US BREAST COMPLETE UNI RIGHT INC AXILLA   Family history of breast cancer       Relevant Orders   MM Digital Diagnostic Unilat R   US BREAST COMPLETE UNI RIGHT INC AXILLA      Note: This dictation was prepared with Dragon dictation along with smaller phrase technology. Any transcriptional errors that result from this process are unintentional.

## 2019-05-18 NOTE — Patient Instructions (Signed)
F/u pending results 

## 2019-05-27 ENCOUNTER — Other Ambulatory Visit: Payer: No Typology Code available for payment source

## 2019-06-02 ENCOUNTER — Ambulatory Visit
Admission: RE | Admit: 2019-06-02 | Discharge: 2019-06-02 | Disposition: A | Discharge status: Home / Self Care | Payer: No Typology Code available for payment source | Source: Ambulatory Visit | Attending: Family Medicine | Admitting: Family Medicine

## 2019-06-02 ENCOUNTER — Other Ambulatory Visit: Payer: Self-pay

## 2019-06-02 ENCOUNTER — Other Ambulatory Visit: Payer: No Typology Code available for payment source | Admitting: Women's Health

## 2019-06-02 DIAGNOSIS — Z803 Family history of malignant neoplasm of breast: Secondary | ICD-10-CM

## 2019-06-02 DIAGNOSIS — N6315 Unspecified lump in the right breast, overlapping quadrants: Secondary | ICD-10-CM

## 2019-06-13 ENCOUNTER — Encounter: Payer: Self-pay | Admitting: Adult Health

## 2019-06-13 ENCOUNTER — Ambulatory Visit (INDEPENDENT_AMBULATORY_CARE_PROVIDER_SITE_OTHER): Payer: No Typology Code available for payment source | Admitting: Adult Health

## 2019-06-13 ENCOUNTER — Other Ambulatory Visit (HOSPITAL_COMMUNITY)
Admission: RE | Admit: 2019-06-13 | Discharge: 2019-06-13 | Disposition: A | Discharge status: Home / Self Care | Payer: No Typology Code available for payment source | Source: Ambulatory Visit | Attending: Adult Health | Admitting: Adult Health

## 2019-06-13 ENCOUNTER — Other Ambulatory Visit: Payer: Self-pay

## 2019-06-13 VITALS — BP 123/83 | HR 93 | Ht 62.25 in | Wt 212.4 lb

## 2019-06-13 DIAGNOSIS — Z8742 Personal history of other diseases of the female genital tract: Secondary | ICD-10-CM

## 2019-06-13 DIAGNOSIS — Z975 Presence of (intrauterine) contraceptive device: Secondary | ICD-10-CM

## 2019-06-13 DIAGNOSIS — Z01419 Encounter for gynecological examination (general) (routine) without abnormal findings: Secondary | ICD-10-CM | POA: Insufficient documentation

## 2019-06-13 NOTE — Progress Notes (Signed)
  Subjective:     Patient ID: Candace [name], female   DOB: 1985/09/17, 34 y.o.   MRN: [id number]  HPI Candace [name] is a 34 year old black female, single, [id number] in for first pap after Laser for HSIL on 12/22/18. She has no complaints. She has IUD, mirena that was placed 01/28/19. Physical is with PCP  PCP is Dr Jeanice Lim.   Review of Systems  Patient denies any headaches, hearing loss, fatigue, blurred vision, shortness of breath, chest pain, abdominal pain, problems with bowel movements, urination, or intercourse. No joint pain or mood swings.  Reviewed past medical,surgical, social and family history. Reviewed medications and allergies.     Objective:   Physical Exam BP 123/83 (BP Location: Left Arm, Patient Position: Sitting, Cuff Size: Normal)   Pulse 93   Ht 5' 2.25" (1.581 m)   Wt 212 lb 6.4 oz (96.3 kg)   BMI 38.54 kg/m   Skin warm and dry.Pelvic: external genitalia is normal in appearance no lesions, vagina: is pink with good moisture and rugae,urethra has no lesions or masses noted, cervix:smooth and bulbous, IUD strings at os, pap with high risk HPV 16/18 genotyping performed.  , uterus: normal size, shape and contour, non tender, no masses felt, adnexa: no masses or tenderness noted. Bladder is non tender and no masses felt.  Examination chaperoned by Stoney Bang LPN.     Assessment:     1. Encounter for gynecological examination with Papanicolaou smear of cervix Pap sent Follow up pap in 6 months Dr Jeanice Lim to do physical   2. History of abnormal cervical Pap smear First pap since Laser sent   3. IUD (intrauterine device) in place     Plan:     Pap in 6 months Physical with Dr Jeanice Lim

## 2019-06-14 LAB — CYTOLOGY - PAP
Adequacy: ABSENT
Comment: NEGATIVE
Diagnosis: NEGATIVE
High risk HPV: NEGATIVE

## 2019-08-25 ENCOUNTER — Encounter: Payer: Self-pay | Admitting: Family Medicine

## 2019-08-25 DIAGNOSIS — R6881 Early satiety: Secondary | ICD-10-CM

## 2019-08-25 DIAGNOSIS — R112 Nausea with vomiting, unspecified: Secondary | ICD-10-CM

## 2019-08-26 MED ORDER — SUCRALFATE 1 G PO TABS: 1.0000 g | ORAL_TABLET | Freq: Three times a day (TID) | ORAL | 2 refills | Status: DC

## 2019-08-26 MED ORDER — ONDANSETRON 4 MG PO TBDP: 4.0000 mg | ORAL_TABLET | Freq: Three times a day (TID) | ORAL | 0 refills | Status: DC | PRN

## 2019-08-26 NOTE — Telephone Encounter (Signed)
She saw GI- Dr. Myrtie Neither in the past Referral back to GI  I have sent Zofran to pharmacy Will also send Carafate to coat her stomach for now , take with meals, this should help reduce the nausea as well

## 2019-08-31 ENCOUNTER — Encounter: Payer: Self-pay | Admitting: Physician Assistant

## 2019-09-15 ENCOUNTER — Encounter: Payer: Self-pay | Admitting: Physician Assistant

## 2019-09-15 ENCOUNTER — Ambulatory Visit (INDEPENDENT_AMBULATORY_CARE_PROVIDER_SITE_OTHER): Payer: No Typology Code available for payment source | Admitting: Physician Assistant

## 2019-09-15 VITALS — BP 118/78 | HR 83 | Temp 98.0°F | Ht 62.75 in | Wt 210.0 lb

## 2019-09-15 DIAGNOSIS — R112 Nausea with vomiting, unspecified: Secondary | ICD-10-CM | POA: Diagnosis not present

## 2019-09-15 NOTE — Progress Notes (Signed)
Chief Complaint: Nausea and vomiting  HPI:    Candace [name] is a 34 year old African-American female with past medical history as listed below, known to Dr. Myrtie Neither, who presents clinic today with a complaint of nausea and vomiting.     01/04/2016 EGD was completely normal.    02/19/2016 seen by Dr. Myrtie Neither for nausea without vomiting.  That time she was following up.  Zofran 4 mg twice a day controlled her symptoms such that she was no longer vomiting.  Previous gallbladder testing was normal, upper endoscopy and for gastric emptying study were normal.  She still had intermittent nausea controlled with Zofran.  It was noted that cause of symptoms is unclear.  It did not appear to be GERD or anything obstructive and her gallbladder was normal with no gastritis.  Thought possibly she had mild gastric dysrhythmia and/or some other functional cause.  Recommend she continue as needed Zofran.    Today, the patient tells me that her symptoms had eased off and she was only vomiting occasionally, but now over the past month and a half everything that she puts in her mouth will come back out within a few minutes or within an hour and if she does not vomit then she is nauseous all day.  Tells me that Zofran does help but this makes her constipated and "I am not trying to get constipated".  Tells me she called her PCP when the symptoms started back again and they prescribed her Sucralfate which she has been taking 3 times a day but this is not helping at all.      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    Denies any heartburn, reflux or abdominal pain.  Past Medical History:  Diagnosis Date  . Allergy   . Anemia   . BV (bacterial vaginosis) 12/15/2012  . Diabetes (HCC)    borderline DM per patient - denies DM  . Eczema   . Headache(784.0)   . History of chlamydia   . History of gonorrhea   . Irregular heartbeat   . Vaginal itching 12/15/2012  . Vaginal Pap smear, abnormal    f/u ok    Past Surgical History:    Procedure Laterality Date  . CERVICAL CONIZATION W/BX N/A 12/22/2018   Procedure: LASER ABLATION OF CERVIX;  Surgeon: Lazaro Arms, MD;  Location: [facility];  Service: Gynecology;  Laterality: N/A;  . DILATION AND CURETTAGE OF UTERUS N/A 11/22/2012   Procedure: SUCTION DILATATION AND CURETTAGE;  Surgeon: Lazaro Arms, MD;  Location: [facility];  Service: Gynecology;  Laterality: N/A;    Current Outpatient Medications  Medication Sig Dispense Refill  . levonorgestrel (MIRENA) 20 MCG/24HR IUD 1 each by Intrauterine route once.    . ondansetron (ZOFRAN ODT) 4 MG disintegrating tablet Take 1 tablet (4 mg total) by mouth every 8 (eight) hours as needed for nausea or vomiting. 20 tablet 0  . sucralfate (CARAFATE) 1 g tablet Take 1 tablet (1 g total) by mouth 3 (three) times daily with meals. 90 tablet 2   No current facility-administered medications for this visit.    Allergies as of 09/15/2019 - Review Complete 09/15/2019  Allergen Reaction Noted  . Vicodin [hydrocodone-acetaminophen] Other (See Comments) 10/19/2012  . Percocet [oxycodone-acetaminophen]  11/18/2018  . Latex Itching and Rash 09/23/2012    Family History  Problem Relation Age of Onset  . Prostate cancer Paternal Grandfather   . Breast cancer Paternal Grandmother   . Lung  cancer Paternal Grandmother   . Lung cancer Maternal Grandfather   . Colon polyps Father   . Prostate cancer Father   . Healthy Brother   . Healthy Daughter   . Healthy Son   . Breast cancer Maternal Aunt        Multiple with Breast Cancer  . Cervical cancer Maternal Aunt   . Stroke Maternal Aunt   . Hypertension Other   . Diabetes Other   . Other Other        stomach tumors  . Heart disease Other        her mom, gma and aunts have arrythmia  . Kidney disease Paternal Aunt   . Colon cancer Paternal Aunt        colon problems  . Stroke Paternal Uncle        PGreatUncle  . Prostate cancer Paternal Uncle   . Breast cancer Cousin     Social  History   Socioeconomic History  . Marital status: Single    Spouse name: Not on file  . Number of children: 2  . Years of education: Not on file  . Highest education level: Some college, no degree  Occupational History  . Occupation: CNA    Comment: [employer]  Tobacco Use  . Smoking status: Never Smoker  . Smokeless tobacco: Never Used  Substance and Sexual Activity  . Alcohol use: Not Currently    Comment: socially   . Drug use: No  . Sexual activity: Yes    Birth control/protection: I.U.D.  Other Topics Concern  . Not on file  Social History Narrative  . Not on file   Social Determinants of Health   Financial Resource Strain:   . Difficulty of Paying Living Expenses:   Food Insecurity:   . Worried About Charity fundraiser in the Last Year:   . Arboriculturist in the Last Year:   Transportation Needs:   . Film/video editor (Medical):   Marland Kitchen Lack of Transportation (Non-Medical):   Physical Activity:   . Days of Exercise per Week:   . Minutes of Exercise per Session:   Stress:   . Feeling of Stress :   Social Connections:   . Frequency of Communication with Friends and Family:   . Frequency of Social Gatherings with Friends and Family:   . Attends Religious Services:   . Active Member of Clubs or Organizations:   . Attends Archivist Meetings:   Marland Kitchen Marital Status:   Intimate Partner Violence:   . Fear of Current or Ex-Partner:   . Emotionally Abused:   Marland Kitchen Physically Abused:   . Sexually Abused:     Review of Systems:    Constitutional: No weight loss, fever or chills Skin: No rash  Cardiovascular: No chest pain   Respiratory: No SOB  Gastrointestinal: See HPI and otherwise negative Genitourinary: No dysuria  Neurological: No headache, dizziness or syncope Musculoskeletal: No new muscle or joint pain Hematologic: No bleeding  Psychiatric: No history of depression or anxiety   Physical Exam:  Vital signs: BP 118/78   Pulse 83    Temp 98 F (36.7 C)   Ht 5' 2.75" (1.594 m)   Wt 210 lb (95.3 kg)   BMI 37.50 kg/m   Constitutional:   Pleasant overweight AA female appears to be in NAD, Well developed, Well nourished, alert and cooperative Head:  Normocephalic and atraumatic. Eyes:   PEERL, EOMI. No icterus. Conjunctiva pink.  Ears:  Normal auditory acuity. Neck:  Supple Throat: Oral cavity and pharynx without inflammation, swelling or lesion.  Respiratory: Respirations even and unlabored. Lungs clear to auscultation bilaterally.   No wheezes, crackles, or rhonchi.  Cardiovascular: Normal S1, S2. No MRG. Regular rate and rhythm. No peripheral edema, cyanosis or pallor.  Gastrointestinal:  Soft, nondistended, nontender. No rebound or guarding. Normal bowel sounds. No appreciable masses or hepatomegaly. Rectal:  Not performed.  Msk:  Symmetrical without gross deformities. Without edema, no deformity or joint abnormality.  Neurologic:  Alert and  oriented x4;  grossly normal neurologically.  Skin:   Dry and intact without significant lesions or rashes. Psychiatric:  Demonstrates good judgement and reason without abnormal affect or behaviors.  MOST RECENT LABS AND IMAGING: CBC    Component Value Date/Time   WBC 6.1 12/20/2018 1500   RBC 4.67 12/20/2018 1500   HGB 13.3 12/20/2018 1500   HCT 42.1 12/20/2018 1500   PLT 262 12/20/2018 1500   MCV 90.1 12/20/2018 1500   MCH 28.5 12/20/2018 1500   MCHC 31.6 12/20/2018 1500   RDW 13.5 12/20/2018 1500   LYMPHSABS 1,827 10/22/2018 1140   MONOABS 459 10/12/2015 1009   EOSABS 99 10/22/2018 1140   BASOSABS 41 10/22/2018 1140    CMP     Component Value Date/Time   NA 138 12/20/2018 1500   K 3.9 12/20/2018 1500   CL 105 12/20/2018 1500   CO2 25 12/20/2018 1500   GLUCOSE 106 (H) 12/20/2018 1500   BUN 13 12/20/2018 1500   CREATININE 0.83 12/20/2018 1500   CREATININE 0.87 10/22/2018 1140   CALCIUM 9.1 12/20/2018 1500   PROT 7.3 12/20/2018 1500   ALBUMIN 4.2  12/20/2018 1500   AST 27 12/20/2018 1500   ALT 43 12/20/2018 1500   ALKPHOS 61 12/20/2018 1500   BILITOT 0.9 12/20/2018 1500   GFRNONAA >60 12/20/2018 1500   GFRAA >60 12/20/2018 1500    Assessment: 1.  Nausea and vomiting: Full work-up in 2017 including normal EGD, normal gastric emptying study and normal gallbladder ultrasound, etiology unknown at that time but improved with Zofran, now 3 years later symptoms have worsened; consider gastritis now versus slowed gastric emptying versus functional  Plan: 1.  Scheduled patient for repeat EGD today with Dr. Myrtie Neither.  It has been 3 years since her last one and something may have changed.  Did discuss risks, benefits, limitations and alternatives and patient agrees to proceed. Patient will need to be COVID tested 2 days prior to procedure. 2.  Discontinued Carafate as this is not helping 3.  Recommend the patient restart her Zofran twice daily scheduled. She has this medicine at home. 4.  Discussed that if the Zofran makes her constipated she can start MiraLAX once daily. 5.  Patient to follow in clinic per recommendations from Dr. Myrtie Neither after time of procedure.  Hyacinth Meeker, PA-C [facility] 09/15/2019, 9:31 AM  Cc: Salley Scarlet, MD

## 2019-09-15 NOTE — Patient Instructions (Addendum)
If you are age 35 or older, your body mass index should be between 23-30. Your Body mass index is 37.5 kg/m. If this is out of the aforementioned range listed, please consider follow up with your Primary Care Provider.  If you are age 108 or younger, your body mass index should be between 19-25. Your Body mass index is 37.5 kg/m. If this is out of the aformentioned range listed, please consider follow up with your Primary Care Provider.   Start Zofran twice daily.  Start Miralax daily.  Stop Carafate.

## 2019-09-16 NOTE — Progress Notes (Signed)
____________________________________________________________  Attending physician addendum:  Thank you for sending this case to me. I have reviewed the entire note, and the outlined plan seems appropriate.  If EGD unrevealing, I will most likely get repeat gastric emptying study.  Amada Jupiter, MD  ____________________________________________________________

## 2019-10-03 ENCOUNTER — Ambulatory Visit (INDEPENDENT_AMBULATORY_CARE_PROVIDER_SITE_OTHER): Payer: No Typology Code available for payment source

## 2019-10-03 ENCOUNTER — Other Ambulatory Visit: Payer: Self-pay

## 2019-10-03 ENCOUNTER — Other Ambulatory Visit: Payer: Self-pay | Admitting: Gastroenterology

## 2019-10-03 DIAGNOSIS — Z1159 Encounter for screening for other viral diseases: Secondary | ICD-10-CM

## 2019-10-03 LAB — SARS CORONAVIRUS 2 (TAT 6-24 HRS): SARS Coronavirus 2: NEGATIVE

## 2019-10-05 ENCOUNTER — Ambulatory Visit (AMBULATORY_SURGERY_CENTER): Payer: No Typology Code available for payment source | Admitting: Gastroenterology

## 2019-10-05 ENCOUNTER — Encounter: Payer: Self-pay | Admitting: Gastroenterology

## 2019-10-05 ENCOUNTER — Other Ambulatory Visit: Payer: Self-pay | Admitting: Gastroenterology

## 2019-10-05 ENCOUNTER — Other Ambulatory Visit: Payer: Self-pay

## 2019-10-05 VITALS — BP 120/91 | HR 61 | Temp 97.1°F | Resp 17 | Ht 62.75 in | Wt 210.0 lb

## 2019-10-05 DIAGNOSIS — R112 Nausea with vomiting, unspecified: Secondary | ICD-10-CM | POA: Diagnosis present

## 2019-10-05 MED ORDER — SODIUM CHLORIDE 0.9 % IV SOLN: 500.0000 mL | Freq: Once | INTRAVENOUS | Status: DC

## 2019-10-05 MED ORDER — ONDANSETRON 4 MG PO TBDP: 4.0000 mg | ORAL_TABLET | Freq: Three times a day (TID) | ORAL | 2 refills | Status: DC | PRN

## 2019-10-05 NOTE — Op Note (Signed)
[facility] Patient Name: Candace [name] Procedure Date: 10/05/2019 10:37 AM MRN: [id number] Endoscopist: Sherilyn Cooter L. Myrtie Neither , MD Age: 34 Referring MD:  Date of Birth: 01/16/1986 Gender: Female Account #: [id number] Procedure:                Upper GI endoscopy Indications:              Nausea with vomiting (nml EGD and GES 2017). nausea                            may occur with/without vomiting. vomiting often                            occurs within minutes after eating. symptoms                            generally controlled with zofran. no heartburn, and                            is not on acid suppression medicine Medicines:                Monitored Anesthesia Care Procedure:                Pre-Anesthesia Assessment:                           - Prior to the procedure, a History and Physical                            was performed, and patient medications and                            allergies were reviewed. The patient's tolerance of                            previous anesthesia was also reviewed. The risks                            and benefits of the procedure and the sedation                            options and risks were discussed with the patient.                            All questions were answered, and informed consent                            was obtained. Prior Anticoagulants: The patient has                            taken no previous anticoagulant or antiplatelet                            agents. ASA Grade Assessment: I - A normal, healthy  patient. After reviewing the risks and benefits,                            the patient was deemed in satisfactory condition to                            undergo the procedure.                           After obtaining informed consent, the endoscope was                            passed under direct vision. Throughout the                            procedure, the patient's blood  pressure, pulse, and                            oxygen saturations were monitored continuously. The                            Endoscope was introduced through the mouth, and                            advanced to the second part of duodenum. The upper                            GI endoscopy was accomplished without difficulty.                            The patient tolerated the procedure well. Scope In: Scope Out: Findings:                 The esophagus was normal.                           The stomach was normal.                           The cardia and gastric fundus were normal on                            retroflexion. (Hill Grade 2)                           The examined duodenum was normal. Complications:            No immediate complications. Estimated Blood Loss:     Estimated blood loss: none. Impression:               - Normal esophagus.                           - Normal stomach.                           - Normal examined duodenum.                           -  No specimens collected.                           This has appeared to be idiopathic nausea and                            vomiting, but may also be atypical GERD symptoms. Recommendation:           - Patient has a contact number available for                            emergencies. The signs and symptoms of potential                            delayed complications were discussed with the                            patient. Return to normal activities tomorrow.                            Written discharge instructions were provided to the                            patient.                           - Resume previous diet.                           - Continue present medications.                           - Perform a barium swallow (upright and supine with                            water siphon test) at appointment to be scheduled. Lisl Slingerland L. Myrtie Neither, MD 10/05/2019 11:00:39 AM This report has been signed  electronically.

## 2019-10-05 NOTE — Patient Instructions (Signed)
Thank you for allowing [place] to care for you today!   We will be calling to schedule a barium swallow with you.  Resume previous diet and medications today.  Return to your normal activities tomorrow.   YOU HAD AN ENDOSCOPIC PROCEDURE TODAY AT THE [facility]:   Refer to the procedure report that was given to you for any specific questions about what was found during the examination.  If the procedure report does not answer your questions, please call your gastroenterologist to clarify.  If you requested that your care partner not be given the details of your procedure findings, then the procedure report has been included in a sealed envelope for you to review at your convenience later.  YOU SHOULD EXPECT: Some feelings of bloating in the abdomen. Passage of more gas than usual.  Walking can help get rid of the air that was put into your GI tract during the procedure and reduce the bloating. If you had a lower endoscopy (such as a colonoscopy or flexible sigmoidoscopy) you may notice spotting of blood in your stool or on the toilet paper. If you underwent a bowel prep for your procedure, you may not have a normal bowel movement for a few days.  Please Note:  You might notice some irritation and congestion in your nose or some drainage.  This is from the oxygen used during your procedure.  There is no need for concern and it should clear up in a day or so.  SYMPTOMS TO REPORT IMMEDIATELY:     Following upper endoscopy (EGD)  Vomiting of blood or coffee ground material  New chest pain or pain under the shoulder blades  Painful or persistently difficult swallowing  New shortness of breath  Fever of 100F or higher  Black, tarry-looking stools  For urgent or emergent issues, a gastroenterologist can be reached at any hour by calling [phone]. Do not use MyChart messaging for urgent concerns.    DIET:  We do recommend a small meal at first, but then you may proceed to your  regular diet.  Drink plenty of fluids but you should avoid alcoholic beverages for 24 hours.  ACTIVITY:  You should plan to take it easy for the rest of today and you should NOT DRIVE or use heavy machinery until tomorrow (because of the sedation medicines used during the test).    FOLLOW UP: Our staff will call the number listed on your records 48-72 hours following your procedure to check on you and address any questions or concerns that you may have regarding the information given to you following your procedure. If we do not reach you, we will leave a message.  We will attempt to reach you two times.  During this call, we will ask if you have developed any symptoms of COVID 19. If you develop any symptoms (ie: fever, flu-like symptoms, shortness of breath, cough etc.) before then, please call [phone].  If you test positive for Covid 19 in the 2 weeks post procedure, please call and report this information to [place].    If any biopsies were taken you will be contacted by phone or by letter within the next 1-3 weeks.  Please call us at [phone] if you have not heard about the biopsies in 3 weeks.    SIGNATURES/CONFIDENTIALITY: You and/or your care partner have signed paperwork which will be entered into your electronic medical record.  These signatures attest to the fact that that the information above  on your After Visit Summary has been reviewed and is understood.  Full responsibility of the confidentiality of this discharge information lies with you and/or your care-partner.

## 2019-10-05 NOTE — Progress Notes (Signed)
1050 Nasal tube 7.0  Placed without trauma, vss   Report given to PACU, vss

## 2019-10-05 NOTE — Progress Notes (Signed)
Please see egd report and order barium swallow

## 2019-10-05 NOTE — Progress Notes (Signed)
Spoke with patient regarding Barium swallow study that has been scheduled for 10/14/2019 at 11 am, NPO 3 hours prior. Pt advised to call central scheduling if this day or time does not work so that she can be rescheduled accordingly. Pt advised to call with any questions or concerns.

## 2019-10-05 NOTE — Progress Notes (Signed)
Temp JB V/S CW 

## 2019-10-07 ENCOUNTER — Telehealth: Payer: Self-pay

## 2019-10-07 ENCOUNTER — Telehealth: Payer: Self-pay | Admitting: Physician Assistant

## 2019-10-07 NOTE — Telephone Encounter (Signed)
Pt requested to reschedule her barium swallow scheduled 10/14/19.  She stated that she has to work on that day.

## 2019-10-07 NOTE — Telephone Encounter (Signed)
Left message on follow up call. 

## 2019-10-07 NOTE — Telephone Encounter (Signed)
Left detailed message for patient to contact central scheduling at [phone] to get barium swallow study rescheduled due to her work schedule. Patient advised to call office back if she has any further questions.

## 2019-10-07 NOTE — Telephone Encounter (Signed)
First attempt follow up call to pt, lm on vm 

## 2019-10-14 ENCOUNTER — Other Ambulatory Visit: Payer: Self-pay

## 2019-10-14 ENCOUNTER — Emergency Department (HOSPITAL_BASED_OUTPATIENT_CLINIC_OR_DEPARTMENT_OTHER)
Admit: 2019-10-14 | Discharge: 2019-10-14 | Disposition: A | Discharge status: Home / Self Care | Payer: No Typology Code available for payment source | Attending: Emergency Medicine | Admitting: Emergency Medicine

## 2019-10-14 ENCOUNTER — Other Ambulatory Visit: Payer: Self-pay | Admitting: Cardiology

## 2019-10-14 ENCOUNTER — Emergency Department (HOSPITAL_COMMUNITY): Payer: No Typology Code available for payment source

## 2019-10-14 ENCOUNTER — Emergency Department (HOSPITAL_COMMUNITY)
Admission: EM | Admit: 2019-10-14 | Discharge: 2019-10-14 | Disposition: A | Discharge status: Home / Self Care | Payer: No Typology Code available for payment source | Attending: Emergency Medicine | Admitting: Emergency Medicine

## 2019-10-14 ENCOUNTER — Other Ambulatory Visit (HOSPITAL_COMMUNITY): Payer: No Typology Code available for payment source

## 2019-10-14 ENCOUNTER — Encounter (HOSPITAL_COMMUNITY): Payer: Self-pay | Admitting: Emergency Medicine

## 2019-10-14 DIAGNOSIS — Z91040 Latex allergy status: Secondary | ICD-10-CM | POA: Insufficient documentation

## 2019-10-14 DIAGNOSIS — R42 Dizziness and giddiness: Secondary | ICD-10-CM | POA: Diagnosis not present

## 2019-10-14 DIAGNOSIS — R072 Precordial pain: Secondary | ICD-10-CM | POA: Diagnosis not present

## 2019-10-14 DIAGNOSIS — R112 Nausea with vomiting, unspecified: Secondary | ICD-10-CM | POA: Insufficient documentation

## 2019-10-14 DIAGNOSIS — M7989 Other specified soft tissue disorders: Secondary | ICD-10-CM | POA: Diagnosis not present

## 2019-10-14 DIAGNOSIS — R7989 Other specified abnormal findings of blood chemistry: Secondary | ICD-10-CM | POA: Diagnosis not present

## 2019-10-14 DIAGNOSIS — R9431 Abnormal electrocardiogram [ECG] [EKG]: Secondary | ICD-10-CM

## 2019-10-14 DIAGNOSIS — R0789 Other chest pain: Secondary | ICD-10-CM | POA: Diagnosis not present

## 2019-10-14 DIAGNOSIS — R000 Tachycardia, unspecified: Secondary | ICD-10-CM | POA: Insufficient documentation

## 2019-10-14 DIAGNOSIS — Z79899 Other long term (current) drug therapy: Secondary | ICD-10-CM | POA: Diagnosis not present

## 2019-10-14 DIAGNOSIS — R0602 Shortness of breath: Secondary | ICD-10-CM | POA: Diagnosis not present

## 2019-10-14 DIAGNOSIS — R002 Palpitations: Secondary | ICD-10-CM | POA: Diagnosis not present

## 2019-10-14 DIAGNOSIS — R079 Chest pain, unspecified: Secondary | ICD-10-CM

## 2019-10-14 LAB — PROTIME-INR
INR: 1.2 (ref 0.8–1.2)
INR: 1.1 (ref 0.8–1.2)
Prothrombin Time: 15.1 seconds (ref 11.4–15.2)
Prothrombin Time: 14.2 seconds (ref 11.4–15.2)

## 2019-10-14 LAB — BASIC METABOLIC PANEL
Anion gap: 11 (ref 5–15)
BUN: 14 mg/dL (ref 6–20)
CO2: 21 mmol/L — ABNORMAL LOW (ref 22–32)
Calcium: 8.7 mg/dL — ABNORMAL LOW (ref 8.9–10.3)
Chloride: 105 mmol/L (ref 98–111)
Creatinine, Ser: 0.94 mg/dL (ref 0.44–1.00)
GFR calc Af Amer: >60 mL/min (ref >60)
GFR calc non Af Amer: >60 mL/min (ref >60)
Glucose, Bld: 127 mg/dL — ABNORMAL HIGH (ref 70–99)
Potassium: 4 mmol/L (ref 3.5–5.1)
Sodium: 137 mmol/L (ref 135–145)

## 2019-10-14 LAB — HEPATIC FUNCTION PANEL
ALT: 27 U/L (ref 0–44)
AST: 21 U/L (ref 15–41)
Albumin: 3.9 g/dL (ref 3.5–5.0)
Alkaline Phosphatase: 62 U/L (ref 38–126)
Bilirubin, Direct: 0.2 mg/dL (ref 0.0–0.2)
Indirect Bilirubin: 0.9 mg/dL (ref 0.3–0.9)
Total Bilirubin: 1.1 mg/dL (ref 0.3–1.2)
Total Protein: 7.1 g/dL (ref 6.5–8.1)

## 2019-10-14 LAB — BRAIN NATRIURETIC PEPTIDE: B Natriuretic Peptide: 24.4 pg/mL (ref 0.0–100.0)

## 2019-10-14 LAB — CBC
HCT: 42.3 % (ref 36.0–46.0)
Hemoglobin: 13.5 g/dL (ref 12.0–15.0)
MCH: 28.4 pg (ref 26.0–34.0)
MCHC: 31.9 g/dL (ref 30.0–36.0)
MCV: 89.1 fL (ref 80.0–100.0)
Platelets: 263 10*3/uL (ref 150–400)
RBC: 4.75 MIL/uL (ref 3.87–5.11)
RDW: 13.8 % (ref 11.5–15.5)
WBC: 6.4 10*3/uL (ref 4.0–10.5)
nRBC: 0 % (ref 0.0–0.2)

## 2019-10-14 LAB — RAPID URINE DRUG SCREEN, HOSP PERFORMED
Amphetamines: NOT DETECTED
Barbiturates: NOT DETECTED
Benzodiazepines: NOT DETECTED
Cocaine: NOT DETECTED
Opiates: NOT DETECTED
Tetrahydrocannabinol: NOT DETECTED

## 2019-10-14 LAB — MAGNESIUM: Magnesium: 1.9 mg/dL (ref 1.7–2.4)

## 2019-10-14 LAB — TROPONIN I (HIGH SENSITIVITY)
Troponin I (High Sensitivity): <2 ng/L (ref <18)
Troponin I (High Sensitivity): <2 ng/L (ref <18)

## 2019-10-14 LAB — I-STAT BETA HCG BLOOD, ED (MC, WL, AP ONLY): I-stat hCG, quantitative: <5 m[IU]/mL (ref <5)

## 2019-10-14 LAB — D-DIMER, QUANTITATIVE: D-Dimer, Quant: 0.91 ug/mL-FEU — ABNORMAL HIGH (ref 0.00–0.50)

## 2019-10-14 LAB — LIPASE, BLOOD: Lipase: 27 U/L (ref 11–51)

## 2019-10-14 MED ORDER — SODIUM CHLORIDE 0.9% FLUSH: 3.0000 mL | Freq: Once | INTRAVENOUS | Status: DC

## 2019-10-14 MED ORDER — SODIUM CHLORIDE 0.9 % IV BOLUS
500.0000 mL | Freq: Once | INTRAVENOUS | Status: AC
  Administered 2019-10-14: 500 mL via INTRAVENOUS

## 2019-10-14 MED ORDER — LORAZEPAM 0.5 MG PO TABS
1.0000 mg | ORAL_TABLET | Freq: Three times a day (TID) | ORAL | 0 refills | Status: DC | PRN
Start: 2019-10-14 — End: 2019-11-30

## 2019-10-14 MED ORDER — IOHEXOL 350 MG/ML SOLN
100.0000 mL | Freq: Once | INTRAVENOUS | Status: AC | PRN
  Administered 2019-10-14: 100 mL via INTRAVENOUS

## 2019-10-14 NOTE — ED Notes (Signed)
IV team at bedside 

## 2019-10-14 NOTE — ED Notes (Signed)
Cards at bedside

## 2019-10-14 NOTE — ED Triage Notes (Signed)
Patient reports central chest pain with mild SOB and emesis this morning , no cough, fever or diaphoresis .

## 2019-10-14 NOTE — ED Provider Notes (Signed)
[facility] EMERGENCY DEPARTMENT Provider Note   CSN: [id number] Arrival date & time: 10/14/19  0341     History Chief Complaint  Patient presents with   Chest Pain    Candace [name] is a 34 y.o. female.  The history is provided by the patient and medical records. No language interpreter was used.  Chest Pain Pain location:  Substernal area and R chest Pain quality: aching and pressure   Pain radiates to:  Does not radiate Pain severity:  Moderate Onset quality:  Sudden Timing:  Constant Progression:  Waxing and waning Chronicity:  New Relieved by:  Nothing Worsened by:  Nothing Ineffective treatments:  None tried Associated symptoms: fatigue, lower extremity edema, nausea, near-syncope, palpitations, shortness of breath and vomiting   Associated symptoms: no abdominal pain, no altered mental status, no back pain, no cough, no diaphoresis, no dizziness, no fever, no headache and no weakness   Risk factors: not female   Palpitations Palpitations quality:  Fast Onset quality:  Sudden Timing:  Sporadic Progression:  Resolved Chronicity:  New Relieved by:  Nothing Associated symptoms: chest pain, lower extremity edema, nausea, near-syncope, shortness of breath and vomiting   Associated symptoms: no back pain, no cough, no diaphoresis, no dizziness and no weakness        Past Medical History:  Diagnosis Date   Allergy    Anemia    BV (bacterial vaginosis) 12/15/2012   Diabetes (HCC)    borderline DM per patient - denies DM   Eczema    Headache(784.0)    History of chlamydia    History of gonorrhea    Irregular heartbeat    Vaginal itching 12/15/2012   Vaginal Pap smear, abnormal    f/u ok    Patient Active Problem List   Diagnosis Date Noted   History of abnormal cervical Pap smear 06/13/2019   Encounter for gynecological examination with Papanicolaou smear of cervix 06/13/2019   IUD (intrauterine device) in place 06/13/2019    Encounter for insertion of mirena IUD 01/28/2019   Diabetes (HCC)    Abnormal Pap smear of cervix 11/01/2013   Hyperlipidemia 10/17/2011   Iron deficiency anemia 09/10/2011   Eczema 09/10/2011   Seasonal allergies 09/10/2011   Obesity 09/10/2011    Past Surgical History:  Procedure Laterality Date   CERVICAL CONIZATION W/BX N/A 12/22/2018   Procedure: LASER ABLATION OF CERVIX;  Surgeon: Lazaro Arms, MD;  Location: [facility];  Service: Gynecology;  Laterality: N/A;   DILATION AND CURETTAGE OF UTERUS N/A 11/22/2012   Procedure: SUCTION DILATATION AND CURETTAGE;  Surgeon: Lazaro Arms, MD;  Location: [facility];  Service: Gynecology;  Laterality: N/A;     OB History    Gravida  3   Para  2   Term  2   Preterm      AB  1   Living  2     SAB  1   TAB      Ectopic      Multiple      Live Births  2           Family History  Problem Relation Age of Onset   Prostate cancer Paternal Grandfather    Breast cancer Paternal Grandmother    Lung cancer Paternal Grandmother    Lung cancer Maternal Grandfather    Colon polyps Father    Prostate cancer Father    Healthy Brother    Healthy Daughter    Healthy  Son    Breast cancer Maternal Aunt        Multiple with Breast Cancer   Cervical cancer Maternal Aunt    Stroke Maternal Aunt    Hypertension Other    Diabetes Other    Other Other        stomach tumors   Heart disease Other        her mom, gma and aunts have arrythmia   Kidney disease Paternal Aunt    Colon cancer Paternal Aunt        colon problems   Stroke Paternal Uncle        PGreatUncle   Prostate cancer Paternal Uncle    Breast cancer Cousin    Esophageal cancer Neg Hx    Rectal cancer Neg Hx    Stomach cancer Neg Hx     Social History   Tobacco Use   Smoking status: Never Smoker   Smokeless tobacco: Never Used  Substance Use Topics   Alcohol use: Not Currently    Comment: socially    Drug use: No     Home Medications Prior to Admission medications   Medication Sig Start Date End Date Taking? Authorizing Provider  levonorgestrel (MIRENA) 20 MCG/24HR IUD 1 each by Intrauterine route once.    [provider]  ondansetron (ZOFRAN ODT) 4 MG disintegrating tablet Take 1 tablet (4 mg total) by mouth every 8 (eight) hours as needed for nausea or vomiting. 10/05/19   Danis, Andreas Blower, MD  sucralfate (CARAFATE) 1 g tablet Take 1 tablet (1 g total) by mouth 3 (three) times daily with meals. 08/26/19   Salley Scarlet, MD    Allergies    Vicodin [hydrocodone-acetaminophen], Percocet [oxycodone-acetaminophen], and Latex  Review of Systems   Review of Systems  Constitutional: Positive for fatigue. Negative for chills, diaphoresis and fever.  HENT: Negative for congestion and rhinorrhea.   Eyes: Negative for visual disturbance.  Respiratory: Positive for shortness of breath. Negative for cough, chest tightness, wheezing and stridor.   Cardiovascular: Positive for chest pain, palpitations, leg swelling and near-syncope.  Gastrointestinal: Positive for nausea and vomiting. Negative for abdominal pain, constipation and diarrhea.  Genitourinary: Negative for dysuria, flank pain and frequency.  Musculoskeletal: Negative for back pain, neck pain and neck stiffness.  Neurological: Positive for light-headedness. Negative for dizziness, syncope, weakness and headaches.  Psychiatric/Behavioral: Negative for agitation and confusion.  All other systems reviewed and are negative.   Physical Exam Updated Vital Signs BP 112/70 (BP Location: Right Arm)    Pulse (!) 120    Temp 99.6 F (37.6 C) (Oral)    Resp 20    Ht 5\' 2"  (1.575 m)    Wt 110 kg    SpO2 98%    BMI 44.35 kg/m   Physical Exam Vitals and nursing note reviewed.  Constitutional:      General: She is not in acute distress.    Appearance: She is well-developed. She is not ill-appearing, toxic-appearing or diaphoretic.  HENT:      Head: Normocephalic and atraumatic.  Eyes:     Conjunctiva/sclera: Conjunctivae normal.     Pupils: Pupils are equal, round, and reactive to light.  Cardiovascular:     Rate and Rhythm: Regular rhythm. Tachycardia present.     Heart sounds: Murmur present.  Pulmonary:     Effort: Pulmonary effort is normal. No tachypnea, accessory muscle usage or respiratory distress.     Breath sounds: Normal breath sounds. No stridor. No  decreased breath sounds, wheezing, rhonchi or rales.  Chest:     Chest wall: No tenderness.  Abdominal:     Palpations: Abdomen is soft.     Tenderness: There is no abdominal tenderness.  Musculoskeletal:     Cervical back: Normal range of motion and neck supple.     Right lower leg: No tenderness. No edema.     Left lower leg: No tenderness. No edema.  Skin:    General: Skin is warm and dry.     Capillary Refill: Capillary refill takes less than 2 seconds.     Coloration: Skin is not pale.     Findings: No erythema.  Neurological:     General: No focal deficit present.     Mental Status: She is alert.  Psychiatric:        Mood and Affect: Mood normal.     ED Results / Procedures / Treatments   Labs (all labs ordered are listed, but only abnormal results are displayed) Labs Reviewed  BASIC METABOLIC PANEL - Abnormal; Notable for the following components:      Result Value   CO2 21 (*)    Glucose, Bld 127 (*)    Calcium 8.7 (*)    All other components within normal limits  CBC  PROTIME-INR  RAPID URINE DRUG SCREEN, HOSP PERFORMED  MAGNESIUM  HEPATIC FUNCTION PANEL  LIPASE, BLOOD  BRAIN NATRIURETIC PEPTIDE  D-DIMER, QUANTITATIVE (NOT AT [facility])  I-STAT BETA HCG BLOOD, ED ([facility], [facility], [facility] ONLY)  TROPONIN I (HIGH SENSITIVITY)  TROPONIN I (HIGH SENSITIVITY)    EKG EKG Interpretation  Date/Time:  Friday October 14 2019 03:45:42 EDT Ventricular Rate:  120 PR Interval:    QRS Duration: 212 QT Interval:  426 QTC Calculation: 602 R Axis:   49 Text  Interpretation: Wide QRS rhythm Non-specific intra-ventricular conduction block Left ventricular hypertrophy with repolarization abnormality ( Cornell product ) Abnormal ECG When comapred to prior, faster rate. No STEMI Confirmed by Theda Belfast ([id number]) on 10/14/2019 7:06:45 AM   Radiology DG Chest 2 View  Result Date: 10/14/2019 CLINICAL DATA:  Chest pain EXAM: CHEST - 2 VIEW COMPARISON:  None. FINDINGS: The heart size and mediastinal contours are within normal limits. Both lungs are clear. The visualized skeletal structures are unremarkable. IMPRESSION: No active cardiopulmonary disease. Electronically Signed   By: Jonna Clark M.D.   On: 10/14/2019 04:36   VAS [place] LOWER EXTREMITY VENOUS (DVT) (ONLY [facility] & [facility])  Result Date: 10/14/2019  Lower Venous DVTStudy Indications: Swelling.  Risk Factors: None identified. Comparison Study: No prior studies. Performing Technologist: Chanda Busing RVT  Examination Guidelines: A complete evaluation includes B-mode imaging, spectral Doppler, color Doppler, and power Doppler as needed of all accessible portions of each vessel. Bilateral testing is considered an integral part of a complete examination. Limited examinations for reoccurring indications may be performed as noted. The reflux portion of the exam is performed with the patient in reverse Trendelenburg.  +-----+---------------+---------+-----------+----------+--------------+  RIGHT Compressibility Phasicity Spontaneity Properties Thrombus Aging  +-----+---------------+---------+-----------+----------+--------------+  CFV   Full            Yes       Yes                                    +-----+---------------+---------+-----------+----------+--------------+   +---------+---------------+---------+-----------+----------+--------------+  LEFT      Compressibility Phasicity Spontaneity Properties Thrombus Aging  +---------+---------------+---------+-----------+----------+--------------+  CFV  Full            Yes        Yes                                    +---------+---------------+---------+-----------+----------+--------------+  SFJ       Full                                                             +---------+---------------+---------+-----------+----------+--------------+  FV Prox   Full                                                             +---------+---------------+---------+-----------+----------+--------------+  FV Mid    Full                                                             +---------+---------------+---------+-----------+----------+--------------+  FV Distal Full                                                             +---------+---------------+---------+-----------+----------+--------------+  PFV       Full                                                             +---------+---------------+---------+-----------+----------+--------------+  POP       Full            Yes       Yes                                    +---------+---------------+---------+-----------+----------+--------------+  PTV       Full                                                             +---------+---------------+---------+-----------+----------+--------------+  PERO      Full                                                             +---------+---------------+---------+-----------+----------+--------------+  Summary: RIGHT: - No evidence of common femoral vein obstruction.  LEFT: - There is no evidence of deep vein thrombosis in the lower extremity.  - No cystic structure found in the popliteal fossa.  *See table(s) above for measurements and observations.    Preliminary     Procedures Procedures (including critical care time)  Medications Ordered in ED Medications  sodium chloride flush (NS) 0.9 % injection 3 mL (3 mLs Intravenous Not Given 10/14/19 0845)  sodium chloride 0.9 % bolus 500 mL (0 mLs Intravenous Stopped 10/14/19 0932)    ED Course  I have reviewed the triage vital signs and the  nursing notes.  Pertinent labs & imaging results that were available during my care of the patient were reviewed by me and considered in my medical decision making (see chart for details).    MDM Rules/Calculators/A&P                      ANAY WALTER is a 34 y.o. female with a past medical history significant for diabetes, anemia, and severe reflux who is scheduled to have a barium swallow on Monday who also reports a prior incomplete cardiac work-up history who presents with chest pain and fast palpitations overnight.  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  She is being managed by gastroenterology and reports that Zofran has worked for her in the past and she takes it regularly.  She reports that last night she started having recurrent nausea and vomiting and was feeling somewhat dehydrated.  She says that she took her Zofran and went to bed and then around 3 AM, she woke with severe chest pain that was greater than 10 out of 10 in severity associated with very fast palpitations.  She reports this lasted for around 1 hour but improved.  She reports her fast heart rate is still coming and going at times but she is still having some chest discomfort.  She reports that it is now a 4 out of 10 and improved.  She reports it was more of a crushing aching pain in her central chest that radiated to her right shoulder.  She report associated shortness of breath but she denies any syncope.  She denies recent fevers, chills, or cough.  She denies any urinary symptoms or GI symptoms otherwise from the nausea and vomiting.  She denies any trauma.  She does report that she has had some intermittent left leg pain and swelling that has come and gone over the last few weeks.  It is currently gone.  She denies any history of DVT or PE.  She does report a family history of some different heart problems including "a hole in the heart and a leaky  valve" for 2 of her family members.  She says that several years ago, she was seen by cardiology and was told she needed a heart catheterization because of arrhythmias and an abnormal EKG however she wanted to go to her family members high school graduation so she declined the procedure and left.     Patient says that she has not had this chest pain before and is concerned about it.    On exam, lungs are clear.  Patient does have a slight murmur.  Patient has a tachycardic pulse.  Abdomen and chest were nontender and I cannot reproduce the discomfort.  Pulses are present in all extremities.  Legs  are not significantly edematous or tender.  No back tenderness or flank pain.  Patient is resting comfortably with some tachycardia.  EKG showed a wide complex tachycardia with a prolonged QTC of over 600.  When looking at prior EKGs, she has had the wide-complex rhythm in the past.  No STEMI is seen.  Clinically I am somewhat concerned of the patient's chronic nausea and vomiting and Zofran use has potentially prolong her QTC causing her to have episodes of tachycardia and arrhythmias.  This may contribute to the chest discomfort.  We will give some fluids given the nausea and vomiting to help hydrate her and will also do other work-up.  We will do an ultrasound of her leg due to the pain and swelling to rule out DVT and a D-dimer as well.  We will add on a BNP and other screening lab work.  Due to the prolonged QTC, abnormal EKG, and a concerning description of her crushing chest pain lasting around 1 hour with her symptoms, I anticipate we will touch base with cardiology after work-up is completed.  Anticipate reassessment after work-up.  10:19 AM Patient's other labs returned somewhat reassuring.  Her BNP is not elevated, doubt heart failure.  Her magnesium was not elevated and not low.  Her hepatic function panel was normal.  Her ultrasound showed no DVT.  Still waiting on D-dimer.  Due to the patient's  long QTc, abnormal EKG, tachycardia, concerning chest pain and history of supposedly needing a heart catheterization, will call cardiology for recommendations.  Cardiology saw the patient and felt she was safe for discharge home.  They want to see her as an outpatient and get an echo.  She is agreeable this plan.  D-dimer did return positive so we will get a CT PE study for discharge.  If CT PE study is reassuring, she will be discharged home.  Due to her nausea and prolonged QTC, we will give her prescription for Ativan.  Patient care transferred to Dr. Tamera Punt while waiting for results of CT PE study.  Anticipate discharge with cardiology close follow-up and good return precautions after imaging is completed.   Final Clinical Impression(s) / ED Diagnoses Final diagnoses:  Chest pain, unspecified type  Palpitations  Abnormal ECG  Non-intractable vomiting with nausea, unspecified vomiting type  Lightheadedness  Tachycardia    Rx / DC Orders ED Discharge Orders    None      Clinical Impression: 1. Chest pain, unspecified type   2. Palpitations   3. Abnormal ECG   4. Non-intractable vomiting with nausea, unspecified vomiting type   5. Lightheadedness   6. Tachycardia     Disposition: Patient care transferred to Dr. Tamera Punt while waiting for results of CT PE study.  Anticipate discharge with cardiology close follow-up and good return precautions after imaging is completed.   This note was prepared with assistance of Systems analyst. Occasional wrong-word or sound-a-like substitutions may have occurred due to the inherent limitations of voice recognition software.     Zabella Wease, Gwenyth Allegra, MD 10/14/19 [phone]

## 2019-10-14 NOTE — ED Notes (Signed)
Called lab do additional test on current sample.

## 2019-10-14 NOTE — Telephone Encounter (Signed)
Candace [name],    See patient message regarding vomiting since last evening.  Compazine 25 mg suppository, one every 8 hours as needed for vomiting if unable to take oral meds. Disp#12, RF 1  Cancel barium swallow  Schedule Gastric emptying study for about 2 weeks from now and an office visit with me to follow.  Please call patient with this advice.  Also, please ask her to call office directly and speak with nursing in future for urgent matters like this.  Portal message is not necessarily seen quickly by a physician when in office hours or endoscopy lab.  - HD

## 2019-10-14 NOTE — Discharge Instructions (Signed)
Please follow-up with cardiology as they directed for outpatient echo and further management.  For your nausea and vomiting, please use the new Ativan medication to help with the nausea when you really need to but please be careful not to drive or operate heavy machinery with it.  Please rest and stay hydrated.  If any symptoms change or worsen, please return to the nearest emergency department immediately.

## 2019-10-14 NOTE — ED Notes (Signed)
Patient verbalizes understanding of discharge instructions. Opportunity for questioning and answers were provided. Armband removed by staff, pt discharged from ED.  

## 2019-10-14 NOTE — ED Provider Notes (Signed)
CT scan was negative for PE or other acute abnormality.  Her other labs are nonconcerning.  Cardiology has seen the patient and discussed close outpatient follow-up.  Patient was discharged home in good condition with discharge instruction/prescription per Dr. Rush Landmark.  I did reinforce with her that she needs to refrain from taking her other antiemetics that would further prolong her QT interval.   Rolan Bucco, MD 10/14/19 [phone]

## 2019-10-14 NOTE — Consult Note (Addendum)
Cardiology Consultation:   Patient ID: CEYLIN DREIBELBIS MRN: [id number]; DOB: 1985-08-30  Admit date: 10/14/2019 Date of Consult: 10/14/2019  Primary Care Provider: Salley Scarlet, MD [facility] Cardiologist: Chrystie Nose, MD  [facility] Electrophysiologist:  None    Patient Profile:   Candace [name] is a 34 y.o. female with a hx of anemia borderline DM, STDs, irregular HR and hx of nausea and vomiting since 2017 now increased followed by GI  and  who is being seen today for the evaluation of chest pain with nausea and vomiting at the request of Dr. Julieanne Manson.  History of Present Illness:   Candace [name] with no cardiac hx but GI problems of N&V for several years beginning in 2017.  Recently this has increased.  She had endoscopy 10/05/19 which was normal.  Felt to be idiopathic nausea and vomiting but may also be atypical GERD symptoms and to have a barium swallow scheduled for 10/17/19.    Today presented to ER with central chest pain mild SOB and vomiting.  She had a lot of vomiting yesterday, then was wakened from sleep at 0300 with heart pounding and sharp chest pain.  It did not resolve so she drove herself to ER.  Now it comes and goes.   HER HR was 120 on arrival.  When she had a child at [facility] she had fluid around heart but I cannot find echo.   EKG:  The EKG was personally reviewed and demonstrates:  ST at 120 with short PR and intra ventricular conduction delay and LVH present since 2014. Telemetry:  Telemetry was personally reviewed and demonstrates:  SR to ST Venous doppler neg for DVT.  Na 137, K+ 4.0 BUN 14, Cr. 0.94 Mg+ 1.9, LFTS normal  BNP 24 Hs Troponin <2  Twice INR 1.2 Not pregnant per lab and has IUD Drug screen neg CXR no active cardiopulmonary disease COVID neg 10/03/19 DDimer 0.91 +  BP 112/70 now HR at 88 SR    Past Medical History:  Diagnosis Date  . Allergy   . Anemia   . BV (bacterial vaginosis) 12/15/2012  . Diabetes (HCC)    borderline  DM per patient - denies DM  . Eczema   . Headache(784.0)   . History of chlamydia   . History of gonorrhea   . Irregular heartbeat   . Vaginal itching 12/15/2012  . Vaginal Pap smear, abnormal    f/u ok    Past Surgical History:  Procedure Laterality Date  . CERVICAL CONIZATION W/BX N/A 12/22/2018   Procedure: LASER ABLATION OF CERVIX;  Surgeon: Lazaro Arms, MD;  Location: [facility];  Service: Gynecology;  Laterality: N/A;  . DILATION AND CURETTAGE OF UTERUS N/A 11/22/2012   Procedure: SUCTION DILATATION AND CURETTAGE;  Surgeon: Lazaro Arms, MD;  Location: [facility];  Service: Gynecology;  Laterality: N/A;     Home Medications:  Prior to Admission medications   Medication Sig Start Date End Date Taking? Authorizing Provider  levonorgestrel (MIRENA) 20 MCG/24HR IUD 1 each by Intrauterine route once.   Yes [provider]  ondansetron (ZOFRAN ODT) 4 MG disintegrating tablet Take 1 tablet (4 mg total) by mouth every 8 (eight) hours as needed for nausea or vomiting. 10/05/19  Yes Danis, Starr Lake III, MD  sucralfate (CARAFATE) 1 g tablet Take 1 tablet (1 g total) by mouth 3 (three) times daily with meals. 08/26/19   Salley Scarlet, MD    Inpatient Medications:  Scheduled Meds: . sodium chloride flush  3 mL Intravenous Once   Continuous Infusions:  PRN Meds:   Allergies:    Allergies  Allergen Reactions  . Vicodin [Hydrocodone-Acetaminophen] Other (See Comments)    violent  . Percocet [Oxycodone-Acetaminophen]     Violent behaviors  . Latex Itching and Rash    Social History:   Social History   Socioeconomic History  . Marital status: Single    Spouse name: Not on file  . Number of children: 2  . Years of education: Not on file  . Highest education level: Some college, no degree  Occupational History  . Occupation: CNA    Comment: [employer]  Tobacco Use  . Smoking status: Never Smoker  . Smokeless tobacco: Never Used  Substance and Sexual Activity  .  Alcohol use: Not Currently    Comment: socially   . Drug use: No  . Sexual activity: Yes    Birth control/protection: I.U.D.  Other Topics Concern  . Not on file  Social History Narrative  . Not on file   Social Determinants of Health   Financial Resource Strain:   . Difficulty of Paying Living Expenses:   Food Insecurity:   . Worried About Programme researcher, broadcasting/film/video in the Last Year:   . Barista in the Last Year:   Transportation Needs:   . Freight forwarder (Medical):   Marland Kitchen Lack of Transportation (Non-Medical):   Physical Activity:   . Days of Exercise per Week:   . Minutes of Exercise per Session:   Stress:   . Feeling of Stress :   Social Connections:   . Frequency of Communication with Friends and Family:   . Frequency of Social Gatherings with Friends and Family:   . Attends Religious Services:   . Active Member of Clubs or Organizations:   . Attends Banker Meetings:   Marland Kitchen Marital Status:   Intimate Partner Violence:   . Fear of Current or Ex-Partner:   . Emotionally Abused:   Marland Kitchen Physically Abused:   . Sexually Abused:     Family History:    Family History  Problem Relation Age of Onset  . Prostate cancer Paternal Grandfather   . Breast cancer Paternal Grandmother   . Lung cancer Paternal Grandmother   . Lung cancer Maternal Grandfather   . Colon polyps Father   . Prostate cancer Father   . Healthy Brother   . Healthy Daughter   . Healthy Son   . Breast cancer Maternal Aunt        Multiple with Breast Cancer  . Cervical cancer Maternal Aunt   . Stroke Maternal Aunt   . Hypertension Other   . Diabetes Other   . Other Other        stomach tumors  . Heart disease Other        her mom, gma and aunts have arrythmia  . Kidney disease Paternal Aunt   . Colon cancer Paternal Aunt        colon problems  . Stroke Paternal Uncle        PGreatUncle  . Prostate cancer Paternal Uncle   . Breast cancer Cousin   . Esophageal cancer Neg Hx   .  Rectal cancer Neg Hx   . Stomach cancer Neg Hx      ROS:  Please see the history of present illness.  General:no colds or fevers, no weight changes Skin:no rashes or ulcers  HEENT:no blurred vision, no congestion CV:see HPI PUL:see HPI GI:no diarrhea constipation or melena, + indigestion, & N&V chronic and recently increased. GU:no hematuria, no dysuria MS:no joint pain, no claudication Neuro:no syncope, no lightheadedness Endo:borderline diabetes ? Only A1C is 2013 with results of 5.6 , no thyroid disease  All other ROS reviewed and negative.     Physical Exam/Data:   Vitals:   10/14/19 0745 10/14/19 0930 10/14/19 1030 10/14/19 1115  BP: 113/76 108/68 111/80 119/78  Pulse: (!) 102 90 94 90  Resp: 19 19 16 14   Temp:      TempSrc:      SpO2: 100% 100% 100% 100%  Weight:      Height:       No intake or output data in the 24 hours ending 10/14/19 1220 Last 3 Weights 10/14/2019 10/05/2019 09/15/2019  Weight (lbs) 242 lb 8.1 oz 210 lb 210 lb  Weight (kg) 110 kg 95.255 kg 95.255 kg     Body mass index is 44.35 kg/m.  General:  Well nourished, well developed, in no acute distress, concerned about the chest pain HEENT: normal Lymph: no adenopathy Neck: no JVD Endocrine:  No thryomegaly Vascular: No carotid bruits; FA pulses 2+ bilaterally without bruits  Cardiac:  normal S1, S2; RRR; no murmur gallup rub or click- chest wall very tender to palpation. Lungs:  clear to auscultation bilaterally, no wheezing, rhonchi or rales  Abd: soft, nontender, no hepatomegaly  Ext: no edema Musculoskeletal:  No deformities, BUE and BLE strength normal and equal Skin: warm and dry  Neuro:  Alert and oriented X 3 MAE follows commands, no focal abnormalities noted Psych:  Normal affect   Relevant CV Studies: none   Laboratory Data:  High Sensitivity Troponin:   Recent Labs  Lab 10/14/19 0354 10/14/19 0614  TROPONINIHS <2 <2     Chemistry Recent Labs  Lab 10/14/19 0354  NA 137  K  4.0  CL 105  CO2 21*  GLUCOSE 127*  BUN 14  CREATININE 0.94  CALCIUM 8.7*  GFRNONAA >60  GFRAA >60  ANIONGAP 11    Recent Labs  Lab 10/14/19 0746  PROT 7.1  ALBUMIN 3.9  AST 21  ALT 27  ALKPHOS 62  BILITOT 1.1   Hematology Recent Labs  Lab 10/14/19 0354  WBC 6.4  RBC 4.75  HGB 13.5  HCT 42.3  MCV 89.1  MCH 28.4  MCHC 31.9  RDW 13.8  PLT 263   BNP Recent Labs  Lab 10/14/19 0751  BNP 24.4    DDimer  Recent Labs  Lab 10/14/19 0930  DDIMER 0.91*     Radiology/Studies:  DG Chest 2 View  Result Date: 10/14/2019 CLINICAL DATA:  Chest pain EXAM: CHEST - 2 VIEW COMPARISON:  None. FINDINGS: The heart size and mediastinal contours are within normal limits. Both lungs are clear. The visualized skeletal structures are unremarkable. IMPRESSION: No active cardiopulmonary disease. Electronically Signed   By: 12/14/2019 M.D.   On: 10/14/2019 04:36   VAS 12/14/2019 LOWER EXTREMITY VENOUS (DVT) (ONLY [facility] & [facility])  Result Date: 10/14/2019  Lower Venous DVTStudy Indications: Swelling.  Risk Factors: None identified. Comparison Study: No prior studies. Performing Technologist: 12/14/2019 RVT  Examination Guidelines: A complete evaluation includes B-mode imaging, spectral Doppler, color Doppler, and power Doppler as needed of all accessible portions of each vessel. Bilateral testing is considered an integral part of a complete examination. Limited examinations for reoccurring indications may be performed as noted. The  reflux portion of the exam is performed with the patient in reverse Trendelenburg.  +-----+---------------+---------+-----------+----------+--------------+ RIGHTCompressibilityPhasicitySpontaneityPropertiesThrombus Aging +-----+---------------+---------+-----------+----------+--------------+ CFV  Full           Yes      Yes                                 +-----+---------------+---------+-----------+----------+--------------+    +---------+---------------+---------+-----------+----------+--------------+ LEFT     CompressibilityPhasicitySpontaneityPropertiesThrombus Aging +---------+---------------+---------+-----------+----------+--------------+ CFV      Full           Yes      Yes                                 +---------+---------------+---------+-----------+----------+--------------+ SFJ      Full                                                        +---------+---------------+---------+-----------+----------+--------------+ FV Prox  Full                                                        +---------+---------------+---------+-----------+----------+--------------+ FV Mid   Full                                                        +---------+---------------+---------+-----------+----------+--------------+ FV DistalFull                                                        +---------+---------------+---------+-----------+----------+--------------+ PFV      Full                                                        +---------+---------------+---------+-----------+----------+--------------+ POP      Full           Yes      Yes                                 +---------+---------------+---------+-----------+----------+--------------+ PTV      Full                                                        +---------+---------------+---------+-----------+----------+--------------+ PERO     Full                                                        +---------+---------------+---------+-----------+----------+--------------+  Summary: RIGHT: - No evidence of common femoral vein obstruction.  LEFT: - There is no evidence of deep vein thrombosis in the lower extremity.  - No cystic structure found in the popliteal fossa.  *See table(s) above for measurements and observations.    Preliminary        HEAR Score (for undifferentiated chest pain):     1   Assessment  and Plan:   1. Chest pain with neg troponin, neg BNP, hx of N&V for sometime and recently increased being evaluated by GI for barium swallow next week.  Chest wall tender to touch, most likely pain due to muscular with N&V yesterday.  She did have rapid heart beat when she woke.   2. Abnormal EKG with short PR, +intraventricular conduction delay and LVH.  This has been present since 2014.  She would benefit with echo to eval- Dr. Rennis GoldenHilty to see  3. Elevated d-dimer would do chest CTA to rule out PE.     outpt echo and follow up For questions or updates, please contact [facility] Please consult [email or web address] for contact info under    Signed, Nada BoozerLaura Adrean Findlay, NP  10/14/2019 12:20 PM

## 2019-10-14 NOTE — ED Notes (Signed)
Notified EDP about 4/10 CP. Called lab to follow up on d dimer, it is running at this time.

## 2019-10-14 NOTE — ED Notes (Signed)
CT notified that pt is ready for scan.

## 2019-10-14 NOTE — ED Notes (Signed)
Unsuccessful IV attempt.

## 2019-10-14 NOTE — ED Notes (Signed)
Called to f/u on d dimer. Lab says it resulted at 11:18. Informed lab we cannot see results. They will fax to tube station [phone].

## 2019-10-14 NOTE — ED Notes (Signed)
Sending blue top per lab request.

## 2019-10-14 NOTE — Progress Notes (Signed)
Left lower extremity venous duplex has been completed. Preliminary results can be found in CV Proc through chart review.  Results were given to Dr. Rush Landmark.  10/14/19 9:14 AM Olen Cordial RVT

## 2019-10-17 ENCOUNTER — Other Ambulatory Visit: Payer: Self-pay

## 2019-10-17 ENCOUNTER — Ambulatory Visit (HOSPITAL_COMMUNITY)
Admission: RE | Admit: 2019-10-17 | Discharge: 2019-10-17 | Disposition: A | Discharge status: Home / Self Care | Payer: No Typology Code available for payment source | Source: Ambulatory Visit | Attending: Gastroenterology | Admitting: Gastroenterology

## 2019-10-17 DIAGNOSIS — R112 Nausea with vomiting, unspecified: Secondary | ICD-10-CM | POA: Diagnosis present

## 2019-10-17 NOTE — Telephone Encounter (Signed)
Bonita Quin,    Thanks for your note letting me know this patient went to the ED on 10/14/19.  I reviewed the ED provider note and some test results.  Patient began having chest pain and palpitations during vomiting, EKG with some abnormalities requiring discontinuation of ondansetron.  The compazine should not be used either, so if that prescription was sent, please cancel it at the pharmacy and tell patient not to take either medicine.  I understand that leaves her without anti-emetic medicine now, but unfortunately that must be the case at least until after cardiology evaluation (which is apparently planned).  - HD

## 2019-10-18 ENCOUNTER — Telehealth: Payer: Self-pay | Admitting: Gastroenterology

## 2019-10-18 NOTE — Telephone Encounter (Signed)
[place],    Please let me know what you think about the EKG abnormalities on this patient.  I read your ED consult note and see that echo is pending.  Conduction delay seems to possibly cause artificial prolongation of QTC, but would like your insight when you have enough information to do so. Trying to decide what, if any, anti-emetics are safe to use here.   Amada Jupiter Brooke Dare

## 2019-10-20 NOTE — Telephone Encounter (Signed)
The QTc is prolonged due to QRS prolongation - basically, you have to take about 80 msec off that number, so it may still be long at 520 msec. Will follow-up after echo - I would say if you use an anti-emetic, would recheck EKG about 1 week after starting if it is given regularly (not PRN).  -[place]

## 2019-10-26 ENCOUNTER — Telehealth: Payer: Self-pay | Admitting: Gastroenterology

## 2019-10-26 ENCOUNTER — Other Ambulatory Visit: Payer: Self-pay

## 2019-10-26 DIAGNOSIS — R109 Unspecified abdominal pain: Secondary | ICD-10-CM

## 2019-10-26 DIAGNOSIS — R112 Nausea with vomiting, unspecified: Secondary | ICD-10-CM

## 2019-10-26 NOTE — Telephone Encounter (Signed)
Please arrange a gastric emptying study for this patient.

## 2019-10-26 NOTE — Telephone Encounter (Signed)
Thanks very much.  Just keep me posted and we'll communicate as needed about this.  If resume anti-emetics, will ask your office to arrange EKG.

## 2019-10-26 NOTE — Telephone Encounter (Signed)
Left message for pt to call back. Pt scheduled for GES at [facility] 11/11/19@7 :30am, pt to arrive there at 7:15am and be NPO after midnight. Pt to hold stomach meds for 8 hours prior to procedure.  Pt aware of appt but states she has to work this day. Pt given the phone number [phone] to  Call and move the appt to a different date.

## 2019-11-02 ENCOUNTER — Ambulatory Visit (HOSPITAL_COMMUNITY): Payer: No Typology Code available for payment source | Attending: Internal Medicine

## 2019-11-02 ENCOUNTER — Other Ambulatory Visit: Payer: Self-pay

## 2019-11-02 DIAGNOSIS — R9431 Abnormal electrocardiogram [ECG] [EKG]: Secondary | ICD-10-CM | POA: Insufficient documentation

## 2019-11-09 ENCOUNTER — Ambulatory Visit (HOSPITAL_COMMUNITY)
Admission: RE | Admit: 2019-11-09 | Discharge: 2019-11-09 | Disposition: A | Discharge status: Home / Self Care | Payer: No Typology Code available for payment source | Source: Ambulatory Visit | Attending: Gastroenterology | Admitting: Gastroenterology

## 2019-11-09 ENCOUNTER — Other Ambulatory Visit: Payer: Self-pay

## 2019-11-09 DIAGNOSIS — R109 Unspecified abdominal pain: Secondary | ICD-10-CM | POA: Diagnosis present

## 2019-11-09 DIAGNOSIS — R112 Nausea with vomiting, unspecified: Secondary | ICD-10-CM | POA: Insufficient documentation

## 2019-11-09 MED ORDER — TECHNETIUM TC 99M SULFUR COLLOID
2.2000 | Freq: Once | INTRAVENOUS | Status: AC
  Administered 2019-11-09: 2.2 via ORAL

## 2019-11-10 ENCOUNTER — Ambulatory Visit: Payer: No Typology Code available for payment source | Admitting: Physician Assistant

## 2019-11-11 ENCOUNTER — Encounter (HOSPITAL_COMMUNITY): Payer: No Typology Code available for payment source

## 2019-11-30 ENCOUNTER — Other Ambulatory Visit (INDEPENDENT_AMBULATORY_CARE_PROVIDER_SITE_OTHER): Payer: No Typology Code available for payment source

## 2019-11-30 ENCOUNTER — Encounter: Payer: Self-pay | Admitting: Gastroenterology

## 2019-11-30 ENCOUNTER — Ambulatory Visit: Payer: No Typology Code available for payment source | Admitting: General Practice

## 2019-11-30 ENCOUNTER — Ambulatory Visit (INDEPENDENT_AMBULATORY_CARE_PROVIDER_SITE_OTHER): Payer: No Typology Code available for payment source | Admitting: Gastroenterology

## 2019-11-30 VITALS — BP 102/68 | HR 100 | Ht 62.0 in | Wt 209.0 lb

## 2019-11-30 DIAGNOSIS — R112 Nausea with vomiting, unspecified: Secondary | ICD-10-CM | POA: Diagnosis not present

## 2019-11-30 DIAGNOSIS — K219 Gastro-esophageal reflux disease without esophagitis: Secondary | ICD-10-CM

## 2019-11-30 LAB — CREATININE, SERUM: Creatinine, Ser: 1.02 mg/dL (ref 0.40–1.20)

## 2019-11-30 LAB — BUN: BUN: 14 mg/dL (ref 6–23)

## 2019-11-30 NOTE — Progress Notes (Signed)
GI Progress Note  Chief Complaint: Vomiting and GERD  Subjective  History:  Candace [name] is here for follow-up of her nausea vomiting and GERD.  ED visit 10/14/19 for symptoms triggering apparent panic attack. EKG with prolonged QTC, raising concern for using anti-emetics. Also IVCD, Echo results on file - no cardiology follow up yet to determine if ischemic workup needed. I communicated with cardiology re: use of anti-emetics.  Dr. Blanchie Dessert reply: "The QTc is prolonged due to QRS prolongation - basically, you have to take about 80 msec off that number, so it may still be long at 520 msec. Will follow-up after echo - I would say if you use an anti-emetic, would recheck EKG about 1 week after starting if it is given regularly (not PRN)."   The problem has continued about the same, with daily episodes of postprandial vomiting, and less often nausea.  It is most often triggered by laying flat.  As before, I questioned her further about how it behaves, and it most often sounds like severe regurgitation that then triggers vomiting.  Within an hour or 2 after eating she will have food come back up into the chest or throat more often all the way out.  Of note, it occurred with her laying flat during the [facility] (see report).   ROS: Cardiovascular:  no chest pain Respiratory: no dyspnea She denies headaches, visual disturbance numbness or weakness. Chronic bilateral hand tremor for which she saw Dr. Arbutus Leas in July 2020 (note reviewed, felt to be mild and benign)  The patient's Past Medical, Family and Social History were reviewed and are on file in the EMR.  Objective:  Med list reviewed  Current Outpatient Medications:  .  levonorgestrel (MIRENA) 20 MCG/24HR IUD, 1 each by Intrauterine route once., Disp: , Rfl:    Vital signs in last 24 hrs: Vitals:   11/30/19 1353  BP: 102/68  Pulse: 100    Physical Exam  No acute distress  HEENT: sclera anicteric, oral mucosa moist  without lesions  Neck: supple, no thyromegaly, JVD or lymphadenopathy  Cardiac: RRR without murmurs, S1S2 heard, no peripheral edema  Pulm: clear to auscultation bilaterally, normal RR and effort noted  Abdomen: soft, no tenderness, with active bowel sounds. No guarding or palpable hepatosplenomegaly.  Less than 10 seconds after laying her back for exam she needs to sit upright immediately and go to the sink, and then vomited liquid stomach contents several times.  Skin; warm and dry, no jaundice or rash  Labs:   ___________________________________________ Radiologic studies:  nml GES ___________________________  CLINICAL DATA:  Post prandial vomiting. Early satiety. Normal gastric emptying   EXAM: ESOPHOGRAM / BARIUM SWALLOW / BARIUM TABLET STUDY   TECHNIQUE: Combined double contrast and single contrast examination performed using effervescent crystals, thick barium liquid, and thin barium liquid. The patient was observed with fluoroscopy swallowing a 13 mm barium sulphate tablet.   FLUOROSCOPY TIME:  Fluoroscopy Time:  2 minutes 36 seconds   Radiation Exposure Index (if provided by the fluoroscopic device): 40.5 mGy   Number of Acquired Spot Images: 11   COMPARISON:  CT chest 10/14/2019   FINDINGS: No swallowing dysfunction in the hypopharynx or high cervical esophagus. No mucosal irregularity within the thoracic esophagus or distal esophagus. Contrast flowed easily through the GE junction.   When patient transitioned from erect to supine positioning, vomiting was elicited.   And additional small volume of contrast was administered in semi prone RAO positioning and normal  esophageal motility was demonstrated.   Continued gastroesophageal reflux was demonstrated in supine position.   Moderate hiatal hernia present.   13 mm tablet passed GE junction easily.   IMPRESSION: 1. Moderate gastroesophageal reflux. 2. Vomiting response elicited when  transitioning from erect to supine positioning. 3. Small to moderate size hiatal hernia. 4. No esophageal mucosal abnormality identified.     Electronically Signed   By: Genevive Bi M.D.   On: 10/17/2019 11:30  ____________________________________________ Other:   _____________________________________________ Assessment & Plan  Assessment: Encounter Diagnoses  Name Primary?  . Gastroesophageal reflux disease without esophagitis Yes  . Nausea and vomiting in adult    She definitely has GERD based on symptoms and upper GI series findings, though she has often characterizes it as "vomiting".  She does not have any central neurologic symptoms, but I am getting a CT scan of the head with IV contrast to be certain there is no central cause of this. I am not aware of a primary neurologic or cardiovascular condition that would cause this without other associated neurologic symptoms or altered blood pressure.  However, I will communicate with her cardiologist and neurologist to see if they have ever seen such a phenomenon.  Gastric emptying is normal, as it was a few years ago when the symptoms were evaluated but less severe.  I am still very concerned about the use of antiemetics given her prolonged QT.  More than that, I think they are unlikely to be very helpful, as the nausea is much less frequent and prominent then this severe reflux that triggers vomiting.  We discussed how acid suppression medicine will have limited benefit in this scenario because her main symptom is not heartburn.  If this truly is all severe GERD, then we must consider a surgical solution such as fundoplication or, at her BMI, gastric bypass.  Plan: She has follow-up with her cardiologist in the next couple of weeks.  I will forward notes to them and the neurologist and then communicate with Cala Bradford after those discussions and when I see if she needs any further cardiac work-up.   30 minutes were spent on  this encounter (including chart review, history/exam, counseling/coordination of care, and documentation)  Charlie Pitter III

## 2019-11-30 NOTE — Patient Instructions (Signed)
If you are age 35 or older, your body mass index should be between 23-30. Your Body mass index is 38.23 kg/m. If this is out of the aforementioned range listed, please consider follow up with your Primary Care Provider.  If you are age 33 or younger, your body mass index should be between 19-25. Your Body mass index is 38.23 kg/m. If this is out of the aformentioned range listed, please consider follow up with your Primary Care Provider.   Your provider has requested that you go to the basement level for lab work before leaving today. Press "B" on the elevator. The lab is located at the first door on the left as you exit the elevator.  Due to recent changes in healthcare laws, you may see the results of your imaging and laboratory studies on MyChart before your provider has had a chance to review them.  We understand that in some cases there may be results that are confusing or concerning to you. Not all laboratory results come back in the same time frame and the provider may be waiting for multiple results in order to interpret others.  Please give [place] 48 hours in order for your provider to thoroughly review all the results before contacting the office for clarification of your results.   You have been scheduled for a CT scan of the abdomen and pelvis at [facility] ([address]---this is in the same building as [employer]).   You are scheduled on 12-14-2019 at [place] should arrive 15 minutes prior to your appointment time for registration. Please follow the written instructions below on the day of your exam:  WARNING: IF YOU ARE ALLERGIC TO IODINE/X-RAY DYE, PLEASE NOTIFY RADIOLOGY IMMEDIATELY AT [phone]! YOU WILL BE GIVEN A 13 HOUR PREMEDICATION PREP.  1) Do not eat anything after 5am (4 hours prior to your test)  You may take any medications as prescribed with a small amount of water, if necessary. If you take any of the following medications: METFORMIN,  GLUCOPHAGE, GLUCOVANCE, AVANDAMET, RIOMET, FORTAMET, [place] MET, JANUMET, GLUMETZA or METAGLIP, you MAY be asked to HOLD this medication 48 hours AFTER the exam.  The purpose of you drinking the oral contrast is to aid in the visualization of your intestinal tract. The contrast solution may cause some diarrhea. Depending on your individual set of symptoms, you may also receive an intravenous injection of x-ray contrast/dye. Plan on being at [facility] for 30 minutes or longer, depending on the type of exam you are having performed.  This test typically takes 30-45 minutes to complete.  If you have any questions regarding your exam or if you need to reschedule, you may call the CT department at [phone] between the hours of 8:00 am and 5:00 pm, Monday-Friday.  ________________________________________________________________________   It was a pleasure to see you today!  Dr. Loletha Carrow

## 2019-12-06 NOTE — Progress Notes (Signed)
Cardiology Clinic Note   Patient Name: Candace [name] Date of Encounter: 12/07/2019  Primary Care Provider:  Salley Scarlet, MD Primary Cardiologist:  Chrystie Nose, MD  Patient Profile    Candace [name] 34 year old female presents today for follow-up evaluation of her chest pain.  Past Medical History    Past Medical History:  Diagnosis Date  . Allergy   . Anemia   . BV (bacterial vaginosis) 12/15/2012  . Diabetes (HCC)    borderline DM per patient - denies DM  . Eczema   . Headache(784.0)   . History of chlamydia   . History of gonorrhea   . Irregular heartbeat   . Vaginal itching 12/15/2012  . Vaginal Pap smear, abnormal    f/u ok   Past Surgical History:  Procedure Laterality Date  . CERVICAL CONIZATION W/BX N/A 12/22/2018   Procedure: LASER ABLATION OF CERVIX;  Surgeon: Lazaro Arms, MD;  Location: [facility];  Service: Gynecology;  Laterality: N/A;  . DILATION AND CURETTAGE OF UTERUS N/A 11/22/2012   Procedure: SUCTION DILATATION AND CURETTAGE;  Surgeon: Lazaro Arms, MD;  Location: [facility];  Service: Gynecology;  Laterality: N/A;    Allergies  Allergies  Allergen Reactions  . Vicodin [Hydrocodone-Acetaminophen] Other (See Comments)    violent  . Percocet [Oxycodone-Acetaminophen]     Violent behaviors  . Latex Itching and Rash    History of Present Illness    Candace [name] is PMH of diabetes, iron deficiency anemia, obesity, hyperlipidemia, and chest pain.  She presented to the emergency department on 10/14/2019 with complaints of chest pain.  She described the pain as substernal, aching/pressure, nonradiating, moderate, sudden onset, it was relieved on its own with no medication.  Her CT scan was negative for PE and no other acute abnormalities.  Blood pressure 112/70, EKG sinus tachycardia with nonspecific intraventricular conduction block wide QRS LVH repolarization abnormality.  High-sensitivity troponins less than 2.  She was having episodes of nausea  and vomiting that was followed by GI.  She was taken Zofran for this.  It was felt that her Zofran was potentially prolonging her QTC.  Her echocardiogram showed LVEF of 50-55%, left regional wall abnormalities, and no valvular abnormalities.  She presents to the clinic today for follow-up evaluation and states she has continued to have intermittent episodes of chest discomfort since being in the emergency department.  She states that she has had approximately 5-6 episodes.  These episodes last for 15 to 20 minutes and up to 1 hour.  They were relieved by rest.  She considers them to be sharp in nature.  She states that she is somewhat physically active exercising 2-3 times per week for up to 1 hour.  She has been seen by GI who are evaluating her for her severe reflux and nauseousness.  Case was reviewed with Dr. Rennis Golden and due to her reassuring echocardiogram we will not pursue ischemic evaluation.  I will prescribe her metoprolol tartrate 12.5 mg twice daily, order TSH, T3 and T4, and have her follow-up in 1 month for further evaluation.  She may proceed with further GI work-up.  Today she denies chest pain, shortness of breath, lower extremity edema, fatigue, palpitations, melena, hematuria, hemoptysis, diaphoresis, weakness, presyncope, syncope, orthopnea, and PND.   Home Medications    Prior to Admission medications   Medication Sig Start Date End Date Taking? Authorizing Provider  levonorgestrel (MIRENA) 20 MCG/24HR IUD 1 each by Intrauterine route once.  [provider]    Family History    Family History  Problem Relation Age of Onset  . Prostate cancer Paternal Grandfather   . Breast cancer Paternal Grandmother   . Lung cancer Paternal Grandmother   . Lung cancer Maternal Grandfather   . Colon polyps Father   . Prostate cancer Father   . Healthy Brother   . Healthy Daughter   . Healthy Son   . Breast cancer Maternal Aunt        Multiple with Breast Cancer  . Cervical  cancer Maternal Aunt   . Stroke Maternal Aunt   . Hypertension Other   . Diabetes Other   . Other Other        stomach tumors  . Heart disease Other        her mom, gma and aunts have arrythmia  . Kidney disease Paternal Aunt   . Colon cancer Paternal Aunt        colon problems  . Stroke Paternal Uncle        PGreatUncle  . Prostate cancer Paternal Uncle   . Breast cancer Cousin   . Esophageal cancer Neg Hx   . Rectal cancer Neg Hx   . Stomach cancer Neg Hx    She indicated that her mother is alive. She indicated that her father is alive. She indicated that her brother is alive. She indicated that her maternal grandmother is alive. She indicated that her maternal grandfather is deceased. She indicated that her paternal grandmother is deceased. She indicated that her paternal grandfather is deceased. She indicated that her daughter is alive. She indicated that her son is alive. She indicated that the status of her maternal aunt is unknown. She indicated that the status of her paternal aunt is unknown. She indicated that the status of her paternal uncle is unknown. She indicated that the status of her cousin is unknown. She indicated that the status of her neg hx is unknown. She indicated that the status of her other is unknown.  Social History    Social History   Socioeconomic History  . Marital status: Single    Spouse name: Not on file  . Number of children: 2  . Years of education: Not on file  . Highest education level: Some college, no degree  Occupational History  . Occupation: CNA    Comment: [employer]  Tobacco Use  . Smoking status: Never Smoker  . Smokeless tobacco: Never Used  Vaping Use  . Vaping Use: Never used  Substance and Sexual Activity  . Alcohol use: Not Currently    Comment: socially   . Drug use: No  . Sexual activity: Yes    Birth control/protection: I.U.D.  Other Topics Concern  . Not on file  Social History Narrative  . Not on file    Social Determinants of Health   Financial Resource Strain:   . Difficulty of Paying Living Expenses:   Food Insecurity:   . Worried About Programme researcher, broadcasting/film/video in the Last Year:   . Barista in the Last Year:   Transportation Needs:   . Freight forwarder (Medical):   Marland Kitchen Lack of Transportation (Non-Medical):   Physical Activity:   . Days of Exercise per Week:   . Minutes of Exercise per Session:   Stress:   . Feeling of Stress :   Social Connections:   . Frequency of Communication with Friends and Family:   . Frequency  of Social Gatherings with Friends and Family:   . Attends Religious Services:   . Active Member of Clubs or Organizations:   . Attends BankerClub or Organization Meetings:   Marland Kitchen. Marital Status:   Intimate Partner Violence:   . Fear of Current or Ex-Partner:   . Emotionally Abused:   Marland Kitchen. Physically Abused:   . Sexually Abused:      Review of Systems    General:  No chills, fever, night sweats or weight changes.  Cardiovascular:  No chest pain, dyspnea on exertion, edema, orthopnea, palpitations, paroxysmal nocturnal dyspnea. Dermatological: No rash, lesions/masses Respiratory: No cough, dyspnea Urologic: No hematuria, dysuria Abdominal:   No nausea, vomiting, diarrhea, bright red blood per rectum, melena, or hematemesis Neurologic:  No visual changes, wkns, changes in mental status. All other systems reviewed and are otherwise negative except as noted above.  Physical Exam    VS:  BP 110/82   Pulse (!) 124   Ht 5\' 2"  (1.575 m)   Wt (!) 208 lb 3.2 oz (94.4 kg)   SpO2 99%   BMI 38.08 kg/m  , BMI Body mass index is 38.08 kg/m. GEN: Well nourished, well developed, in no acute distress. HEENT: normal. Neck: Supple, no JVD, carotid bruits, or masses. Cardiac: Sinus tachycardia, no murmurs, rubs, or gallops. No clubbing, cyanosis, edema.  Radials/DP/PT 2+ and equal bilaterally.  Respiratory:  Respirations regular and unlabored, clear to auscultation  bilaterally. GI: Soft, nontender, nondistended, BS + x 4. MS: no deformity or atrophy. Skin: warm and dry, no rash. Neuro:  Strength and sensation are intact. Psych: Normal affect.  Accessory Clinical Findings    Recent Labs: 10/14/2019: ALT 27; B Natriuretic Peptide 24.4; Hemoglobin 13.5; Magnesium 1.9; Platelets 263; Potassium 4.0; Sodium 137 11/30/2019: BUN 14; Creatinine, Ser 1.02   Recent Lipid Panel    Component Value Date/Time   CHOL 196 10/22/2018 1140   TRIG 32 10/22/2018 1140   HDL 65 10/22/2018 1140   CHOLHDL 3.0 10/22/2018 1140   LDLCALC 121 (H) 10/22/2018 1140   LDLDIRECT 112 (H) 09/10/2011 0952    ECG personally reviewed by me today-sinus tachycardia nonspecific intraventricular block inferior infarct undetermined age 22 bpm- No acute changes  EKG 10/17/2019 Wide QRS rhythm nonspecific interventricular block LVH with repolarization abnormality 120 bpm  Echocardiogram 11/02/2019 IMPRESSIONS    1. Left ventricular ejection fraction, by estimation, is 50 to 55%. The  left ventricle has low normal function. The left ventricle demonstrates  regional wall motion abnormalities (see scoring diagram/findings for  description). There is mild asymmetric  left ventricular hypertrophy of the inferolateral segment. Tissue Doppler  low for age, probable grossly normal diastolic function.  2. Right ventricular systolic function is normal. The right ventricular  size is normal. There is normal pulmonary artery systolic pressure. The  estimated right ventricular systolic pressure is 22.9 mmHg.  3. The mitral valve is normal in structure. Trivial mitral valve  regurgitation. No evidence of mitral stenosis.  4. The aortic valve is tricuspid. Aortic valve regurgitation is trivial.  No aortic stenosis is present.  5. The inferior vena cava is normal in size with greater than 50%  respiratory variability, suggesting right atrial pressure of 3 mmHg.    Conclusion(s)/Recommendation(s): Basal ventricular septal thinning and  hypokinesis. In setting of abnormal ECG, consider cardiac MRI to evaluate  for granulomatous process.  Assessment & Plan   1.  Atypical chest pain/precordial pain -no chest pain today.  Has had occasional episodes of occasional sharp  chest discomfort that comes on at rest and with activity, lasting for several minutes to an hour.  Her chest discomfort dissipates dissipating without intervention.  Reassuring echocardiogram will not proceed with ischemic evaluation at this time. Heart healthy low-sodium diet-salty 6 given Increase physical activity as tolerated Start metoprolol tartrate 12.5 mg twice daily  Sinus tachycardia-heart rate today 115 bpm.  QTc decreased to 525 and PR interval 120.  Patient states that she has had increased heart rate for quite some time. Avoid stimulants, caffeine, chocolate, EtOH etc. Heart healthy low-sodium diet Maintain physical activity Start metoprolol 12.5 mg twice daily Order TSH, T3 and T4  Vomiting/GERD-continues with nausea and reflux.  Gastric emptying normal.  Still concern for antiemetics given prolonged QT.  Felt that antiemetics were not beneficial given less nausea and reflux symptoms.  Discussed surgical solutions/gastric bypass. Followed by GI  Disposition: Follow-up with Dr. Rennis Golden or me in 1 month.   Thomasene Ripple. Haleema Vanderheyden NP-C    12/07/2019, 4:58 PM [facility] [address] Office [phone] Fax [phone]

## 2019-12-07 ENCOUNTER — Encounter: Payer: Self-pay | Admitting: General Practice

## 2019-12-07 ENCOUNTER — Other Ambulatory Visit: Payer: Self-pay

## 2019-12-07 ENCOUNTER — Ambulatory Visit (INDEPENDENT_AMBULATORY_CARE_PROVIDER_SITE_OTHER): Payer: No Typology Code available for payment source | Admitting: General Practice

## 2019-12-07 VITALS — BP 110/82 | HR 124 | Ht 62.0 in | Wt 208.2 lb

## 2019-12-07 DIAGNOSIS — K219 Gastro-esophageal reflux disease without esophagitis: Secondary | ICD-10-CM | POA: Diagnosis not present

## 2019-12-07 DIAGNOSIS — Z79899 Other long term (current) drug therapy: Secondary | ICD-10-CM | POA: Diagnosis not present

## 2019-12-07 DIAGNOSIS — R000 Tachycardia, unspecified: Secondary | ICD-10-CM

## 2019-12-07 DIAGNOSIS — R072 Precordial pain: Secondary | ICD-10-CM

## 2019-12-07 DIAGNOSIS — R0789 Other chest pain: Secondary | ICD-10-CM | POA: Diagnosis not present

## 2019-12-07 MED ORDER — METOPROLOL TARTRATE 25 MG PO TABS
12.5000 mg | ORAL_TABLET | Freq: Two times a day (BID) | ORAL | 6 refills | Status: DC
Start: 2019-12-07 — End: 2020-01-30

## 2019-12-07 NOTE — Patient Instructions (Signed)
Medication Instructions:  START METOPROLOL TARTRATE 12.5MG (1/2 TAB) TWICE DAILY *If you need a refill on your cardiac medications before your next appointment, please call your pharmacy*  Lab Work: TSH, T3 AND T4 TODAY HERE IN OUR LAB If you have labs (blood work) drawn today and your tests are completely normal, you will receive your results only by:  MyChart Message (if you have MyChart) OR A paper copy in the mail.  If you have any lab test that is abnormal or we need to change your treatment, we will call you to review the results. You may go to any [facility] that is convenient for you however, we do have a lab in our office that is able to assist you. You DO NOT need an appointment for our lab. The lab is open 8:00am and closes at 4:00pm. Lunch 12:45 - 1:45pm.  Special Instructions TAKE AND LOG YOUR BLOOD PRESSURE-AT LEAST 1 HOUR AFTER TAKING YOUR MEDICATIONS, AND IF YOU ARE "FEELING BAD"  PLEASE READ AND FOLLOW SALTY 6-ATTACHED  Follow-Up: Your next appointment:  1 month(s)  In Person with JESSE CLEAVER, FNP-C  At [facility], you and your health needs are our priority.  As part of our continuing mission to provide you with exceptional heart care, we have created designated Provider Care Teams.  These Care Teams include your primary Cardiologist (physician) and Advanced Practice Providers (APPs -  Physician Assistants and Nurse Practitioners) who all work together to provide you with the care you need, when you need it.

## 2019-12-08 LAB — T4: T4, Total: 7.2 ug/dL (ref 4.5–12.0)

## 2019-12-08 LAB — T3: T3, Total: 107 ng/dL (ref 71–180)

## 2019-12-08 LAB — TSH: TSH: 0.998 u[IU]/mL (ref 0.450–4.500)

## 2019-12-12 ENCOUNTER — Ambulatory Visit (INDEPENDENT_AMBULATORY_CARE_PROVIDER_SITE_OTHER): Payer: No Typology Code available for payment source | Admitting: Adult Health

## 2019-12-12 ENCOUNTER — Telehealth: Payer: Self-pay | Admitting: Gastroenterology

## 2019-12-12 ENCOUNTER — Other Ambulatory Visit: Payer: Self-pay | Admitting: *Deleted

## 2019-12-12 ENCOUNTER — Other Ambulatory Visit (HOSPITAL_COMMUNITY)
Admission: RE | Admit: 2019-12-12 | Discharge: 2019-12-12 | Disposition: A | Discharge status: Home / Self Care | Payer: No Typology Code available for payment source | Source: Ambulatory Visit | Attending: Adult Health | Admitting: Adult Health

## 2019-12-12 ENCOUNTER — Encounter: Payer: Self-pay | Admitting: Adult Health

## 2019-12-12 ENCOUNTER — Telehealth: Payer: Self-pay | Admitting: Family Medicine

## 2019-12-12 VITALS — BP 113/82 | HR 64 | Ht 62.25 in | Wt 209.0 lb

## 2019-12-12 DIAGNOSIS — R6881 Early satiety: Secondary | ICD-10-CM

## 2019-12-12 DIAGNOSIS — Z01419 Encounter for gynecological examination (general) (routine) without abnormal findings: Secondary | ICD-10-CM | POA: Diagnosis not present

## 2019-12-12 DIAGNOSIS — Z8742 Personal history of other diseases of the female genital tract: Secondary | ICD-10-CM

## 2019-12-12 DIAGNOSIS — Z975 Presence of (intrauterine) contraceptive device: Secondary | ICD-10-CM | POA: Diagnosis not present

## 2019-12-12 DIAGNOSIS — R112 Nausea with vomiting, unspecified: Secondary | ICD-10-CM

## 2019-12-12 NOTE — Telephone Encounter (Signed)
Left message for pt that she does not need to hold her medication prior to scan.

## 2019-12-12 NOTE — Telephone Encounter (Signed)
Referral orders placed as patient has [place] [facility].

## 2019-12-12 NOTE — Telephone Encounter (Signed)
Patient states she needs another referral to [facility] she has an appointment with them for a CT Scan on Wednesday she states that her insurance is requesting a new referral.  CB# [phone]

## 2019-12-12 NOTE — Telephone Encounter (Signed)
Pt is requesting a call back from a nurse to inform if she should stop taking her heart medication prior to her CT scan

## 2019-12-12 NOTE — Progress Notes (Signed)
°  Subjective:     Patient ID: Candace [name], female   DOB: Nov 06, 1985, 34 y.o.   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  PCP is Dr Jeanice Lim.  Review of Systems If has any bleeding with IUD brown, IUD strings poke boyfriend,wants strings shortened.  Reviewed past medical,surgical, social and family history. Reviewed medications and allergies.     Objective:   Physical Exam BP 113/82 (BP Location: Left Arm, Patient Position: Sitting, Cuff Size: Normal)    Pulse 64    Ht 5' 2.25" (1.581 m)    Wt 209 lb (94.8 kg)    BMI 37.92 kg/m   Skin warm and dry.Pelvic: external genitalia is normal in appearance no lesions, vagina:pink with good moisture and rugae,urethra has no lesions or masses noted, cervix:smooth and bulbous,pap with GC/CHL and high risk HPV 16/18 genotyping performed, IUD strings trimmed, cervix bleed with EC brush, uterus: normal size, shape and contour, non tender, no masses felt, adnexa: no masses or tenderness noted. Bladder is non tender and no masses felt. Examination chaperoned by Nance Pear LPN   [facility] - 12/12/19 1136      Pregnancy Intention Screening   Does the patient want to become pregnant in the next year? No    Does the patient's partner want to become pregnant in the next year? No    Would the patient like to discuss contraceptive options today? No      Contraception Wrap Up   Current Method IUD or IUS    End Method IUD or IUS    Contraception Counseling Provided No             Assessment:     1. Encounter for gynecological examination with Papanicolaou smear of cervix Pap with GC/CHL and high risk HPV sent  Pap and physical in 1 year  ' 2. IUD (intrauterine device) in place Strings trimmed   3. History of abnormal cervical Pap smear     Plan:     Pap and physical in 1 year

## 2019-12-14 ENCOUNTER — Other Ambulatory Visit: Payer: Self-pay

## 2019-12-14 ENCOUNTER — Telehealth: Payer: Self-pay | Admitting: Gastroenterology

## 2019-12-14 ENCOUNTER — Ambulatory Visit (INDEPENDENT_AMBULATORY_CARE_PROVIDER_SITE_OTHER)
Admission: RE | Admit: 2019-12-14 | Discharge: 2019-12-14 | Disposition: A | Discharge status: Home / Self Care | Payer: No Typology Code available for payment source | Source: Ambulatory Visit | Attending: Gastroenterology | Admitting: Gastroenterology

## 2019-12-14 DIAGNOSIS — K219 Gastro-esophageal reflux disease without esophagitis: Secondary | ICD-10-CM | POA: Diagnosis not present

## 2019-12-14 DIAGNOSIS — R112 Nausea with vomiting, unspecified: Secondary | ICD-10-CM

## 2019-12-14 LAB — CYTOLOGY - PAP
Chlamydia: NEGATIVE
Comment: NEGATIVE
Comment: NEGATIVE
Comment: NORMAL
Diagnosis: NEGATIVE
High risk HPV: NEGATIVE
Neisseria Gonorrhea: NEGATIVE

## 2019-12-14 MED ORDER — IOHEXOL 300 MG/ML  SOLN
80.0000 mL | Freq: Once | INTRAMUSCULAR | Status: AC | PRN
  Administered 2019-12-14: 80 mL via INTRAVENOUS

## 2019-12-14 NOTE — Telephone Encounter (Signed)
CT was completed on 12-14-2019

## 2019-12-16 ENCOUNTER — Telehealth: Payer: Self-pay | Admitting: Gastroenterology

## 2019-12-16 NOTE — Telephone Encounter (Signed)
Candace [name],  I spoke to Candace [name] today about her severe GERD and normal head CT results.  I discussed the case with colleagues, and feel the best option for Candace [name] is consideration of a TIF.  However, she would need to lose enough weight to get her BMI 35 or under to meet current criteria for this procedure.  She is agreeable, so I would like to refer her to the healthy weight and wellness clinic at [facility] (Drs. Sharee Holster and Earlene Plater).  If she is able to achieve that goal, then she will have a consultation with Dr. Barron Alvine regarding possible TIF, and may need either repeat EGD or barium study before that, exact plans to be determined at that time.  Meanwhile, I still feel antiemetics are not likely to help her much with this, and are risky due to her prolonged QTc on EKG. She has to remain upright is much as possible, and sleep at about a 45 degree angle due to her severe reflux which triggers vomiting when she lays flat.  Smaller meal portion would also help her stomach empty faster, and therefore less likely to reflux.  ___________________  Gae Bon,    Thanks again for discussing the case.  We'll get this plan going.  Candace [name] is motivated to do whatever it takes to help this very troubling problem.  - HD

## 2019-12-19 ENCOUNTER — Other Ambulatory Visit: Payer: Self-pay

## 2019-12-19 DIAGNOSIS — R634 Abnormal weight loss: Secondary | ICD-10-CM

## 2019-12-19 NOTE — Telephone Encounter (Signed)
Pt aware, referral in epic for appt.

## 2019-12-21 MED FILL — METOPROLOL TARTRATE 25 MG T: 25 | 90 days supply | Qty: 90 | Fill #0

## 2020-01-17 ENCOUNTER — Ambulatory Visit (INDEPENDENT_AMBULATORY_CARE_PROVIDER_SITE_OTHER): Payer: No Typology Code available for payment source | Admitting: General Practice

## 2020-01-17 ENCOUNTER — Encounter: Payer: Self-pay | Admitting: General Practice

## 2020-01-17 ENCOUNTER — Other Ambulatory Visit: Payer: Self-pay

## 2020-01-17 VITALS — BP 98/82 | HR 100 | Temp 98.0°F

## 2020-01-17 DIAGNOSIS — K219 Gastro-esophageal reflux disease without esophagitis: Secondary | ICD-10-CM | POA: Diagnosis not present

## 2020-01-17 DIAGNOSIS — R000 Tachycardia, unspecified: Secondary | ICD-10-CM

## 2020-01-17 DIAGNOSIS — R0789 Other chest pain: Secondary | ICD-10-CM | POA: Diagnosis not present

## 2020-01-17 NOTE — Addendum Note (Signed)
Addended by: Bea Laura B on: 01/17/2020 03:25 PM   Modules accepted: Orders

## 2020-01-17 NOTE — Patient Instructions (Addendum)
Medication Instructions:  No Changes In Medications at this time.  *If you need a refill on your cardiac medications before your next appointment, please call your pharmacy*   Lab Work: None Ordered At This Time.  If you have labs (blood work) drawn today and your tests are completely normal, you will receive your results only by: Marland Kitchen MyChart Message (if you have MyChart) OR . A paper copy in the mail If you have any lab test that is abnormal or we need to change your treatment, we will call you to review the results.   Testing/Procedures: None Ordered At This Time.   Follow-Up: At [facility], you and your health needs are our priority.  As part of our continuing mission to provide you with exceptional heart care, we have created designated Provider Care Teams.  These Care Teams include your primary Cardiologist (physician) and Advanced Practice Providers (APPs -  Physician Assistants and Nurse Practitioners) who all work together to provide you with the care you need, when you need it.  Your next appointment:   3 month(s)  The format for your next appointment:   In Person  Provider:   You may see Chrystie Nose, MD or one of the following Advanced Practice Providers on your designated Care Team:    Edd Fabian, NP  Other Instructions Check BP twice weekly before taking BP meds and bring Blood Pressure log to next appointment.

## 2020-01-17 NOTE — Progress Notes (Signed)
Cardiology Clinic Note   Patient Name: Candace [name] Date of Encounter: 01/17/2020  Primary Care Provider:  Salley Scarlet, MD Primary Cardiologist:  Chrystie Nose, MD  Patient Profile    Candace [name] 34 year old female presents today for follow-up evaluation of her chest pain  Past Medical History    Past Medical History:  Diagnosis Date   Allergy    Anemia    BV (bacterial vaginosis) 12/15/2012   Diabetes (HCC)    borderline DM per patient - denies DM   Eczema    Headache(784.0)    History of chlamydia    History of gonorrhea    Irregular heartbeat    Vaginal itching 12/15/2012   Vaginal Pap smear, abnormal    f/u ok   Past Surgical History:  Procedure Laterality Date   CERVICAL CONIZATION W/BX N/A 12/22/2018   Procedure: LASER ABLATION OF CERVIX;  Surgeon: Lazaro Arms, MD;  Location: [facility];  Service: Gynecology;  Laterality: N/A;   DILATION AND CURETTAGE OF UTERUS N/A 11/22/2012   Procedure: SUCTION DILATATION AND CURETTAGE;  Surgeon: Lazaro Arms, MD;  Location: [facility];  Service: Gynecology;  Laterality: N/A;    Allergies  Allergies  Allergen Reactions   Vicodin [Hydrocodone-Acetaminophen] Other (See Comments)    violent   Percocet [Oxycodone-Acetaminophen]     Violent behaviors   Latex Itching and Rash    History of Present Illness    Ms. Lundquist is PMH of diabetes, iron deficiency anemia, obesity, hyperlipidemia, and chest pain.  She presented to the emergency department on 10/14/2019 with complaints of chest pain.  She described the pain as substernal, aching/pressure, nonradiating, moderate, sudden onset, it was relieved on its own with no medication.  Her CT scan was negative for PE and no other acute abnormalities.  Blood pressure 112/70, EKG sinus tachycardia with nonspecific intraventricular conduction block wide QRS LVH repolarization abnormality.  High-sensitivity troponins less than 2.  She was having episodes of nausea and  vomiting that was followed by GI.  She was taken Zofran for this.  It was felt that her Zofran was potentially prolonging her QTC.  Her echocardiogram showed LVEF of 50-55%, left regional wall abnormalities, and no valvular abnormalities.  She presented to the clinic 12/07/2019 for follow-up evaluation and stated she had continued to have intermittent episodes of chest discomfort since being in the emergency department.  She stated that she  had approximately 5-6 episodes.  These episodes would last for 15 to 20 minutes and up to 1 hour.  They were relieved by rest.  She considered them to be sharp in nature.  She stated that she was somewhat physically active exercising 2-3 times per week for up to 1 hour.  She had been seen by GI who were evaluating her for her severe reflux and nauseousness.  Case was reviewed with Dr. Rennis Golden and due to her reassuring echocardiogram we did not pursue ischemic evaluation.  I  prescribed her metoprolol tartrate 12.5 mg twice daily, order TSH, T3 and T4, and planned her follow-up for 1 month for further evaluation.    It was recommended she proceed with further GI work-up.  She presents to the clinic today for follow-up evaluation and states she feels well.  Her blood pressure and heart rate have been well controlled with 12.5 metoprolol twice daily.  Her heart rate has been ranging in the 70s-80s and her blood pressure has been 120s-110s over 70-80s.  She indicates that she  has had a couple instances where she feels somewhat lethargic and has low blood pressure.  We discussed taking her blood pressure prior to taking her medicine.  She expressed understanding.  We will have her continue to take her blood pressure 2 times per week or if she feels like she has low energy.  She continues to be physically active and low-salt diet.  I have her follow-up in 3 months for reevaluation . Today she denies chest pain, shortness of breath, lower extremity edema, fatigue, palpitations,  melena, hematuria, hemoptysis, diaphoresis, weakness, presyncope, syncope, orthopnea, and PND.  Home Medications    Prior to Admission medications   Medication Sig Start Date End Date Taking? Authorizing Provider  levonorgestrel (MIRENA) 20 MCG/24HR IUD 1 each by Intrauterine route once.    [provider]  metoprolol tartrate (LOPRESSOR) 25 MG tablet Take 0.5 tablets (12.5 mg total) by mouth 2 (two) times daily. 12/07/19   Azalee Course, PA    Family History    Family History  Problem Relation Age of Onset   Prostate cancer Paternal Grandfather    Breast cancer Paternal Grandmother    Lung cancer Paternal Grandmother    Lung cancer Maternal Grandfather    Colon polyps Father    Prostate cancer Father    Healthy Brother    Healthy Daughter    Healthy Son    Breast cancer Maternal Aunt        Multiple with Breast Cancer   Cervical cancer Maternal Aunt    Stroke Maternal Aunt    Hypertension Other    Diabetes Other    Other Other        stomach tumors   Heart disease Other        her mom, gma and aunts have arrythmia   Kidney disease Paternal Aunt    Colon cancer Paternal Aunt        colon problems   Stroke Paternal Uncle        PGreatUncle   Prostate cancer Paternal Uncle    Breast cancer Cousin    Esophageal cancer Neg Hx    Rectal cancer Neg Hx    Stomach cancer Neg Hx    She indicated that her mother is alive. She indicated that her father is alive. She indicated that her brother is alive. She indicated that her maternal grandmother is alive. She indicated that her maternal grandfather is deceased. She indicated that her paternal grandmother is deceased. She indicated that her paternal grandfather is deceased. She indicated that her daughter is alive. She indicated that her son is alive. She indicated that the status of her maternal aunt is unknown. She indicated that the status of her paternal aunt is unknown. She indicated that the status of  her paternal uncle is unknown. She indicated that the status of her cousin is unknown. She indicated that the status of her neg hx is unknown. She indicated that the status of her other is unknown.  Social History    Social History   Socioeconomic History   Marital status: Single    Spouse name: Not on file   Number of children: 2   Years of education: Not on file   Highest education level: Some college, no degree  Occupational History   Occupation: CNA    Comment: [employer]  Tobacco Use   Smoking status: Never Smoker   Smokeless tobacco: Never Used  Building services engineer Use: Never used  Substance and Sexual  Activity   Alcohol use: Not Currently    Comment: socially    Drug use: No   Sexual activity: Yes    Birth control/protection: I.U.D.  Other Topics Concern   Not on file  Social History Narrative   Not on file   Social Determinants of Health   Financial Resource Strain:    Difficulty of Paying Living Expenses: Not on file  Food Insecurity:    Worried About Running Out of Food in the Last Year: Not on file   Ran Out of Food in the Last Year: Not on file  Transportation Needs:    Lack of Transportation (Medical): Not on file   Lack of Transportation (Non-Medical): Not on file  Physical Activity:    Days of Exercise per Week: Not on file   Minutes of Exercise per Session: Not on file  Stress:    Feeling of Stress : Not on file  Social Connections:    Frequency of Communication with Friends and Family: Not on file   Frequency of Social Gatherings with Friends and Family: Not on file   Attends Religious Services: Not on file   Active Member of Clubs or Organizations: Not on file   Attends BankerClub or Organization Meetings: Not on file   Marital Status: Not on file  Intimate Partner Violence:    Fear of Current or Ex-Partner: Not on file   Emotionally Abused: Not on file   Physically Abused: Not on file   Sexually Abused: Not on  file     Review of Systems    General:  No chills, fever, night sweats or weight changes.  Cardiovascular:  No chest pain, dyspnea on exertion, edema, orthopnea, palpitations, paroxysmal nocturnal dyspnea. Dermatological: No rash, lesions/masses Respiratory: No cough, dyspnea Urologic: No hematuria, dysuria Abdominal:   No nausea, vomiting, diarrhea, bright red blood per rectum, melena, or hematemesis Neurologic:  No visual changes, wkns, changes in mental status. All other systems reviewed and are otherwise negative except as noted above.  Physical Exam    VS:  BP 98/82    Pulse 100    Temp 98 F (36.7 C)    SpO2 98%  , BMI There is no height or weight on file to calculate BMI. GEN: Well nourished, well developed, in no acute distress. HEENT: normal. Neck: Supple, no JVD, carotid bruits, or masses. Cardiac: RRR, no murmurs, rubs, or gallops. No clubbing, cyanosis, edema.  Radials/DP/PT 2+ and equal bilaterally.  Respiratory:  Respirations regular and unlabored, clear to auscultation bilaterally. GI: Soft, nontender, nondistended, BS + x 4. MS: no deformity or atrophy. Skin: warm and dry, no rash. Neuro:  Strength and sensation are intact. Psych: Normal affect.  Accessory Clinical Findings    Recent Labs: 10/14/2019: ALT 27; B Natriuretic Peptide 24.4; Hemoglobin 13.5; Magnesium 1.9; Platelets 263; Potassium 4.0; Sodium 137 11/30/2019: BUN 14; Creatinine, Ser 1.02 12/07/2019: TSH 0.998   Recent Lipid Panel    Component Value Date/Time   CHOL 196 10/22/2018 1140   TRIG 32 10/22/2018 1140   HDL 65 10/22/2018 1140   CHOLHDL 3.0 10/22/2018 1140   LDLCALC 121 (H) 10/22/2018 1140   LDLDIRECT 112 (H) 09/10/2011 0952    ECG personally reviewed by me today-none today.- No acute changes  EKG 11/29/2019 sinus tachycardia nonspecific intraventricular block inferior infarct undetermined age 52 bpm- No acute changes  EKG 10/17/2019 Wide QRS rhythm nonspecific interventricular block  LVH with repolarization abnormality 120 bpm  Echocardiogram 11/02/2019 IMPRESSIONS  1. Left ventricular ejection fraction, by estimation, is 50 to 55%. The  left ventricle has low normal function. The left ventricle demonstrates  regional wall motion abnormalities (see scoring diagram/findings for  description). There is mild asymmetric  left ventricular hypertrophy of the inferolateral segment. Tissue Doppler  low for age, probable grossly normal diastolic function.  2. Right ventricular systolic function is normal. The right ventricular  size is normal. There is normal pulmonary artery systolic pressure. The  estimated right ventricular systolic pressure is 22.9 mmHg.  3. The mitral valve is normal in structure. Trivial mitral valve  regurgitation. No evidence of mitral stenosis.  4. The aortic valve is tricuspid. Aortic valve regurgitation is trivial.  No aortic stenosis is present.  5. The inferior vena cava is normal in size with greater than 50%  respiratory variability, suggesting right atrial pressure of 3 mmHg.   Conclusion(s)/Recommendation(s): Basal ventricular septal thinning and  hypokinesis. In setting of abnormal ECG, consider cardiac MRI to evaluate  for granulomatous process.  Assessment & Plan   1.  Atypical chest pain/precordial pain -no chest pain today.    Continues to have brief/occasional episodes of occasional sharp chest discomfort that comes on at rest and with activity, lasting for minutes  at that time.    Her chest discomfort dissipates  without intervention.   Heart healthy low-sodium diet-salty 6 given/GERD diet Increase physical activity as tolerated Continue metoprolol tartrate 12.5 mg twice daily  Sinus tachycardia-heart rate today 100 bpm.  QTc decreased to 525 and PR interval 120.  Patient states that she has had increased heart rate for quite some time.  Thyroid panel normal.  Heart rate at home ranging in 70s-80s. Avoid stimulants,  caffeine, chocolate, EtOH etc. Heart healthy low-sodium diet Maintain physical activity Continue metoprolol 12.5 mg twice daily   Vomiting/GERD-continues with nausea and reflux.  Gastric emptying normal.  Still concern for antiemetics given prolonged QT.  Felt that antiemetics were not beneficial given less nausea and reflux symptoms.  Discussed surgical solutions/gastric bypass. Followed by GI  Disposition: Follow-up with Dr. Rennis Golden or me in 3 month.   Thomasene Ripple. Aaryana Betke NP-C    01/17/2020, 3:11 PM [facility] [address] Office [phone] Fax [phone]  Notice: This dictation was prepared with Dragon dictation along with smaller phrase technology. Any transcriptional errors that result from this process are unintentional and may not be corrected upon review.

## 2020-01-30 ENCOUNTER — Other Ambulatory Visit: Payer: Self-pay | Admitting: Internal Medicine

## 2020-01-30 MED ORDER — METOPROLOL TARTRATE 25 MG PO TABS: 12.5000 mg | ORAL_TABLET | Freq: Two times a day (BID) | ORAL | 6 refills | Status: DC

## 2020-01-30 NOTE — Telephone Encounter (Signed)
Per JC, FNP-C: Ronney Asters, NP  11:41 AM Please contact Ms. Labine and let her know that she may take an extra 12.5 mg of metoprolol once daily for heart rate greater than 110 if her blood pressure is greater than 100 over 70. Please ask her to continue to make sure she stays well-hydrated and avoid triggers for accelerated heart rate such as caffeine, chocolate, EtOH etc.   Thank you    Thomasene Ripple. Cleaver NP-C  01/30/2020, 11:40 AM  [facility]  [address]  Office [phone] Fax [phone]   Returned call to pt , relayed message above to pt. Verbalizes understanding. She will CB with any other issues.

## 2020-01-30 NOTE — Telephone Encounter (Signed)
*  STAT* If patient is at the pharmacy, call can be transferred to refill team.   1. Which medications need to be refilled? (please list name of each medication and dose if known) metoprolol tartrate (LOPRESSOR) 25 MG tablet  2. Which pharmacy/location (including street and city if local pharmacy) is medication to be sent to? [facility]  3. Do they need a 30 day or 90 day supply? 30 day   Prescription was sent to the wrong pharmacy

## 2020-02-23 ENCOUNTER — Encounter (INDEPENDENT_AMBULATORY_CARE_PROVIDER_SITE_OTHER): Payer: Self-pay | Admitting: Family Medicine

## 2020-02-23 ENCOUNTER — Other Ambulatory Visit: Payer: Self-pay

## 2020-02-23 ENCOUNTER — Ambulatory Visit (INDEPENDENT_AMBULATORY_CARE_PROVIDER_SITE_OTHER): Payer: No Typology Code available for payment source | Admitting: Family Medicine

## 2020-02-23 VITALS — BP 103/68 | HR 80 | Temp 98.1°F | Ht 62.0 in | Wt 201.0 lb

## 2020-02-23 DIAGNOSIS — Z1331 Encounter for screening for depression: Secondary | ICD-10-CM | POA: Diagnosis not present

## 2020-02-23 DIAGNOSIS — Z0289 Encounter for other administrative examinations: Secondary | ICD-10-CM

## 2020-02-23 DIAGNOSIS — K219 Gastro-esophageal reflux disease without esophagitis: Secondary | ICD-10-CM | POA: Diagnosis not present

## 2020-02-23 DIAGNOSIS — R0602 Shortness of breath: Secondary | ICD-10-CM | POA: Diagnosis not present

## 2020-02-23 DIAGNOSIS — R5383 Other fatigue: Secondary | ICD-10-CM | POA: Diagnosis not present

## 2020-02-23 DIAGNOSIS — Z6836 Body mass index (BMI) 36.0-36.9, adult: Secondary | ICD-10-CM

## 2020-02-23 DIAGNOSIS — Z9189 Other specified personal risk factors, not elsewhere classified: Secondary | ICD-10-CM | POA: Diagnosis not present

## 2020-02-23 DIAGNOSIS — R739 Hyperglycemia, unspecified: Secondary | ICD-10-CM | POA: Diagnosis not present

## 2020-02-24 LAB — CBC WITH DIFFERENTIAL/PLATELET
Basophils Absolute: 0 10*3/uL (ref 0.0–0.2)
Basos: 1 %
EOS (ABSOLUTE): 0.2 10*3/uL (ref 0.0–0.4)
Eos: 4 %
Hematocrit: 39.2 % (ref 34.0–46.6)
Hemoglobin: 12.6 g/dL (ref 11.1–15.9)
Immature Grans (Abs): 0 10*3/uL (ref 0.0–0.1)
Immature Granulocytes: 0 %
Lymphocytes Absolute: 2.3 10*3/uL (ref 0.7–3.1)
Lymphs: 46 %
MCH: 28.1 pg (ref 26.6–33.0)
MCHC: 32.1 g/dL (ref 31.5–35.7)
MCV: 88 fL (ref 79–97)
Monocytes Absolute: 0.4 10*3/uL (ref 0.1–0.9)
Monocytes: 9 %
Neutrophils Absolute: 2 10*3/uL (ref 1.4–7.0)
Neutrophils: 40 %
Platelets: 238 10*3/uL (ref 150–450)
RBC: 4.48 x10E6/uL (ref 3.77–5.28)
RDW: 13.4 % (ref 11.7–15.4)
WBC: 5 10*3/uL (ref 3.4–10.8)

## 2020-02-24 LAB — LIPID PANEL WITH LDL/HDL RATIO
Cholesterol, Total: 206 mg/dL — ABNORMAL HIGH (ref 100–199)
HDL: 56 mg/dL (ref >39)
LDL Chol Calc (NIH): 142 mg/dL — ABNORMAL HIGH (ref 0–99)
LDL/HDL Ratio: 2.5 ratio (ref 0.0–3.2)
Triglycerides: 43 mg/dL (ref 0–149)
VLDL Cholesterol Cal: 8 mg/dL (ref 5–40)

## 2020-02-24 LAB — HEMOGLOBIN A1C
Est. average glucose Bld gHb Est-mCnc: 117 mg/dL
Hgb A1c MFr Bld: 5.7 % — ABNORMAL HIGH (ref 4.8–5.6)

## 2020-02-24 LAB — VITAMIN B12: Vitamin B-12: 435 pg/mL (ref 232–1245)

## 2020-02-24 LAB — VITAMIN D 25 HYDROXY (VIT D DEFICIENCY, FRACTURES): Vit D, 25-Hydroxy: 9.1 ng/mL — ABNORMAL LOW (ref 30.0–100.0)

## 2020-02-24 LAB — FOLATE: Folate: 6.2 ng/mL (ref >3.0)

## 2020-02-24 LAB — INSULIN, RANDOM: INSULIN: 10.8 u[IU]/mL (ref 2.6–24.9)

## 2020-03-07 NOTE — Progress Notes (Signed)
Dear Candace [name] Candace [name], III MD,   Thank you for referring Candace [name] to our clinic. The following note includes my evaluation and treatment recommendations.  Chief Complaint:   OBESITY Candace [name] (MR# [id number]) is a 34 y.o. female who presents for evaluation and treatment of obesity and related comorbidities. Current BMI is Body mass index is 36.76 kg/m. Candace [name] has been struggling with her weight for many years and has been unsuccessful in either losing weight, maintaining weight loss, or reaching her healthy weight goal.  Nick is currently in the action stage of change and ready to dedicate time achieving and maintaining a healthier weight. Candace [name] is interested in becoming our patient and working on intensive lifestyle modifications including (but not limited to) diet and exercise for weight loss.  Candace [name]'s habits were reviewed today and are as follows: Her family eats meals together, she thinks her family will eat healthier with her, her desired weight loss is 15 lbs, she has been heavy most of her life, she started gaining weight while pregnant, her heaviest weight ever was 230 pounds, she is a picky eater and doesn't like to eat healthier foods and she skips meals frequently.  Depression Screen Candace [name]'s Food and Mood (modified PHQ-9) score was 0.  Depression screen PHQ 2/9 02/23/2020  Decreased Interest 0  Down, Depressed, Hopeless 0  PHQ - 2 Score 0  Altered sleeping 0  Tired, decreased energy 0  Change in appetite 0  Feeling bad or failure about yourself  0  Trouble concentrating 0  Moving slowly or fidgety/restless 0  Suicidal thoughts 0  PHQ-9 Score 0  Difficult doing work/chores -   Subjective:   1. Other fatigue [name] admits to daytime somnolence and denies waking up still tired. Patent has a history of symptoms of daytime fatigue. [name] generally gets 5 or 6 hours of sleep per night, and states that she has generally restful sleep.  Snoring is present. Apneic episodes are not present. Epworth Sleepiness Score is 4.  2. Shortness of breath on exertion Candace [name] notes increasing shortness of breath with exercising and seems to be worsening over time with weight gain. She notes getting out of breath sooner with activity than she used to. This has not gotten worse recently. Candace [name] denies shortness of breath at rest or orthopnea.  3. Hyperglycemia [name] has a history of mildly elevated glucose over the Summer. She has no recent A1c.  4. Gastroesophageal reflux disease Candace [name] has GERD with vomiting. She is followed by GI, and she is considering having a fundoplication but she needs her BMI below 35.  5. At risk for heart disease Candace [name] is at a higher than average risk for cardiovascular disease due to obesity.   Assessment/Plan:   1. Other fatigue Anabia does feel that her weight is causing her energy to be lower than it should be. Fatigue may be related to obesity, depression or many other causes. Labs will be ordered, and in the meanwhile, Candace [name] will focus on self care including making healthy food choices, increasing physical activity and focusing on stress reduction.  - EKG 12-Lead - VITAMIN D 25 Hydroxy (Vit-D Deficiency, Fractures) - Lipid Panel With LDL/HDL Ratio - Insulin, random - Vitamin B12 - Folate - Hemoglobin A1c  2. Shortness of breath on exertion Candace [name] does feel that she gets out of breath more easily that she used to when she exercises. [name]'s shortness of breath appears to be obesity related and exercise induced. She has  agreed to work on weight loss and gradually increase exercise to treat her exercise induced shortness of breath. Will continue to monitor closely.  - CBC with Differential/Platelet - COMPLETE METABOLIC PANEL WITH GFR  3. Hyperglycemia Fasting labs will be obtained today, and results with be discussed with Candace [name] in 2 weeks at her follow up visit. In the  meanwhile Candace [name] will start her Category 2 plan and will work on weight loss efforts.  4. Gastroesophageal reflux disease Intensive lifestyle modifications are the first line treatment for this issue. We discussed several lifestyle modifications today. Candace [name] will start her Category 2 plan, and will work on exercise and weight loss efforts. Orders and follow up as documented in patient record.   5. Depression screening [name] had a negative depression screening. Depression is commonly associated with obesity and often results in emotional eating behaviors. We will monitor this closely and work on CBT to help improve the non-hunger eating patterns. Referral to Psychology may be required if no improvement is seen as she continues in our clinic.  6. At risk for heart disease Candace [name] was given approximately 15 minutes of coronary artery disease prevention counseling today. She is 34 y.o. female and has risk factors for heart disease including obesity. We discussed intensive lifestyle modifications today with an emphasis on specific weight loss instructions and strategies.   Repetitive spaced learning was employed today to elicit superior memory formation and behavioral change.  7. Class 2 severe obesity with serious comorbidity and body mass index (BMI) of 36.0 to 36.9 in adult, unspecified obesity type Candace [name]) Candace [name] is currently in the action stage of change and her goal is to continue with weight loss efforts. I recommend Candace [name] begin the structured treatment plan as follows:  She has agreed to the Category 2 Plan.  Exercise goals: No exercise has been prescribed for now, while we concentrate on nutritional changes.   Behavioral modification strategies: no skipping meals.  She was informed of the importance of frequent follow-up visits to maximize her success with intensive lifestyle modifications for her multiple health conditions. She was informed we would discuss her lab results at  her next visit unless there is a critical issue that needs to be addressed sooner. Candace [name] agreed to keep her next visit at the agreed upon time to discuss these results.  Objective:   Blood pressure 103/68, pulse 80, temperature 98.1 F (36.7 C), height 5\' 2"  (1.575 m), weight 201 lb (91.2 kg), SpO2 100 %. Body mass index is 36.76 kg/m.  EKG: Normal sinus rhythm, rate 115 BPM.  Indirect Calorimeter completed today shows a VO2 of 236 and a REE of 1649.  Her calculated basal metabolic rate is thus her basal metabolic rate is better than expected.  General: Cooperative, alert, well developed, in no acute distress. HEENT: Conjunctivae and lids unremarkable. Cardiovascular: Regular rhythm.  Lungs: Normal work of breathing. Neurologic: No focal deficits.   Lab Results  Component Value Date   CREATININE 1.02 11/30/2019   BUN 14 11/30/2019   NA 137 10/14/2019   K 4.0 10/14/2019   CL 105 10/14/2019   CO2 21 (L) 10/14/2019   Lab Results  Component Value Date   ALT 27 10/14/2019   AST 21 10/14/2019   ALKPHOS 62 10/14/2019   BILITOT 1.1 10/14/2019   Lab Results  Component Value Date   HGBA1C 5.7 (H) 02/23/2020   HGBA1C 5.6 04/05/2012   Lab Results  Component Value Date   INSULIN 10.8 02/23/2020  Lab Results  Component Value Date   TSH 0.998 12/07/2019   Lab Results  Component Value Date   CHOL 206 (H) 02/23/2020   HDL 56 02/23/2020   LDLCALC 142 (H) 02/23/2020   LDLDIRECT 112 (H) 09/10/2011   TRIG 43 02/23/2020   CHOLHDL 3.0 10/22/2018   Lab Results  Component Value Date   WBC 5.0 02/23/2020   HGB 12.6 02/23/2020   HCT 39.2 02/23/2020   MCV 88 02/23/2020   PLT 238 02/23/2020   Lab Results  Component Value Date   IRON 44 09/10/2011   TIBC 391 09/10/2011   Attestation Statements:   Reviewed by clinician on day of visit: allergies, medications, problem list, medical history, surgical history, family history, social history, and previous encounter  notes.   I, Burt Knack, am acting as transcriptionist for Quillian Quince, MD.  I have reviewed the above documentation for accuracy and completeness, and I agree with the above. - Quillian Quince, MD

## 2020-03-08 ENCOUNTER — Encounter (INDEPENDENT_AMBULATORY_CARE_PROVIDER_SITE_OTHER): Payer: Self-pay | Admitting: Family Medicine

## 2020-03-08 ENCOUNTER — Other Ambulatory Visit: Payer: Self-pay | Admitting: Internal Medicine

## 2020-03-08 ENCOUNTER — Ambulatory Visit (INDEPENDENT_AMBULATORY_CARE_PROVIDER_SITE_OTHER): Payer: No Typology Code available for payment source | Admitting: Family Medicine

## 2020-03-08 ENCOUNTER — Other Ambulatory Visit: Payer: Self-pay | Admitting: Physician Assistant

## 2020-03-08 ENCOUNTER — Other Ambulatory Visit: Payer: Self-pay

## 2020-03-08 VITALS — BP 101/66 | HR 69 | Temp 98.5°F | Ht 62.0 in | Wt 199.0 lb

## 2020-03-08 DIAGNOSIS — Z6836 Body mass index (BMI) 36.0-36.9, adult: Secondary | ICD-10-CM

## 2020-03-08 DIAGNOSIS — E559 Vitamin D deficiency, unspecified: Secondary | ICD-10-CM | POA: Diagnosis not present

## 2020-03-08 DIAGNOSIS — R7303 Prediabetes: Secondary | ICD-10-CM | POA: Diagnosis not present

## 2020-03-08 DIAGNOSIS — Z9189 Other specified personal risk factors, not elsewhere classified: Secondary | ICD-10-CM | POA: Diagnosis not present

## 2020-03-08 DIAGNOSIS — E7849 Other hyperlipidemia: Secondary | ICD-10-CM

## 2020-03-08 MED ORDER — METFORMIN HCL 500 MG PO TABS: 500.0000 mg | ORAL_TABLET | Freq: Every day | ORAL | 0 refills | Status: DC

## 2020-03-08 MED ORDER — ERGOCALCIFEROL 1.25 MG (50000 UT) PO CAPS: 50000.0000 [IU] | ORAL_CAPSULE | ORAL | 0 refills | Status: DC

## 2020-03-08 MED FILL — METFORMIN HCL 500 MG TABS: 500 | 30 days supply | Qty: 30 | Fill #0

## 2020-03-08 MED FILL — VIT D2 1.25 MG (50,000 UNIT: 1.25 MG | 28 days supply | Qty: 4 | Fill #0

## 2020-03-08 MED FILL — METOPROLOL TARTRATE 25 MG T: 25 | 90 days supply | Qty: 90 | Fill #0

## 2020-03-12 NOTE — Progress Notes (Signed)
Chief Complaint:   OBESITY Candace [name] is here to discuss her progress with her obesity treatment plan along with follow-up of her obesity related diagnoses. Candace [name] is on the Category 2 Plan and states she is following her eating plan approximately 90% of the time. Candace [name] states she is walking for 30 minutes 1 time per week.  Today's visit was #: 2 Starting weight: 201 lbs Starting date: 02/23/2020 Today's weight: 199 lbs Today's date: 03/08/2020 Total lbs lost to date: 2 Total lbs lost since last in-office visit: 2  Interim History: Candace [name] has done well with weight loss, but missed mayo on her sandwich. She thought her hunger was mostly controlled and has done well avoiding sweets.  Subjective:   1. Other hyperlipidemia Candace [name]'s LDL is elevated but triglycerides and HDL are within normal limits. She is not on statin. I discussed labs with the patient today.  2. Vitamin D deficiency Candace [name]'s Vit D level is very low, and she notes mild fatigue. I discussed labs with the patient today.  3. Pre-diabetes Candace [name]'s A1c and insulin are elevated. She is working on diet and weight loss. She notes polyphagia. I discussed labs with the patient today.  4. At risk for diabetes mellitus Candace [name] is at higher than average risk for developing diabetes due to her obesity.   Assessment/Plan:   1. Other hyperlipidemia Cardiovascular risk and specific lipid/LDL goals reviewed. We discussed several lifestyle modifications today. Candace [name] will continue to work on diet, exercise and weight loss efforts. We will recheck labs in 3 months. Orders and follow up as documented in patient record.   2. Vitamin D deficiency Low Vitamin D level contributes to fatigue and are associated with obesity, breast, and colon cancer. Candace [name] agreed to start prescription Vitamin D 50,000 IU every week with no refills. She will follow-up for routine testing of Vitamin D, at least 2-3 times per year to avoid  over-replacement.  - ergocalciferol (VITAMIN D2) 1.25 MG (50000 UT) capsule; Take 1 capsule (50,000 Units total) by mouth once a week.  Dispense: 4 capsule; Refill: 0  3. Pre-diabetes Candace [name] will continue to work on weight loss, exercise, and decreasing simple carbohydrates to help decrease the risk of diabetes. Candace [name] agreed to start metformin 500 mg q AM with no refills.  - metFORMIN (GLUCOPHAGE) 500 MG tablet; Take 1 tablet (500 mg total) by mouth daily with breakfast.  Dispense: 30 tablet; Refill: 0  4. At risk for diabetes mellitus Candace [name] was given approximately 30 minutes of diabetes education and counseling today. We discussed intensive lifestyle modifications today with an emphasis on weight loss as well as increasing exercise and decreasing simple carbohydrates in her diet. We also reviewed medication options with an emphasis on risk versus benefit of those discussed.   Repetitive spaced learning was employed today to elicit superior memory formation and behavioral change.  5. Class 2 severe obesity with serious comorbidity and body mass index (BMI) of 36.0 to 36.9 in adult, unspecified obesity type Candace [name]) Candace [name] is currently in the action stage of change. As such, her goal is to continue with weight loss efforts. She has agreed to the Category 2 Plan.   Smart fruit options, Pre-diabetes, and metformin handouts were given today.  Exercise goals: As is.  Behavioral modification strategies: increasing lean protein intake and better snacking choices.  Candace [name] has agreed to follow-up with our clinic in 2 weeks. She was informed of the importance of frequent follow-up visits to maximize her success with intensive lifestyle  modifications for her multiple health conditions.   Objective:   Blood pressure 101/66, pulse 69, temperature 98.5 F (36.9 C), height 5\' 2"  (1.575 m), weight 199 lb (90.3 kg), SpO2 99 %. Body mass index is 36.4 kg/m.  General: Cooperative, alert, well  developed, in no acute distress. HEENT: Conjunctivae and lids unremarkable. Cardiovascular: Regular rhythm.  Lungs: Normal work of breathing. Neurologic: No focal deficits.   Lab Results  Component Value Date   CREATININE 1.02 11/30/2019   BUN 14 11/30/2019   NA 137 10/14/2019   K 4.0 10/14/2019   CL 105 10/14/2019   CO2 21 (L) 10/14/2019   Lab Results  Component Value Date   ALT 27 10/14/2019   AST 21 10/14/2019   ALKPHOS 62 10/14/2019   BILITOT 1.1 10/14/2019   Lab Results  Component Value Date   HGBA1C 5.7 (H) 02/23/2020   HGBA1C 5.6 04/05/2012   Lab Results  Component Value Date   INSULIN 10.8 02/23/2020   Lab Results  Component Value Date   TSH 0.998 12/07/2019   Lab Results  Component Value Date   CHOL 206 (H) 02/23/2020   HDL 56 02/23/2020   LDLCALC 142 (H) 02/23/2020   LDLDIRECT 112 (H) 09/10/2011   TRIG 43 02/23/2020   CHOLHDL 3.0 10/22/2018   Lab Results  Component Value Date   WBC 5.0 02/23/2020   HGB 12.6 02/23/2020   HCT 39.2 02/23/2020   MCV 88 02/23/2020   PLT 238 02/23/2020   Lab Results  Component Value Date   IRON 44 09/10/2011   TIBC 391 09/10/2011   Attestation Statements:   Reviewed by clinician on day of visit: allergies, medications, problem list, medical history, surgical history, family history, social history, and previous encounter notes.   I, 11/10/2011, am acting as transcriptionist for Burt Knack, MD.  I have reviewed the above documentation for accuracy and completeness, and I agree with the above. -  Quillian Quince, MD

## 2020-03-28 ENCOUNTER — Other Ambulatory Visit: Payer: Self-pay

## 2020-03-28 ENCOUNTER — Ambulatory Visit (INDEPENDENT_AMBULATORY_CARE_PROVIDER_SITE_OTHER): Payer: No Typology Code available for payment source | Admitting: Family Medicine

## 2020-03-28 ENCOUNTER — Encounter (INDEPENDENT_AMBULATORY_CARE_PROVIDER_SITE_OTHER): Payer: Self-pay | Admitting: Family Medicine

## 2020-03-28 VITALS — BP 102/71 | HR 66 | Temp 98.0°F | Ht 62.0 in | Wt 195.0 lb

## 2020-03-28 DIAGNOSIS — Z6835 Body mass index (BMI) 35.0-35.9, adult: Secondary | ICD-10-CM

## 2020-03-28 DIAGNOSIS — Z9189 Other specified personal risk factors, not elsewhere classified: Secondary | ICD-10-CM | POA: Diagnosis not present

## 2020-03-28 DIAGNOSIS — R7303 Prediabetes: Secondary | ICD-10-CM | POA: Diagnosis not present

## 2020-03-29 ENCOUNTER — Encounter (INDEPENDENT_AMBULATORY_CARE_PROVIDER_SITE_OTHER): Payer: Self-pay | Admitting: Family Medicine

## 2020-03-29 NOTE — Telephone Encounter (Signed)
Dr Beasley pt 

## 2020-03-29 NOTE — Progress Notes (Signed)
Chief Complaint:   OBESITY Candace [name] is here to discuss her progress with her obesity treatment plan along with follow-up of her obesity related diagnoses. Candace [name] is on the Category 2 Plan and states she is following her eating plan approximately 75% of the time. Candace [name] states she is walking for 30 minutes 2 times per week.  Today's visit was #: 3 Starting weight: 201 lbs Starting date: 02/23/2020 Today's weight: 194 lbs Today's date: 03/28/2020 Total lbs lost to date: 7 Total lbs lost since last in-office visit: 5  Interim History: Candace [name] has done well with weight loss on her Category 2 plan. She has morning nausea and is unable to eat breakfast which can eventually decrease her RMR and stall weight loss efforts. She is open to discussing Thanksgiving eating strategies.  Subjective:   1. Pre-diabetes Candace [name] was given a script for metformin but has decided to not take it for now. Instead she is concentrating on her eating and weight loss to help treat her pre-diabetes.  2. At risk for impaired metabolic function Candace [name] is at increased risk for impaired metabolic function due to decreased protein in the morning.  Assessment/Plan:   1. Pre-diabetes Candace [name] agreed to discontinue metformin for now, and will continue to work on weight loss, diet, exercise, and decreasing simple carbohydrates to help decrease the risk of diabetes. We will continue to monitor closely.  2. At risk for impaired metabolic function Candace [name] was given approximately 15 minutes of impaired  metabolic function prevention counseling today. We discussed intensive lifestyle modifications today with an emphasis on specific nutrition and exercise instructions and strategies.   Repetitive spaced learning was employed today to elicit superior memory formation and behavioral change.  3. Class 2 severe obesity with serious comorbidity and body mass index (BMI) of 35.0 to 35.9 in adult, unspecified obesity  type [facility]) Candace [name] is currently in the action stage of change. As such, her goal is to continue with weight loss efforts. She has agreed to the Category 2 Plan with lunch options, and protein drink for breakfast.   Exercise goals: As is.  Behavioral modification strategies: increasing lean protein intake, meal planning and cooking strategies and holiday eating strategies .  Candace [name] has agreed to follow-up with our clinic in 3 weeks. She was informed of the importance of frequent follow-up visits to maximize her success with intensive lifestyle modifications for her multiple health conditions.   Objective:   Blood pressure 102/71, pulse 66, temperature 98 F (36.7 C), height 5\' 2"  (1.575 m), weight 195 lb (88.5 kg), SpO2 100 %. Body mass index is 35.67 kg/m.  General: Cooperative, alert, well developed, in no acute distress. HEENT: Conjunctivae and lids unremarkable. Cardiovascular: Regular rhythm.  Lungs: Normal work of breathing. Neurologic: No focal deficits.   Lab Results  Component Value Date   CREATININE 1.02 11/30/2019   BUN 14 11/30/2019   NA 137 10/14/2019   K 4.0 10/14/2019   CL 105 10/14/2019   CO2 21 (L) 10/14/2019   Lab Results  Component Value Date   ALT 27 10/14/2019   AST 21 10/14/2019   ALKPHOS 62 10/14/2019   BILITOT 1.1 10/14/2019   Lab Results  Component Value Date   HGBA1C 5.7 (H) 02/23/2020   HGBA1C 5.6 04/05/2012   Lab Results  Component Value Date   INSULIN 10.8 02/23/2020   Lab Results  Component Value Date   TSH 0.998 12/07/2019   Lab Results  Component Value Date   CHOL 206 (  H) 02/23/2020   HDL 56 02/23/2020   LDLCALC 142 (H) 02/23/2020   LDLDIRECT 112 (H) 09/10/2011   TRIG 43 02/23/2020   CHOLHDL 3.0 10/22/2018   Lab Results  Component Value Date   WBC 5.0 02/23/2020   HGB 12.6 02/23/2020   HCT 39.2 02/23/2020   MCV 88 02/23/2020   PLT 238 02/23/2020   Lab Results  Component Value Date   IRON 44 09/10/2011   TIBC 391  09/10/2011   Attestation Statements:   Reviewed by clinician on day of visit: allergies, medications, problem list, medical history, surgical history, family history, social history, and previous encounter notes.   I, Burt Knack, am acting as transcriptionist for Quillian Quince, MD.  I have reviewed the above documentation for accuracy and completeness, and I agree with the above. -  Quillian Quince, MD

## 2020-04-11 ENCOUNTER — Encounter (INDEPENDENT_AMBULATORY_CARE_PROVIDER_SITE_OTHER): Payer: Self-pay | Admitting: Family Medicine

## 2020-04-11 ENCOUNTER — Ambulatory Visit (INDEPENDENT_AMBULATORY_CARE_PROVIDER_SITE_OTHER): Payer: No Typology Code available for payment source | Admitting: Family Medicine

## 2020-04-11 ENCOUNTER — Other Ambulatory Visit: Payer: Self-pay

## 2020-04-11 ENCOUNTER — Other Ambulatory Visit (HOSPITAL_COMMUNITY): Payer: Self-pay | Admitting: Family Medicine

## 2020-04-11 VITALS — BP 101/67 | HR 65 | Temp 98.3°F | Ht 62.0 in | Wt 193.0 lb

## 2020-04-11 DIAGNOSIS — R000 Tachycardia, unspecified: Secondary | ICD-10-CM | POA: Diagnosis not present

## 2020-04-11 DIAGNOSIS — G4709 Other insomnia: Secondary | ICD-10-CM | POA: Diagnosis not present

## 2020-04-11 DIAGNOSIS — E559 Vitamin D deficiency, unspecified: Secondary | ICD-10-CM

## 2020-04-11 DIAGNOSIS — Z9189 Other specified personal risk factors, not elsewhere classified: Secondary | ICD-10-CM | POA: Diagnosis not present

## 2020-04-11 DIAGNOSIS — Z6835 Body mass index (BMI) 35.0-35.9, adult: Secondary | ICD-10-CM

## 2020-04-11 MED ORDER — ERGOCALCIFEROL 1.25 MG (50000 UT) PO CAPS: 50000.0000 [IU] | ORAL_CAPSULE | ORAL | 0 refills | Status: DC

## 2020-04-11 MED FILL — VIT D2 1.25 MG (50,000 UNIT: 1.25 MG | 28 days supply | Qty: 4 | Fill #0

## 2020-04-12 ENCOUNTER — Ambulatory Visit (INDEPENDENT_AMBULATORY_CARE_PROVIDER_SITE_OTHER): Payer: No Typology Code available for payment source | Admitting: Internal Medicine

## 2020-04-12 ENCOUNTER — Other Ambulatory Visit: Payer: Self-pay | Admitting: Internal Medicine

## 2020-04-12 ENCOUNTER — Encounter: Payer: Self-pay | Admitting: Internal Medicine

## 2020-04-12 VITALS — BP 112/82 | HR 66 | Ht 62.25 in | Wt 195.2 lb

## 2020-04-12 DIAGNOSIS — I447 Left bundle-branch block, unspecified: Secondary | ICD-10-CM

## 2020-04-12 DIAGNOSIS — R0789 Other chest pain: Secondary | ICD-10-CM | POA: Diagnosis not present

## 2020-04-12 DIAGNOSIS — R000 Tachycardia, unspecified: Secondary | ICD-10-CM

## 2020-04-12 MED ORDER — METOPROLOL SUCCINATE ER 25 MG PO TB24: 12.5000 mg | ORAL_TABLET | Freq: Every day | ORAL | 3 refills | Status: DC

## 2020-04-12 MED FILL — METOPROLOL SUCCINATE ER 25: 25 | 90 days supply | Qty: 45 | Fill #0

## 2020-04-12 NOTE — Progress Notes (Signed)
OFFICE NOTE  Chief Complaint:  Follow-up  Primary Care Physician: Salley Scarlet, MD  HPI:  Candace [name] is a 34 y.o. female with a past medial history significant for recent chest pain, borderline diabetes, GERD, and irregular heartbeat.  She was recently seen in the emergency department for chest pain.  She had been having nausea and vomiting and been undergoing a GI work-up.  She had troponins that were negative.  EKG showed an IVCD/left bundle branch block which seems to be unchanged compared to an EKG in 2014.  BNP was low and I did not recommend any further inpatient testing.  She did have an outpatient echo however which showed a reduced LVEF to 50 to 55%.  Asymmetric left ventricular hypertrophy of the inferolateral segment.  Additionally there was reported to be basal ventricular septal thinning and hypokinesis.  A cardiac MRI was recommended to evaluate for a ? Granulomatous process.  She was seen in the interim by Edd Fabian, NP who had noted that he felt she had atypical chest pain which seem to have resolved.  He had started her on metoprolol tartrate due to tachycardia at 12.5 mg twice daily.  Since then she says she has not felt well.  At times she gets low blood pressure with her elevated heart rate and is concerned about the medication.  She has seen Dr. Myrtie Neither with GI for her vomiting/GERD symptoms.  PMHx:  Past Medical History:  Diagnosis Date  . Allergy   . Anemia   . BV (bacterial vaginosis) 12/15/2012  . Chest pain   . Diabetes (HCC)    borderline DM per patient - denies DM  . Eczema   . GERD (gastroesophageal reflux disease)   . Headache(784.0)   . History of chlamydia   . History of gonorrhea   . Irregular heartbeat   . Shortness of breath   . Vaginal itching 12/15/2012  . Vaginal Pap smear, abnormal    f/u ok    Past Surgical History:  Procedure Laterality Date  . CERVICAL CONIZATION W/BX N/A 12/22/2018   Procedure: LASER ABLATION OF CERVIX;   Surgeon: Lazaro Arms, MD;  Location: [facility];  Service: Gynecology;  Laterality: N/A;  . DILATION AND CURETTAGE OF UTERUS N/A 11/22/2012   Procedure: SUCTION DILATATION AND CURETTAGE;  Surgeon: Lazaro Arms, MD;  Location: [facility];  Service: Gynecology;  Laterality: N/A;    FAMHx:  Family History  Problem Relation Age of Onset  . Prostate cancer Paternal Grandfather   . Breast cancer Paternal Grandmother   . Lung cancer Paternal Grandmother   . Lung cancer Maternal Grandfather   . Colon polyps Father   . Prostate cancer Father   . Healthy Brother   . Healthy Daughter   . Healthy Son   . Breast cancer Maternal Aunt        Multiple with Breast Cancer  . Cervical cancer Maternal Aunt   . Stroke Maternal Aunt   . Hypertension Other   . Diabetes Other   . Other Other        stomach tumors  . Heart disease Other        her mom, gma and aunts have arrythmia  . Kidney disease Paternal Aunt   . Colon cancer Paternal Aunt        colon problems  . Stroke Paternal Uncle        PGreatUncle  . Prostate cancer Paternal Uncle   . Breast cancer Cousin   .  Esophageal cancer Neg Hx   . Rectal cancer Neg Hx   . Stomach cancer Neg Hx     SOCHx:   reports that she has never smoked. She has never used smokeless tobacco. She reports previous alcohol use. She reports that she does not use drugs.  ALLERGIES:  Allergies  Allergen Reactions  . Vicodin [Hydrocodone-Acetaminophen] Other (See Comments)    violent  . Percocet [Oxycodone-Acetaminophen]     Violent behaviors  . Latex Itching and Rash    ROS: Pertinent items noted in HPI and remainder of comprehensive ROS otherwise negative.  HOME MEDS: Current Outpatient Medications on File Prior to Visit  Medication Sig Dispense Refill  . ergocalciferol (VITAMIN D2) 1.25 MG (50000 UT) capsule Take 1 capsule (50,000 Units total) by mouth once a week. 4 capsule 0  . levonorgestrel (MIRENA) 20 MCG/24HR IUD 1 each by Intrauterine route once.     . Melatonin 10 MG TABS Take 1 tablet by mouth at bedtime as needed.     No current facility-administered medications on file prior to visit.    LABS/IMAGING: No results found for this or any previous visit (from the past 48 hour(s)). No results found.  LIPID PANEL:    Component Value Date/Time   CHOL 206 (H) 02/23/2020 0919   TRIG 43 02/23/2020 0919   HDL 56 02/23/2020 0919   CHOLHDL 3.0 10/22/2018 1140   LDLCALC 142 (H) 02/23/2020 0919   LDLCALC 121 (H) 10/22/2018 1140   LDLDIRECT 112 (H) 09/10/2011 0952     WEIGHTS: Wt Readings from Last 3 Encounters:  04/12/20 195 lb 3.2 oz (88.5 kg)  04/11/20 193 lb (87.5 kg)  03/28/20 195 lb (88.5 kg)    VITALS: BP 112/82   Pulse 66   Ht 5' 2.25" (1.581 m)   Wt 195 lb 3.2 oz (88.5 kg)   BMI 35.42 kg/m   EXAM: General appearance: alert and no distress Neck: no carotid bruit, no JVD and thyroid not enlarged, symmetric, no tenderness/mass/nodules Lungs: clear to auscultation bilaterally Heart: regular rate and rhythm, S1, S2 normal, no murmur, click, rub or gallop Abdomen: soft, non-tender; bowel sounds normal; no masses,  no organomegaly Extremities: extremities normal, atraumatic, no cyanosis or edema Pulses: 2+ and symmetric Skin: Skin color, texture, turgor normal. No rashes or lesions Neurologic: Grossly normal Psych: Pleasant  EKG: Normal sinus rhythm with sinus arrhythmia at 66, LBBB (seen in 2014)- personally reviewed  ASSESSMENT: 1. Chest pain 2. Low normal LVEF 50 to 55% 3. LBBB 4. Recent vomiting/GERD 5. Sinus tachycardia  PLAN: 1.   [name] has had issues with tachycardia as well as issues with low blood pressure on metoprolol.  I advised decreasing that further and we will switch her to Toprol-XL 12.5 mg at night daily.  Her chest pain symptoms have improved somewhat but are still persistent.  She has a left bundle branch block.  This was seen in 2014 and likely does not represent ischemia.  She however  was noted to have some septal thinning which make me wonder whether she had a prior septal infarct at some point.  I like to get a Myoview stress test to evaluate and certainly if there is a fixed defect in this area it may explain the findings.  If there is reversible ischemia, however we would have to pursue that.  It would seem unlikely she had that at age 73 but possible.  Her tachycardia is of unknown etiology.  The beta-blocker has help with  this but she has had low blood pressure.  As above we will try lower dose of beta-blocker at night.  There was also a suggestion on her echo to evaluate for possible granulomatous disease.  I am not sure why this was suggested however it may be reasonable if her stress test is unrevealing.  Follow-up with me afterwards.  Chrystie Nose, MD, [facility], FACP  [place]  [facility]  Medical Director of the Advanced Lipid Disorders &  Cardiovascular Risk Reduction Clinic Diplomate of the American Board of Clinical Lipidology Attending Cardiologist  Direct Dial: [phone]  Fax: [phone]  Website:  [email or web address] 04/12/2020, 4:38 PM

## 2020-04-12 NOTE — Patient Instructions (Signed)
Medication Instructions:  STOP metoprolol tartrate START metoprolol succiante  *If you need a refill on your cardiac medications before your next appointment, please call your pharmacy*   Testing/Procedures: Dr. Rennis Golden has ordered a Lexiscan Myocardial Perfusion Imaging Study. This will be done at [address]  Please arrive 15 minutes prior to your appointment time for registration and insurance purposes.   The test will take approximately 3 to 4 hours to complete; you may bring reading material.  If someone comes with you to your appointment, they will need to remain in the main lobby due to limited space in the testing area. **If you are pregnant or breastfeeding, please notify the nuclear lab prior to your appointment**   How to prepare for your Myocardial Perfusion Test:  Do not eat or drink 3 hours prior to your test, except you may have water.  Do not consume products containing caffeine (regular or decaffeinated) 12 hours prior to your test. (ex: coffee, chocolate, sodas, tea).  Do wear comfortable clothes (no dresses or overalls) and walking shoes, tennis shoes preferred (No heels or open toe shoes are allowed).  Do NOT wear cologne, perfume, aftershave, or lotions (deodorant is allowed).  If you use an inhaler, use it the AM of your test and bring it with you.   If you use a nebulizer, use it the AM of your test.   If these instructions are not followed, your test will have to be rescheduled.    Follow-Up: At [facility], you and your health needs are our priority.  As part of our continuing mission to provide you with exceptional heart care, we have created designated Provider Care Teams.  These Care Teams include your primary Cardiologist (physician) and Advanced Practice Providers (APPs -  Physician Assistants and Nurse Practitioners) who all work together to provide you with the care you need, when you need it.  We recommend signing up for the  patient portal called "MyChart".  Sign up information is provided on this After Visit Summary.  MyChart is used to connect with patients for Virtual Visits (Telemedicine).  Patients are able to view lab/test results, encounter notes, upcoming appointments, etc.  Non-urgent messages can be sent to your provider as well.   To learn more about what you can do with MyChart, go to [email or web address].    Your next appointment:   3 month(s)  The format for your next appointment:   In Person  Provider:   You may see Chrystie Nose, MD or one of the following Advanced Practice Providers on your designated Care Team:    Azalee Course, PA-C  Micah Flesher, PA-C or   Judy Pimple, [place]    Other Instructions

## 2020-04-16 ENCOUNTER — Telehealth (HOSPITAL_COMMUNITY): Payer: Self-pay

## 2020-04-16 NOTE — Telephone Encounter (Signed)
Detailed instructions left on the patient's answering machine. Asked to call back with any questions. S.Hiliana Eilts EMTP 

## 2020-04-17 ENCOUNTER — Other Ambulatory Visit: Payer: Self-pay

## 2020-04-17 ENCOUNTER — Ambulatory Visit (HOSPITAL_COMMUNITY): Payer: No Typology Code available for payment source | Attending: Cardiology

## 2020-04-17 ENCOUNTER — Ambulatory Visit (INDEPENDENT_AMBULATORY_CARE_PROVIDER_SITE_OTHER): Payer: No Typology Code available for payment source | Admitting: Family Medicine

## 2020-04-17 VITALS — BP 132/74 | HR 83 | Temp 97.8°F | Resp 16 | Ht 62.5 in | Wt 197.4 lb

## 2020-04-17 DIAGNOSIS — R0789 Other chest pain: Secondary | ICD-10-CM | POA: Diagnosis not present

## 2020-04-17 DIAGNOSIS — R110 Nausea: Secondary | ICD-10-CM

## 2020-04-17 DIAGNOSIS — R7303 Prediabetes: Secondary | ICD-10-CM

## 2020-04-17 DIAGNOSIS — Z0001 Encounter for general adult medical examination with abnormal findings: Secondary | ICD-10-CM | POA: Diagnosis not present

## 2020-04-17 DIAGNOSIS — I447 Left bundle-branch block, unspecified: Secondary | ICD-10-CM

## 2020-04-17 DIAGNOSIS — Z0000 Encounter for general adult medical examination without abnormal findings: Secondary | ICD-10-CM

## 2020-04-17 DIAGNOSIS — Z6835 Body mass index (BMI) 35.0-35.9, adult: Secondary | ICD-10-CM

## 2020-04-17 DIAGNOSIS — E7849 Other hyperlipidemia: Secondary | ICD-10-CM

## 2020-04-17 DIAGNOSIS — Z1159 Encounter for screening for other viral diseases: Secondary | ICD-10-CM | POA: Diagnosis not present

## 2020-04-17 LAB — MYOCARDIAL PERFUSION IMAGING
LV dias vol: 79 mL (ref 46–106)
LV sys vol: 31 mL
Peak HR: 117 {beats}/min
Rest HR: 80 {beats}/min
SDS: 4
SRS: 3
SSS: 7
TID: 1

## 2020-04-17 MED ORDER — AMINOPHYLLINE 25 MG/ML IV SOLN
150.0000 mg | Freq: Once | INTRAVENOUS | Status: AC
  Administered 2020-04-17: 150 mg via INTRAVENOUS

## 2020-04-17 MED ORDER — TECHNETIUM TC 99M TETROFOSMIN IV KIT
32.4000 | PACK | Freq: Once | INTRAVENOUS | Status: AC | PRN
  Administered 2020-04-17: 32.4 via INTRAVENOUS
  Filled 2020-04-17: qty 33

## 2020-04-17 MED ORDER — REGADENOSON 0.4 MG/5ML IV SOLN
0.4000 mg | Freq: Once | INTRAVENOUS | Status: AC
  Administered 2020-04-17: 0.4 mg via INTRAVENOUS

## 2020-04-17 MED ORDER — TECHNETIUM TC 99M TETROFOSMIN IV KIT
10.1000 | PACK | Freq: Once | INTRAVENOUS | Status: AC | PRN
  Administered 2020-04-17: 10.1 via INTRAVENOUS
  Filled 2020-04-17: qty 11

## 2020-04-17 MED ORDER — AMINOPHYLLINE 25 MG/ML IV SOLN
75.0000 mg | Freq: Once | INTRAVENOUS | Status: AC
  Administered 2020-04-17: 75 mg via INTRAVENOUS

## 2020-04-17 NOTE — Patient Instructions (Addendum)
F/U 6 months Candace [name]  

## 2020-04-17 NOTE — Progress Notes (Signed)
   Subjective:    Patient ID: Candace [name], female    DOB: 1985-11-12, 34 y.o.   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  Review Of Systems:  GEN- denies fatigue, fever, weight loss,weakness, recent illness HEENT- denies eye drainage, change in vision, nasal discharge, CVS- denies chest pain, palpitations RESP- denies SOB, cough, wheeze ABD- denies N/V, change in stools, abd pain GU- denies dysuria, hematuria, dribbling, incontinence MSK- denies joint pain, muscle aches, injury Neuro- denies headache, dizziness, syncope, seizure activity       Objective:    BP 132/74   Pulse 83   Temp 97.8 F (36.6 C) (Temporal)   Resp 16   Ht 5' 2.5" (1.588 m)   Wt 197 lb 6.4 oz (89.5 kg)   SpO2 99%   BMI 35.53 kg/m  GEN- NAD, alert and oriented x3 HEENT- PERRL, EOMI, non injected sclera, pink conjunctiva, MMM, oropharynx clear Neck- Supple, no thyromegaly CVS- RRR, no murmur RESP-CTAB ABD-NABS,soft,NT,ND EXT- No edema Pulses- Radial, DP- 2+        Assessment & Plan:   FALL/AUDIT C/Depression screen neg    Problem List Items Addressed This Visit      Unprioritized   Hyperlipidemia    Treating with dietary changes       Obesity    She is working on weight loss, following high protein diet Pre-diabetes noted Planning for GI surgery for her chronic N/V episodes       Prediabetes    Other Visit Diagnoses    Routine general medical  examination at a health care facility    -  Primary   CPE done, reviewed recent labs from [facility], hEP C screen today    Need for hepatitis C screening test       Relevant Orders   Hepatitis C antibody (Completed)      Note: This dictation was prepared with Dragon dictation along with smaller phrase technology. Any transcriptional errors that result from this process are unintentional.

## 2020-04-17 NOTE — Progress Notes (Signed)
Chief Complaint:   OBESITY Candace [name] is here to discuss her progress with her obesity treatment plan along with follow-up of her obesity related diagnoses. Candace [name] is on the Category 2 Plan and states she is following her eating plan approximately 90% of the time. Candace [name] states she is walking for 15-20 minutes 1 time per week.  Today's visit was #: 4 Starting weight: 201 lbs Starting date: 02/23/2020 Today's weight: 193 lbs Today's date: 04/11/2020 Total lbs lost to date: 8 Total lbs lost since last in-office visit: 2  Interim History: Candace [name] continues to do very well with weight loss on her lower carbohydrate plan. She even did well over Thanksgiving and has been able to avoiding higher sugar foods.  Subjective:   1. Vitamin D deficiency Candace [name] is stable on Vit D, and she denies nausea, vomiting, or muscle weakness. She requests a refill today.  2. Tachycardia Harriett is on metoprolol to help with tachycardia, but her blood pressure has been low with weight loss and she has had to skip doses to prevent hypotension. She has an appointment with Cardiology tomorrow.  3. Other insomnia Adria falls asleep easily but wakes up 4-5 hours later and frequently cannot fall asleep. She has not tried any medications to help.  4. At risk for complication associated with hypotension The patient is at a higher than average risk of hypotension due to weight loss and low blood pressure.  Assessment/Plan:   1. Vitamin D deficiency Low Vitamin D level contributes to fatigue and are associated with obesity, breast, and colon cancer. We will refill prescription Vitamin D for 1 month. Candace [name] will follow-up for routine testing of Vitamin D, at least 2-3 times per year to avoid over-replacement.  - ergocalciferol (VITAMIN D2) 1.25 MG (50000 UT) capsule; Take 1 capsule (50,000 Units total) by mouth once a week.  Dispense: 4 capsule; Refill: 0  2. Tachycardia Candace [name] is to increase her  water intake, as dehydration is common with weight loss and can contribute to hypotension and tachycardia. We will continue to follow closely.  3. Other insomnia The problem of recurrent insomnia was discussed. Orders and follow up as documented in patient record. Counseling: Intensive lifestyle modifications are the first line treatment for this issue. We discussed several lifestyle modifications today. Candace [name] agreed to start OTC melatonin 10 mg qhs, and the importance of 7-8 hours of sleep to help with health and weight loss was discussed. She will continue to work on diet, exercise and weight loss efforts.   4. At risk for complication associated with hypotension Candace [name] was given approximately 15 minutes of education and counseling today to help avoid hypotension. We discussed risks of hypotension with weight loss and signs of hypotension such as feeling lightheaded or unsteady.  Repetitive spaced learning was employed today to elicit superior memory formation and behavioral change.  5. Class 2 severe obesity with serious comorbidity and body mass index (BMI) of 35.0 to 35.9 in adult, unspecified obesity type [facility]) Candace [name] is currently in the action stage of change. As such, her goal is to continue with weight loss efforts. She has agreed to the Category 2 Plan or following a lower carbohydrate, vegetable and lean protein rich diet plan.   Exercise goals: As is.  Behavioral modification strategies: increasing lean protein intake, meal planning and cooking strategies and travel eating strategies.  Candace [name] has agreed to follow-up with our clinic in 2 to 3 weeks. She was informed of the importance of frequent follow-up visits  to maximize her success with intensive lifestyle modifications for her multiple health conditions.   Objective:   Blood pressure 101/67, pulse 65, temperature 98.3 F (36.8 C), height 5\' 2"  (1.575 m), weight 193 lb (87.5 kg), SpO2 98 %. Body mass index is 35.3  kg/m.  General: Cooperative, alert, well developed, in no acute distress. HEENT: Conjunctivae and lids unremarkable. Cardiovascular: Regular rhythm.  Lungs: Normal work of breathing. Neurologic: No focal deficits.   Lab Results  Component Value Date   CREATININE 1.02 11/30/2019   BUN 14 11/30/2019   NA 137 10/14/2019   K 4.0 10/14/2019   CL 105 10/14/2019   CO2 21 (L) 10/14/2019   Lab Results  Component Value Date   ALT 27 10/14/2019   AST 21 10/14/2019   ALKPHOS 62 10/14/2019   BILITOT 1.1 10/14/2019   Lab Results  Component Value Date   HGBA1C 5.7 (H) 02/23/2020   HGBA1C 5.6 04/05/2012   Lab Results  Component Value Date   INSULIN 10.8 02/23/2020   Lab Results  Component Value Date   TSH 0.998 12/07/2019   Lab Results  Component Value Date   CHOL 206 (H) 02/23/2020   HDL 56 02/23/2020   LDLCALC 142 (H) 02/23/2020   LDLDIRECT 112 (H) 09/10/2011   TRIG 43 02/23/2020   CHOLHDL 3.0 10/22/2018   Lab Results  Component Value Date   WBC 5.0 02/23/2020   HGB 12.6 02/23/2020   HCT 39.2 02/23/2020   MCV 88 02/23/2020   PLT 238 02/23/2020   Lab Results  Component Value Date   IRON 44 09/10/2011   TIBC 391 09/10/2011   Attestation Statements:   Reviewed by clinician on day of visit: allergies, medications, problem list, medical history, surgical history, family history, social history, and previous encounter notes.   I, 11/10/2011, am acting as transcriptionist for Burt Knack, MD.  I have reviewed the above documentation for accuracy and completeness, and I agree with the above. -  Quillian Quince, MD

## 2020-04-18 ENCOUNTER — Encounter: Payer: Self-pay | Admitting: Family Medicine

## 2020-04-18 LAB — HEPATITIS C ANTIBODY
Hepatitis C Ab: NONREACTIVE
SIGNAL TO CUT-OFF: 0.36 (ref <1.00)

## 2020-04-18 NOTE — Assessment & Plan Note (Signed)
Treating with dietary changes

## 2020-04-18 NOTE — Assessment & Plan Note (Signed)
She is working on weight loss, following high protein diet Pre-diabetes noted Planning for GI surgery for her chronic N/V episodes

## 2020-04-25 ENCOUNTER — Encounter (INDEPENDENT_AMBULATORY_CARE_PROVIDER_SITE_OTHER): Payer: Self-pay | Admitting: Family Medicine

## 2020-04-25 ENCOUNTER — Ambulatory Visit (INDEPENDENT_AMBULATORY_CARE_PROVIDER_SITE_OTHER): Payer: No Typology Code available for payment source | Admitting: Family Medicine

## 2020-04-25 ENCOUNTER — Other Ambulatory Visit: Payer: Self-pay

## 2020-04-25 VITALS — BP 117/78 | HR 87 | Temp 98.5°F | Ht 62.0 in | Wt 193.0 lb

## 2020-04-25 DIAGNOSIS — Z6835 Body mass index (BMI) 35.0-35.9, adult: Secondary | ICD-10-CM

## 2020-04-25 DIAGNOSIS — K219 Gastro-esophageal reflux disease without esophagitis: Secondary | ICD-10-CM

## 2020-04-30 ENCOUNTER — Other Ambulatory Visit (HOSPITAL_COMMUNITY): Payer: Self-pay | Admitting: Family Medicine

## 2020-04-30 ENCOUNTER — Other Ambulatory Visit (INDEPENDENT_AMBULATORY_CARE_PROVIDER_SITE_OTHER): Payer: Self-pay

## 2020-04-30 DIAGNOSIS — E559 Vitamin D deficiency, unspecified: Secondary | ICD-10-CM

## 2020-04-30 MED ORDER — ERGOCALCIFEROL 1.25 MG (50000 UT) PO CAPS: 50000.0000 [IU] | ORAL_CAPSULE | ORAL | 0 refills | Status: DC

## 2020-04-30 NOTE — Progress Notes (Signed)
Chief Complaint:   OBESITY Candace [name] is here to discuss her progress with her obesity treatment plan along with follow-up of her obesity related diagnoses. Candace [name] is on the Category 2 Plan or following a lower carbohydrate, vegetable and lean protein rich diet plan and states she is following her eating plan approximately 85-90% of the time. Candace [name] states she is walking for 30-60 minutes 1-2 times per week.  Today's visit was #: 5 Starting weight: 201 lbs Starting date: 02/23/2020 Today's weight: 193 lbs Today's date: 04/25/2020 Total lbs lost to date: 8 Total lbs lost since last in-office visit: 0  Interim History: Candace [name] continues to do well with maintaining her weight even over Thanksgiving. Her hunger is controlled on her plan and she is not getting bored.  Subjective:   1. Gastroesophageal reflux disease, unspecified whether esophagitis present Candace [name] notes tightness and burning in her throat, as well as nausea. She didn't get relief from PPI of Prilosec. She is getting ready to have a fundoplication when she loses enough weight.  Assessment/Plan:   1. Gastroesophageal reflux disease, unspecified whether esophagitis present Intensive lifestyle modifications are the first line treatment for this issue. We discussed several lifestyle modifications today. Candace [name] was encouraged to try 2 Tums 2-3 times per day for now for temporary relief. She will continue to work on diet, exercise and weight loss efforts. Orders and follow up as documented in patient record.   2. Class 2 severe obesity with serious comorbidity and body mass index (BMI) of 35.0 to 35.9 in adult, unspecified obesity type Candace [name]) Candace [name] is currently in the action stage of change. As such, her goal is to continue with weight loss efforts. She has agreed to the Category 2 Plan.   Exercise goals: As is.  Behavioral modification strategies: decreasing simple carbohydrates and meal planning and cooking  strategies.  Candace [name] has agreed to follow-up with our clinic in 3 weeks. She was informed of the importance of frequent follow-up visits to maximize her success with intensive lifestyle modifications for her multiple health conditions.   Objective:   Blood pressure 117/78, pulse 87, temperature 98.5 F (36.9 C), height 5\' 2"  (1.575 m), weight 193 lb (87.5 kg), SpO2 100 %. Body mass index is 35.3 kg/m.  General: Cooperative, alert, well developed, in no acute distress. HEENT: Conjunctivae and lids unremarkable. Cardiovascular: Regular rhythm.  Lungs: Normal work of breathing. Neurologic: No focal deficits.   Lab Results  Component Value Date   CREATININE 1.02 11/30/2019   BUN 14 11/30/2019   NA 137 10/14/2019   K 4.0 10/14/2019   CL 105 10/14/2019   CO2 21 (L) 10/14/2019   Lab Results  Component Value Date   ALT 27 10/14/2019   AST 21 10/14/2019   ALKPHOS 62 10/14/2019   BILITOT 1.1 10/14/2019   Lab Results  Component Value Date   HGBA1C 5.7 (H) 02/23/2020   HGBA1C 5.6 04/05/2012   Lab Results  Component Value Date   INSULIN 10.8 02/23/2020   Lab Results  Component Value Date   TSH 0.998 12/07/2019   Lab Results  Component Value Date   CHOL 206 (H) 02/23/2020   HDL 56 02/23/2020   LDLCALC 142 (H) 02/23/2020   LDLDIRECT 112 (H) 09/10/2011   TRIG 43 02/23/2020   CHOLHDL 3.0 10/22/2018   Lab Results  Component Value Date   WBC 5.0 02/23/2020   HGB 12.6 02/23/2020   HCT 39.2 02/23/2020   MCV 88 02/23/2020   PLT 238  02/23/2020   Lab Results  Component Value Date   IRON 44 09/10/2011   TIBC 391 09/10/2011   Attestation Statements:   Reviewed by clinician on day of visit: allergies, medications, problem list, medical history, surgical history, family history, social history, and previous encounter notes.  Time spent on visit including pre-visit chart review and post-visit care and charting was 25 minutes.    I, Burt Knack, am acting as  transcriptionist for Quillian Quince, MD.  I have reviewed the above documentation for accuracy and completeness, and I agree with the above. -  Quillian Quince, MD

## 2020-05-11 MED FILL — VIT D2 1.25 MG (50,000 UNIT: 1.25 MG | 28 days supply | Qty: 4 | Fill #0

## 2020-05-16 ENCOUNTER — Other Ambulatory Visit: Payer: Self-pay

## 2020-05-16 ENCOUNTER — Ambulatory Visit (INDEPENDENT_AMBULATORY_CARE_PROVIDER_SITE_OTHER): Payer: No Typology Code available for payment source | Admitting: Family Medicine

## 2020-05-16 ENCOUNTER — Encounter (INDEPENDENT_AMBULATORY_CARE_PROVIDER_SITE_OTHER): Payer: Self-pay | Admitting: Family Medicine

## 2020-05-16 VITALS — BP 108/71 | HR 82 | Temp 97.6°F | Ht 62.0 in | Wt 189.0 lb

## 2020-05-16 DIAGNOSIS — Z6834 Body mass index (BMI) 34.0-34.9, adult: Secondary | ICD-10-CM | POA: Diagnosis not present

## 2020-05-16 DIAGNOSIS — K219 Gastro-esophageal reflux disease without esophagitis: Secondary | ICD-10-CM | POA: Diagnosis not present

## 2020-05-16 DIAGNOSIS — E669 Obesity, unspecified: Secondary | ICD-10-CM

## 2020-05-17 NOTE — Progress Notes (Signed)
Chief Complaint:   OBESITY Candace [name] is here to discuss her progress with her obesity treatment plan along with follow-up of her obesity related diagnoses. Candace [name] is on the Category 2 Plan and states she is following her eating plan approximately 95% of the time. Candace [name] states she is walking for 30-60 minutes 3 times per week.  Today's visit was #: 6 Starting weight: 201 lbs Starting date: 02/23/2020 Today's weight: 189 lbs Today's date: 05/16/2020 Total lbs lost to date: 12 Total lbs lost since last in-office visit: 4  Interim History: Candace [name] has done very well with weight loss even over the holidays. Her weight loss goal was to get her BMI below 35, so she can have a fundoplication. She has met that goal now, but she would like to continue in the clinic to help keep off the weight she has lost.   Subjective:   1. Gastroesophageal reflux disease, unspecified whether esophagitis present Candace [name] is still having symptoms, and no change with weight loss. Her BMI is now at goal to have surgery.  Assessment/Plan:   1. Gastroesophageal reflux disease, unspecified whether esophagitis present Intensive lifestyle modifications are the first line treatment for this issue. We discussed several lifestyle modifications today. Candace [name] is to contact her GI to start the pre-op process, and we will continue to monitor and help with weight maintenance and perhaps a bit more weight loss. Orders and follow up as documented in patient record.   2. Class 1 obesity with serious comorbidity and body mass index (BMI) of 34.0 to 34.9 in adult, unspecified obesity type Candace [name] is currently in the action stage of change. As such, her goal is to continue with weight loss efforts. She has agreed to the Category 2 Plan.   Exercise goals: Candace [name] is to wear a pedometer to gage her daily steps while at work on her feet most days.  Behavioral modification strategies: increasing lean protein  intake.  Candace [name] has agreed to follow-up with our clinic in 4 weeks. She was informed of the importance of frequent follow-up visits to maximize her success with intensive lifestyle modifications for her multiple health conditions.   Objective:   Blood pressure 108/71, pulse 82, temperature 97.6 F (36.4 C), height 5' 2" (1.575 m), weight 189 lb (85.7 kg), SpO2 98 %. Body mass index is 34.57 kg/m.  General: Cooperative, alert, well developed, in no acute distress. HEENT: Conjunctivae and lids unremarkable. Cardiovascular: Regular rhythm.  Lungs: Normal work of breathing. Neurologic: No focal deficits.   Lab Results  Component Value Date   CREATININE 1.02 11/30/2019   BUN 14 11/30/2019   NA 137 10/14/2019   K 4.0 10/14/2019   CL 105 10/14/2019   CO2 21 (L) 10/14/2019   Lab Results  Component Value Date   ALT 27 10/14/2019   AST 21 10/14/2019   ALKPHOS 62 10/14/2019   BILITOT 1.1 10/14/2019   Lab Results  Component Value Date   HGBA1C 5.7 (H) 02/23/2020   HGBA1C 5.6 04/05/2012   Lab Results  Component Value Date   INSULIN 10.8 02/23/2020   Lab Results  Component Value Date   TSH 0.998 12/07/2019   Lab Results  Component Value Date   CHOL 206 (H) 02/23/2020   HDL 56 02/23/2020   LDLCALC 142 (H) 02/23/2020   LDLDIRECT 112 (H) 09/10/2011   TRIG 43 02/23/2020   CHOLHDL 3.0 10/22/2018   Lab Results  Component Value Date   WBC 5.0 02/23/2020   HGB 12.6  02/23/2020   HCT 39.2 02/23/2020   MCV 88 02/23/2020   PLT 238 02/23/2020   Lab Results  Component Value Date   IRON 44 09/10/2011   TIBC 391 09/10/2011   Attestation Statements:   Reviewed by clinician on day of visit: allergies, medications, problem list, medical history, surgical history, family history, social history, and previous encounter notes.  Time spent on visit including pre-visit chart review and post-visit care and charting was 22 minutes.    I, Trixie Dredge, am acting as  transcriptionist for Dennard Nip, MD.  I have reviewed the above documentation for accuracy and completeness, and I agree with the above. -  Dennard Nip, MD

## 2020-05-17 NOTE — Telephone Encounter (Signed)
That's terrific news. Please make this patient an appointment to see Dr. Barron Alvine for discussion of GERD and possible TIF.  Dr. Salena Saner and I have discussed the case.  - HD

## 2020-06-05 ENCOUNTER — Other Ambulatory Visit (INDEPENDENT_AMBULATORY_CARE_PROVIDER_SITE_OTHER): Payer: Self-pay | Admitting: Family Medicine

## 2020-06-05 ENCOUNTER — Ambulatory Visit (INDEPENDENT_AMBULATORY_CARE_PROVIDER_SITE_OTHER): Payer: No Typology Code available for payment source | Admitting: Gastroenterology

## 2020-06-05 ENCOUNTER — Encounter: Payer: Self-pay | Admitting: Gastroenterology

## 2020-06-05 ENCOUNTER — Encounter (INDEPENDENT_AMBULATORY_CARE_PROVIDER_SITE_OTHER): Payer: Self-pay

## 2020-06-05 VITALS — BP 108/74 | HR 96 | Ht 62.25 in | Wt 191.5 lb

## 2020-06-05 DIAGNOSIS — R1110 Vomiting, unspecified: Secondary | ICD-10-CM

## 2020-06-05 DIAGNOSIS — R933 Abnormal findings on diagnostic imaging of other parts of digestive tract: Secondary | ICD-10-CM

## 2020-06-05 DIAGNOSIS — R112 Nausea with vomiting, unspecified: Secondary | ICD-10-CM

## 2020-06-05 DIAGNOSIS — K449 Diaphragmatic hernia without obstruction or gangrene: Secondary | ICD-10-CM

## 2020-06-05 DIAGNOSIS — Z6834 Body mass index (BMI) 34.0-34.9, adult: Secondary | ICD-10-CM

## 2020-06-05 DIAGNOSIS — E669 Obesity, unspecified: Secondary | ICD-10-CM

## 2020-06-05 DIAGNOSIS — K219 Gastro-esophageal reflux disease without esophagitis: Secondary | ICD-10-CM

## 2020-06-05 DIAGNOSIS — E559 Vitamin D deficiency, unspecified: Secondary | ICD-10-CM

## 2020-06-05 NOTE — Progress Notes (Signed)
P  Chief Complaint:    GERD, discuss TIF   HPI:    Candace [name] is a 35 year old female with a history of obesity (BMI 34.7), borderline diabetes, hyperlipidemia, LBBB, referred to me by Dr. Myrtie Neither for evaluation of possible antireflux intervention with Transoral Incisionless Fundoplication (TIF) with a goal to stop or significantly reduce acid suppression therapy.  History of obesity, but has made significant strides at weight loss, with BMI now <35 through diet/exercise and following with [facility].  Index symptoms reported as belching and "nausea and vomiting after eating".  Symptoms occurred within hours of eating, and even typically within minutes.  Describes more of an effortless regurgitation with subsequent emesis.  Regurgitation is worse with supine. Sleeps HOB elevated. Independent of food types.  No heartburn.  Keeps empty cups and water bottles in car, house to "vomit" into. Usually bitter, sour taste, medications like recently ingested foods. Will occasionally rechew and reswallow regurgitant.  Does not tend to have retching.  No response at all to previous trial of Nexium, Prilosec, or Tums, along with multiple antiemetics as below.  GERD history: -Index symptoms: Belching, post prandial nausea/vomiting, regurgitation -Exacerbating features: -Medications trialed: Nexium, Prilosec, carafate; Zofran, Phenergan, Reglan -Current medications: None -Complications: Hiatal hernia  GERD evaluation: -Last EGD: 09/2019 -Barium esophagram: 10/17/2019: Vomiting elicited during transition from direct to supine positioning.  Normal motility.  Moderate gastroesophageal reflux demonstrated in supine position, small to moderate hiatal hernia. -Esophageal Manometry: -pH/Impedance: -Bravo: -GES: 11/09/2019: Normal -CT head: 12/2019: Normal  Endoscopic History: -EGD (12/2015): Normal -EGD (09/2019): LA grade 2, otherwise normal   GERD-HRQL Questionnaire Score:  16/50   Was recently in evaluated by Dr. Rennis Golden in the Cardiology Clinic for LBBB and tachycardia noted during ER evaluation for chest pain.  EKG unchanged from comparison in 2014 and otherwise negative troponins.  TTE with EF 50-55%.  Myoview stress test- normal. Currently holding BP meds to re-evaluate.     Review of systems:     No chest pain, no SOB, no fevers, no urinary sx   Past Medical History:  Diagnosis Date  . Allergy   . Anemia   . BV (bacterial vaginosis) 12/15/2012  . Chest pain   . Diabetes (HCC)    borderline DM per patient - denies DM  . Eczema   . GERD (gastroesophageal reflux disease)   . Headache(784.0)   . History of chlamydia   . History of gonorrhea   . Irregular heartbeat   . Shortness of breath   . Vaginal itching 12/15/2012  . Vaginal Pap smear, abnormal    f/u ok    Patient's surgical history, family medical history, social history, medications and allergies were all reviewed in Epic    Current Outpatient Medications  Medication Sig Dispense Refill  . ergocalciferol (VITAMIN D2) 1.25 MG (50000 UT) capsule Take 1 capsule (50,000 Units total) by mouth once a week. 4 capsule 0  . levonorgestrel (MIRENA) 20 MCG/24HR IUD 1 each by Intrauterine route once.    . Melatonin 10 MG TABS Take 1 tablet by mouth at bedtime as needed.     No current facility-administered medications for this visit.    Physical Exam:     BP 108/74   Pulse 96   Ht 5' 2.25" (1.581 m)   Wt 191 lb 8 oz (86.9 kg)   BMI 34.75 kg/m   GENERAL:  Pleasant female in NAD PSYCH: : Cooperative, normal affect EENT:  conjunctiva pink, mucous membranes  moist, neck supple without masses CARDIAC:  RRR, no murmur heard, no peripheral edema PULM: Normal respiratory effort, lungs CTA bilaterally, no wheezing ABDOMEN:  Nondistended, soft, nontender. No obvious masses, no hepatomegaly,  normal bowel sounds SKIN:  turgor, no lesions seen Musculoskeletal:  Normal muscle tone, normal  strength NEURO: Alert and oriented x 3, no focal neurologic deficits   IMPRESSION and PLAN:    1) Regurgitation/Rumination Syndrome  2) Nausea/Vomiting 3) Hiatal hernia 4) Eructation 5) Abnormal barium esophagram  Very interesting clinical presentation.  She does have gastroesophageal refluxate noted on barium esophagram, but her most bothersome symptoms actually seems more likely to be from overlapping Rumination Syndrome given her effortless regurgitation along with episodic remastication/reswallowing.  Further, she has had no clinical response to multiple PPIs, antiemetics, and promotility agent.  Discussed Rumination Syndrome at length today and plan for the following:  -Discussed Diaphragmatic Breathing exercises and provided with handout and detailed instruction -Discussed possible further diagnostics for Rumination Syndrome depending on response to above intervention, to include esophageal manometry to evaluate for increased intragastric pressures prior to regurgitation events -Sometimes supra gastric belching can be associated with Rumination Syndrome  -As above, she certainly also seems to have some signs of classic reflux, to include sour/bitter taste regurgitant, clear refluxate on barium esophagram, nocturnal symptoms (not typically seen with Rumination).  Therefore, tentatively plan for EGD for preoperative assessment, evaluation of hernia size, and rule out new erosive esophagitis since last endoscopy.  However, if symptoms completely resolve with Diaphragmatic breathing, will d/c EGD -No new medications -If there is any plan for antireflux surgery, suspect she will need EM and pH/Mii  6) Obesity (BMI 34) -Applauded her on her her significant efforts at weight loss  -RTC in 2-3 months or sooner as needed  The indications, risks, and benefits of EGD were explained to the patient in detail. Risks include but are not limited to bleeding, perforation, adverse reaction to  medications, and cardiopulmonary compromise. Sequelae include but are not limited to the possibility of surgery, hospitalization, and mortality. The patient verbalized understanding and wished to proceed. All questions answered, referred to scheduler. Further recommendations pending results of the exam.   I spent 45 minutes of time, including in depth chart review, independent review of results as outlined above, communicating results with the patient directly, face-to-face time with the patient, coordinating care, ordering studies and medications as appropriate, and documentation.      Shellia Cleverly ,DO, FACG 06/05/2020, 1:57 PM

## 2020-06-05 NOTE — Telephone Encounter (Signed)
MyChart message sent to pt to find out if they have enough medication to get them through until next appt.   

## 2020-06-05 NOTE — Patient Instructions (Addendum)
Diaphragmatic breathing  Patients are instructed to place one hand on the abdomen and one on the chest.They areinstructed to slowly inhale through the nose, and during inhalation, only the hand on the abdomen should rise, and the hand on the chest should move minimally. Patients then slowly exhale via the mouth.   If you are age 36 or older, your body mass index should be between 23-30. Your Body mass index is 34.75 kg/m. If this is out of the aforementioned range listed, please consider follow up with your Primary Care Provider.  If you are age 52 or younger, your body mass index should be between 19-25. Your Body mass index is 34.75 kg/m. If this is out of the aformentioned range listed, please consider follow up with your Primary Care Provider.    You have been scheduled for an endoscopy. Please follow written instructions given to you at your visit today. If you use inhalers (even only as needed), please bring them with you on the day of your procedure.  It was a pleasure to see you today!  Candace [name], D.O.

## 2020-06-05 NOTE — Telephone Encounter (Signed)
Pt stated that she is completely out.

## 2020-06-06 NOTE — Telephone Encounter (Signed)
Ok to refill 

## 2020-06-07 ENCOUNTER — Other Ambulatory Visit (INDEPENDENT_AMBULATORY_CARE_PROVIDER_SITE_OTHER): Payer: Self-pay | Admitting: Family Medicine

## 2020-06-07 MED FILL — VIT D2 1.25 MG (50,000 UNIT: 1.25 MG | 4 days supply | Qty: 4 | Fill #0

## 2020-06-13 ENCOUNTER — Other Ambulatory Visit: Payer: Self-pay

## 2020-06-13 ENCOUNTER — Ambulatory Visit (INDEPENDENT_AMBULATORY_CARE_PROVIDER_SITE_OTHER): Payer: No Typology Code available for payment source | Admitting: Family Medicine

## 2020-06-13 VITALS — BP 123/62 | HR 89 | Temp 98.0°F | Ht 62.0 in | Wt 188.0 lb

## 2020-06-13 DIAGNOSIS — E559 Vitamin D deficiency, unspecified: Secondary | ICD-10-CM

## 2020-06-13 DIAGNOSIS — R7303 Prediabetes: Secondary | ICD-10-CM

## 2020-06-13 DIAGNOSIS — E7849 Other hyperlipidemia: Secondary | ICD-10-CM | POA: Diagnosis not present

## 2020-06-13 DIAGNOSIS — Z6834 Body mass index (BMI) 34.0-34.9, adult: Secondary | ICD-10-CM

## 2020-06-13 DIAGNOSIS — E669 Obesity, unspecified: Secondary | ICD-10-CM

## 2020-06-13 DIAGNOSIS — Z9189 Other specified personal risk factors, not elsewhere classified: Secondary | ICD-10-CM

## 2020-06-14 LAB — COMPREHENSIVE METABOLIC PANEL
ALT: 15 IU/L (ref 0–32)
AST: 15 IU/L (ref 0–40)
Albumin/Globulin Ratio: 1.6 (ref 1.2–2.2)
Albumin: 4.2 g/dL (ref 3.8–4.8)
Alkaline Phosphatase: 65 IU/L (ref 44–121)
BUN/Creatinine Ratio: 13 (ref 9–23)
BUN: 12 mg/dL (ref 6–20)
Bilirubin Total: 1 mg/dL (ref 0.0–1.2)
CO2: 22 mmol/L (ref 20–29)
Calcium: 9 mg/dL (ref 8.7–10.2)
Chloride: 105 mmol/L (ref 96–106)
Creatinine, Ser: 0.93 mg/dL (ref 0.57–1.00)
GFR calc Af Amer: 92 mL/min/{1.73_m2} (ref >59)
GFR calc non Af Amer: 80 mL/min/{1.73_m2} (ref >59)
Globulin, Total: 2.6 g/dL (ref 1.5–4.5)
Glucose: 73 mg/dL (ref 65–99)
Potassium: 4.2 mmol/L (ref 3.5–5.2)
Sodium: 140 mmol/L (ref 134–144)
Total Protein: 6.8 g/dL (ref 6.0–8.5)

## 2020-06-14 LAB — VITAMIN D 25 HYDROXY (VIT D DEFICIENCY, FRACTURES): Vit D, 25-Hydroxy: 21.7 ng/mL — ABNORMAL LOW (ref 30.0–100.0)

## 2020-06-14 LAB — LIPID PANEL WITH LDL/HDL RATIO
Cholesterol, Total: 247 mg/dL — ABNORMAL HIGH (ref 100–199)
HDL: 60 mg/dL (ref >39)
LDL Chol Calc (NIH): 180 mg/dL — ABNORMAL HIGH (ref 0–99)
LDL/HDL Ratio: 3 ratio (ref 0.0–3.2)
Triglycerides: 45 mg/dL (ref 0–149)
VLDL Cholesterol Cal: 7 mg/dL (ref 5–40)

## 2020-06-14 LAB — HEMOGLOBIN A1C
Est. average glucose Bld gHb Est-mCnc: 114 mg/dL
Hgb A1c MFr Bld: 5.6 % (ref 4.8–5.6)

## 2020-06-14 LAB — INSULIN, RANDOM: INSULIN: 5.1 u[IU]/mL (ref 2.6–24.9)

## 2020-06-19 ENCOUNTER — Encounter: Payer: Self-pay | Admitting: Gastroenterology

## 2020-06-20 NOTE — Progress Notes (Signed)
Chief Complaint:   OBESITY Candace [name] is here to discuss her progress with her obesity treatment plan along with follow-up of her obesity related diagnoses. Candace [name] is on the Category 2 Plan and states she is following her eating plan approximately 90% of the time. Candace [name] states she is walking for 30 minutes 3 times per week.  Today's visit was #: 7 Starting weight: 201 lbs Starting date: 02/23/2020 Today's weight: 188 lbs Today's date: 06/13/2020 Total lbs lost to date: 13 Total lbs lost since last in-office visit: 1  Interim History: Candace [name] continues to do well with weight loss. She has had extra challenges with skipping lunch due to busy at work but she is trying to make better choices. Her protein intake is likely below goal which may affect her RMR.  Subjective:   1. Vitamin D deficiency Candace [name] is on Vit D, and she is due to have labs checked. She denies nausea, vomiting, or muscle weakness.  2. Pre-diabetes Candace [name] is working on diet and weight loss. She is trying to decrease sugar and liquid calories.  3. Other hyperlipidemia Candace [name] is working on decreasing her cholesterol in her diet. She is due to have labs checked. She denies chest pain.  4. At risk for impaired metabolic function Candace [name] is at increased risk for impaired metabolic function due to decreased protein and skipping meals.  Assessment/Plan:   1. Vitamin D deficiency Low Vitamin D level contributes to fatigue and are associated with obesity, breast, and colon cancer. We will check labs today. Candace [name] will follow-up for routine testing of Vitamin D, at least 2-3 times per year to avoid over-replacement.  - VITAMIN D 25 Hydroxy (Vit-D Deficiency, Fractures)  2. Pre-diabetes Candace [name] will continue to work on weight loss, exercise, and decreasing simple carbohydrates to help decrease the risk of diabetes. We will check labs today.  - Comprehensive metabolic panel - Hemoglobin A1c - Insulin,  random  3. Other hyperlipidemia Cardiovascular risk and specific lipid/LDL goals reviewed. We discussed several lifestyle modifications today. Candace [name] will continue to work on diet, exercise and weight loss efforts. We will check labs today. Orders and follow up as documented in patient record.   - Lipid Panel With LDL/HDL Ratio  4. At risk for impaired metabolic function Candace [name] was given approximately 15 minutes of impaired  metabolic function prevention counseling today. We discussed intensive lifestyle modifications today with an emphasis on specific nutrition and exercise instructions and strategies.   Repetitive spaced learning was employed today to elicit superior memory formation and behavioral change.  5. Class 1 obesity with serious comorbidity and body mass index (BMI) of 34.0 to 34.9 in adult, unspecified obesity type Candace [name] is currently in the action stage of change. As such, her goal is to continue with weight loss efforts. She has agreed to the Category 2 Plan.   Exercise goals: As is.  Behavioral modification strategies: increasing lean protein intake and no skipping meals.  Candace [name] has agreed to follow-up with our clinic in 2 to 3 weeks. She was informed of the importance of frequent follow-up visits to maximize her success with intensive lifestyle modifications for her multiple health conditions.   Candace [name] was informed we would discuss her lab results at her next visit unless there is a critical issue that needs to be addressed sooner. Candace [name] agreed to keep her next visit at the agreed upon time to discuss these results.  Objective:   Blood pressure 123/62, pulse 89, temperature 98 F (36.7 C), height  5\' 2"  (1.575 m), weight 188 lb (85.3 kg), SpO2 98 %. Body mass index is 34.39 kg/m.  General: Cooperative, alert, well developed, in no acute distress. HEENT: Conjunctivae and lids unremarkable. Cardiovascular: Regular rhythm.  Lungs: Normal work of  breathing. Neurologic: No focal deficits.   Lab Results  Component Value Date   CREATININE 0.93 06/13/2020   BUN 12 06/13/2020   NA 140 06/13/2020   K 4.2 06/13/2020   CL 105 06/13/2020   CO2 22 06/13/2020   Lab Results  Component Value Date   ALT 15 06/13/2020   AST 15 06/13/2020   ALKPHOS 65 06/13/2020   BILITOT 1.0 06/13/2020   Lab Results  Component Value Date   HGBA1C 5.6 06/13/2020   HGBA1C 5.7 (H) 02/23/2020   HGBA1C 5.6 04/05/2012   Lab Results  Component Value Date   INSULIN 5.1 06/13/2020   INSULIN 10.8 02/23/2020   Lab Results  Component Value Date   TSH 0.998 12/07/2019   Lab Results  Component Value Date   CHOL 247 (H) 06/13/2020   HDL 60 06/13/2020   LDLCALC 180 (H) 06/13/2020   LDLDIRECT 112 (H) 09/10/2011   TRIG 45 06/13/2020   CHOLHDL 3.0 10/22/2018   Lab Results  Component Value Date   WBC 5.0 02/23/2020   HGB 12.6 02/23/2020   HCT 39.2 02/23/2020   MCV 88 02/23/2020   PLT 238 02/23/2020   Lab Results  Component Value Date   IRON 44 09/10/2011   TIBC 391 09/10/2011   Attestation Statements:   Reviewed by clinician on day of visit: allergies, medications, problem list, medical history, surgical history, family history, social history, and previous encounter notes.   I, 11/10/2011, am acting as transcriptionist for Burt Knack, MD.  I have reviewed the above documentation for accuracy and completeness, and I agree with the above. -  Quillian Quince, MD

## 2020-06-26 ENCOUNTER — Encounter: Payer: Self-pay | Admitting: Gastroenterology

## 2020-06-26 ENCOUNTER — Ambulatory Visit (AMBULATORY_SURGERY_CENTER): Payer: No Typology Code available for payment source | Admitting: Gastroenterology

## 2020-06-26 ENCOUNTER — Other Ambulatory Visit: Payer: Self-pay

## 2020-06-26 VITALS — BP 108/81 | HR 65 | Temp 97.1°F | Resp 19 | Ht 62.0 in | Wt 191.0 lb

## 2020-06-26 DIAGNOSIS — K449 Diaphragmatic hernia without obstruction or gangrene: Secondary | ICD-10-CM | POA: Diagnosis not present

## 2020-06-26 DIAGNOSIS — K2100 Gastro-esophageal reflux disease with esophagitis, without bleeding: Secondary | ICD-10-CM | POA: Diagnosis present

## 2020-06-26 DIAGNOSIS — R112 Nausea with vomiting, unspecified: Secondary | ICD-10-CM

## 2020-06-26 MED ORDER — SODIUM CHLORIDE 0.9 % IV SOLN
500.0000 mL | INTRAVENOUS | Status: DC
Start: 2020-06-26 — End: 2020-06-26

## 2020-06-26 NOTE — Op Note (Signed)
[facility] Patient Name: Candace [name] Procedure Date: 06/26/2020 2:31 PM MRN: [id number] Endoscopist: Doristine Locks , MD Age: 35 Referring MD:  Date of Birth: April 28, 1986 Gender: Female Account #: [id number] Procedure:                Upper GI endoscopy Indications:              Esophageal reflux, Preoperative assessment for                            possibel antireflux surgery, Follow-up of hiatal                            hernia Medicines:                Monitored Anesthesia Care Procedure:                Pre-Anesthesia Assessment:                           - Prior to the procedure, a History and Physical                            was performed, and patient medications and                            allergies were reviewed. The patient's tolerance of                            previous anesthesia was also reviewed. The risks                            and benefits of the procedure and the sedation                            options and risks were discussed with the patient.                            All questions were answered, and informed consent                            was obtained. Prior Anticoagulants: The patient has                            taken no previous anticoagulant or antiplatelet                            agents. ASA Grade Assessment: III - A patient with                            severe systemic disease. After reviewing the risks                            and benefits, the patient was deemed in  satisfactory condition to undergo the procedure.                           After obtaining informed consent, the endoscope was                            passed under direct vision. Throughout the                            procedure, the patient's blood pressure, pulse, and                            oxygen saturations were monitored continuously. The                            Endoscope was introduced through the mouth, and                             advanced to the second part of duodenum. The upper                            GI endoscopy was accomplished without difficulty.                            The patient tolerated the procedure well. Scope In: Scope Out: Findings:                 Esophagogastric landmarks were identified: the                            Z-line was found at 34 cm, the gastroesophageal                            junction was found at 34 cm and the site of hiatal                            narrowing was found at 35 cm from the incisors.                           LA Grade A (one or more mucosal breaks less than 5                            mm, not extending between tops of 2 mucosal folds)                            esophagitis with no bleeding was found in the lower                            third of the esophagus. This was improved from the                            images on the previous study.  The gastroesophageal flap valve was visualized                            endoscopically and classified as Hill Grade III                            (minimal fold, loose to endoscope, hiatal hernia                            likely).                           A 1 cm axial height sliding type hiatal hernia was                            present.                           The entire examined stomach was normal.                           The examined duodenum was normal. Complications:            No immediate complications. Estimated Blood Loss:     Estimated blood loss: none. Impression:               - Esophagogastric landmarks identified.                           - LA Grade A reflux esophagitis with no bleeding.                           - Gastroesophageal flap valve classified as Hill                            Grade III (minimal fold, loose to endoscope, hiatal                            hernia likely).                           - 1 cm hiatal hernia.                            - Normal stomach.                           - Normal examined duodenum.                           - No specimens collected. Recommendation:           - Patient has a contact number available for                            emergencies. The signs and symptoms of potential  delayed complications were discussed with the                            patient. Return to normal activities tomorrow.                            Written discharge instructions were provided to the                            patient.                           - Resume previous diet.                           - Continue present medications.                           - Return to GI clinic at appointment to be                            scheduled.                           - Based on today's results and the previous                            studies, if planning antireflux surgery, would                            recommend combined laparoscopic hiatal hernia                            repair/crural repair with Transoral Incisionless                            Fundoplication (cTIF). Will need Esophageal                            Manometry as part of the pre-operative work-up. We                            will plan to discuss the role and utility of                            pH/Impedance testing at that same time to evaluate                            for continued refluxate vs overlapping Rumination.                            This would be done on PPI therapy. Doristine LocksVito Baxter Gonzalez, MD 06/26/2020 2:52:00 PM

## 2020-06-26 NOTE — Patient Instructions (Signed)
Handout given for hiatal hernia. ° °YOU HAD AN ENDOSCOPIC PROCEDURE TODAY AT THE [facility]:   Refer to the procedure report that was given to you for any specific questions about what was found during the examination.  If the procedure report does not answer your questions, please call your gastroenterologist to clarify.  If you requested that your care partner not be given the details of your procedure findings, then the procedure report has been included in a sealed envelope for you to review at your convenience later. ° °YOU SHOULD EXPECT: Some feelings of bloating in the abdomen. Passage of more gas than usual.  Walking can help get rid of the air that was put into your GI tract during the procedure and reduce the bloating. If you had a lower endoscopy (such as a colonoscopy or flexible sigmoidoscopy) you may notice spotting of blood in your stool or on the toilet paper. If you underwent a bowel prep for your procedure, you may not have a normal bowel movement for a few days. ° °Please Note:  You might notice some irritation and congestion in your nose or some drainage.  This is from the oxygen used during your procedure.  There is no need for concern and it should clear up in a day or so. ° °SYMPTOMS TO REPORT IMMEDIATELY: ° °Following upper endoscopy (EGD) ° Vomiting of blood or coffee ground material ° New chest pain or pain under the shoulder blades ° Painful or persistently difficult swallowing ° New shortness of breath ° Fever of 100°F or higher ° Black, tarry-looking stools ° °For urgent or emergent issues, a gastroenterologist can be reached at any hour by calling [phone]. °Do not use MyChart messaging for urgent concerns.  ° ° °DIET:  We do recommend a small meal at first, but then you may proceed to your regular diet.  Drink plenty of fluids but you should avoid alcoholic beverages for 24 hours. ° °ACTIVITY:  You should plan to take it easy for the rest of today and you should NOT  DRIVE or use heavy machinery until tomorrow (because of the sedation medicines used during the test).   ° °FOLLOW UP: °Our staff will call the number listed on your records 48-72 hours following your procedure to check on you and address any questions or concerns that you may have regarding the information given to you following your procedure. If we do not reach you, we will leave a message.  We will attempt to reach you two times.  During this call, we will ask if you have developed any symptoms of COVID 19. If you develop any symptoms (ie: fever, flu-like symptoms, shortness of breath, cough etc.) before then, please call [phone].  If you test positive for Covid 19 in the 2 weeks post procedure, please call and report this information to us.   ° °If any biopsies were taken you will be contacted by phone or by letter within the next 1-3 weeks.  Please call us at [phone] if you have not heard about the biopsies in 3 weeks.  ° ° °SIGNATURES/CONFIDENTIALITY: °You and/or your care partner have signed paperwork which will be entered into your electronic medical record.  These signatures attest to the fact that that the information above on your After Visit Summary has been reviewed and is understood.  Full responsibility of the confidentiality of this discharge information lies with you and/or your care-partner.  °

## 2020-06-26 NOTE — Progress Notes (Signed)
To PACU, VSS. Report to [email or web address] 

## 2020-06-27 ENCOUNTER — Telehealth: Payer: Self-pay | Admitting: General Surgery

## 2020-06-27 ENCOUNTER — Other Ambulatory Visit: Payer: Self-pay | Admitting: General Surgery

## 2020-06-27 DIAGNOSIS — K449 Diaphragmatic hernia without obstruction or gangrene: Secondary | ICD-10-CM

## 2020-06-27 DIAGNOSIS — R112 Nausea with vomiting, unspecified: Secondary | ICD-10-CM

## 2020-06-27 DIAGNOSIS — K219 Gastro-esophageal reflux disease without esophagitis: Secondary | ICD-10-CM

## 2020-06-27 NOTE — Progress Notes (Signed)
error 

## 2020-06-27 NOTE — Telephone Encounter (Signed)
-----   Message from [facility], DO sent at 06/26/2020  5:02 PM EST ----- Can you please set this patient up for EM and pH/Impedance (off PPI) at [facility] for pre-op assessment? Thanks.

## 2020-06-27 NOTE — Addendum Note (Signed)
Addended by: Latricia Heft A on: 06/27/2020 02:19 PM   Modules accepted: Orders, SmartSet

## 2020-06-27 NOTE — Telephone Encounter (Signed)
Left a voicemail for [place] I will send instruction for her Esophageal manometry with ph impendence to her mychart and her home. Also expressed on her voicemail regardless of vaccine status she will need a covid test.

## 2020-06-28 ENCOUNTER — Telehealth: Payer: Self-pay | Admitting: *Deleted

## 2020-06-28 NOTE — Telephone Encounter (Signed)
  Follow up Call-  Call back number 06/26/2020 10/05/2019  Post procedure Call Back phone  # [phone]  Permission to leave phone message Yes Yes  Some recent data might be hidden     No answer at 2nd attempt follow up phone call.  Left message on voicemail.

## 2020-07-05 ENCOUNTER — Ambulatory Visit (INDEPENDENT_AMBULATORY_CARE_PROVIDER_SITE_OTHER): Payer: No Typology Code available for payment source | Admitting: Physician Assistant

## 2020-07-05 ENCOUNTER — Ambulatory Visit (INDEPENDENT_AMBULATORY_CARE_PROVIDER_SITE_OTHER): Payer: No Typology Code available for payment source | Admitting: Bariatrics

## 2020-07-05 ENCOUNTER — Encounter (INDEPENDENT_AMBULATORY_CARE_PROVIDER_SITE_OTHER): Payer: Self-pay | Admitting: Physician Assistant

## 2020-07-05 ENCOUNTER — Other Ambulatory Visit: Payer: Self-pay

## 2020-07-05 ENCOUNTER — Other Ambulatory Visit (INDEPENDENT_AMBULATORY_CARE_PROVIDER_SITE_OTHER): Payer: Self-pay | Admitting: Physician Assistant

## 2020-07-05 VITALS — BP 106/73 | HR 90 | Temp 97.9°F | Ht 62.0 in | Wt 188.0 lb

## 2020-07-05 DIAGNOSIS — E7849 Other hyperlipidemia: Secondary | ICD-10-CM

## 2020-07-05 DIAGNOSIS — E669 Obesity, unspecified: Secondary | ICD-10-CM

## 2020-07-05 DIAGNOSIS — R7303 Prediabetes: Secondary | ICD-10-CM

## 2020-07-05 DIAGNOSIS — Z9189 Other specified personal risk factors, not elsewhere classified: Secondary | ICD-10-CM

## 2020-07-05 DIAGNOSIS — E559 Vitamin D deficiency, unspecified: Secondary | ICD-10-CM

## 2020-07-05 DIAGNOSIS — Z6834 Body mass index (BMI) 34.0-34.9, adult: Secondary | ICD-10-CM

## 2020-07-05 DIAGNOSIS — E785 Hyperlipidemia, unspecified: Secondary | ICD-10-CM

## 2020-07-05 MED ORDER — VITAMIN D (ERGOCALCIFEROL) 1.25 MG (50000 UNIT) PO CAPS
50000.0000 [IU] | ORAL_CAPSULE | ORAL | 0 refills | Status: DC
Start: 2020-07-05 — End: 2020-07-31

## 2020-07-05 MED FILL — VIT D2 1.25 MG (50,000 UNIT: 1.25 MG | 28 days supply | Qty: 4 | Fill #0

## 2020-07-09 ENCOUNTER — Other Ambulatory Visit: Payer: Self-pay | Admitting: Internal Medicine

## 2020-07-09 MED ORDER — ATORVASTATIN CALCIUM 40 MG PO TABS: 40.0000 mg | ORAL_TABLET | Freq: Every day | ORAL | 3 refills | Status: DC

## 2020-07-09 MED FILL — ATORVASTATIN 40 MG TABLET: 40 | 90 days supply | Qty: 90 | Fill #0

## 2020-07-09 NOTE — Progress Notes (Signed)
Chief Complaint:   OBESITY Candace [name] is here to discuss her progress with her obesity treatment plan along with follow-up of her obesity related diagnoses. Candace [name] is on the Category 2 Plan and states she is following her eating plan approximately 90-95% of the time. Candace [name] states she is walking for 30 minutes 2 times per week.  Today's visit was #: 8 Starting weight: 201 lbs Starting date: 02/23/2020 Today's weight: 188 lbs Today's date: 07/05/2020 Total lbs lost to date: 13 Total lbs lost since last in-office visit: 0  Interim History: Candace [name] works three 12 hour shifts and sometimes skips lunch or has a protein shake. She is drinking regular cranberry juice with breakfast because she enjoys it.  Subjective:   1. Vitamin D deficiency Candace [name]'s last Vit D level was not at goal, but has improved. I discussed labs with the patient today.  2. Pre-diabetes Candace [name]'s last A1c improved to 5.6 from 5.7. She denies polyphagia. I discussed labs with the patient today.  3. Other hyperlipidemia Candace [name]'s last lipid panel was not at goal. Her LDL and triglycerides have worsened since her last visit. She is unsure about FH. She is followed by Candace [name], Cardiology.  4. At risk for osteoporosis Candace [name] is at higher risk of osteopenia and osteoporosis due to Vitamin D deficiency.   Assessment/Plan:   1. Vitamin D deficiency Low Vitamin D level contributes to fatigue and are associated with obesity, breast, and colon cancer. We will refill prescription Vitamin D for 1 month. Candace [name] will follow-up for routine testing of Vitamin D, at least 2-3 times per year to avoid over-replacement.  - Vitamin D, Ergocalciferol, (DRISDOL) 1.25 MG (50000 UNIT) CAPS capsule; Take 1 capsule (50,000 Units total) by mouth once a week.  Dispense: 4 capsule; Refill: 0  2. Pre-diabetes Candace [name] will continue to work on weight loss, exercise, and decreasing simple carbohydrates to help decrease the risk  of diabetes.   3. Other hyperlipidemia Cardiovascular risk and specific lipid/LDL goals reviewed. We discussed several lifestyle modifications today. Candace [name] will follow up with Candace [name] in 2 weeks. She will continue to work on diet, exercise and weight loss efforts. Orders and follow up as documented in patient record.   Counseling Intensive lifestyle modifications are the first line treatment for this issue. . Dietary changes: Increase soluble fiber. Decrease simple carbohydrates. . Exercise changes: Moderate to vigorous-intensity aerobic activity 150 minutes per week if tolerated. . Lipid-lowering medications: see documented in medical record.  4. At risk for osteoporosis Candace [name] was given approximately 15 minutes of osteoporosis prevention counseling today. Candace [name] is at risk for osteopenia and osteoporosis due to her Vitamin D deficiency. She was encouraged to take her Vitamin D and follow her higher calcium diet and increase strengthening exercise to help strengthen her bones and decrease her risk of osteopenia and osteoporosis.  Repetitive spaced learning was employed today to elicit superior memory formation and behavioral change.  5. Class 1 obesity with serious comorbidity and body mass index (BMI) of 34.0 to 34.9 in adult, unspecified obesity type Candace [name] is currently in the action stage of change. As such, her goal is to continue with weight loss efforts. She has agreed to the Category 2 Plan.   Candace [name] is to change to diet juice, eat more protein on work day at other meals.  Exercise goals: As is.  Behavioral modification strategies: no skipping meals and meal planning and cooking strategies.  Candace [name] has agreed to follow-up with our clinic in 2 weeks.  She was informed of the importance of frequent follow-up visits to maximize her success with intensive lifestyle modifications for her multiple health conditions.   Objective:   Blood pressure 106/73, pulse 90,  temperature 97.9 F (36.6 C), height 5\' 2"  (1.575 m), weight 188 lb (85.3 kg), SpO2 99 %. Body mass index is 34.39 kg/m.  General: Cooperative, alert, well developed, in no acute distress. HEENT: Conjunctivae and lids unremarkable. Cardiovascular: Regular rhythm.  Lungs: Normal work of breathing. Neurologic: No focal deficits.   Lab Results  Component Value Date   CREATININE 0.93 06/13/2020   BUN 12 06/13/2020   NA 140 06/13/2020   K 4.2 06/13/2020   CL 105 06/13/2020   CO2 22 06/13/2020   Lab Results  Component Value Date   ALT 15 06/13/2020   AST 15 06/13/2020   ALKPHOS 65 06/13/2020   BILITOT 1.0 06/13/2020   Lab Results  Component Value Date   HGBA1C 5.6 06/13/2020   HGBA1C 5.7 (H) 02/23/2020   HGBA1C 5.6 04/05/2012   Lab Results  Component Value Date   INSULIN 5.1 06/13/2020   INSULIN 10.8 02/23/2020   Lab Results  Component Value Date   TSH 0.998 12/07/2019   Lab Results  Component Value Date   CHOL 247 (H) 06/13/2020   HDL 60 06/13/2020   LDLCALC 180 (H) 06/13/2020   LDLDIRECT 112 (H) 09/10/2011   TRIG 45 06/13/2020   CHOLHDL 3.0 10/22/2018   Lab Results  Component Value Date   WBC 5.0 02/23/2020   HGB 12.6 02/23/2020   HCT 39.2 02/23/2020   MCV 88 02/23/2020   PLT 238 02/23/2020   Lab Results  Component Value Date   IRON 44 09/10/2011   TIBC 391 09/10/2011   Attestation Statements:   Reviewed by clinician on day of visit: allergies, medications, problem list, medical history, surgical history, family history, social history, and previous encounter notes.   11/10/2011, am acting as transcriptionist for Trude Mcburney, PA-C.  I have reviewed the above documentation for accuracy and completeness, and I agree with the above. -  [employer], PA-C ]

## 2020-07-16 ENCOUNTER — Ambulatory Visit (INDEPENDENT_AMBULATORY_CARE_PROVIDER_SITE_OTHER): Payer: No Typology Code available for payment source | Admitting: Family Medicine

## 2020-07-16 ENCOUNTER — Encounter (INDEPENDENT_AMBULATORY_CARE_PROVIDER_SITE_OTHER): Payer: Self-pay | Admitting: Family Medicine

## 2020-07-16 ENCOUNTER — Other Ambulatory Visit: Payer: Self-pay

## 2020-07-16 VITALS — BP 103/70 | HR 88 | Temp 98.1°F | Ht 62.0 in | Wt 188.0 lb

## 2020-07-16 DIAGNOSIS — Z6834 Body mass index (BMI) 34.0-34.9, adult: Secondary | ICD-10-CM

## 2020-07-16 DIAGNOSIS — E669 Obesity, unspecified: Secondary | ICD-10-CM | POA: Diagnosis not present

## 2020-07-16 DIAGNOSIS — E7849 Other hyperlipidemia: Secondary | ICD-10-CM | POA: Diagnosis not present

## 2020-07-17 NOTE — Progress Notes (Signed)
Chief Complaint:   OBESITY Candace [name] is here to discuss her progress with her obesity treatment plan along with follow-up of her obesity related diagnoses. Candace [name] is on the Category 2 Plan and states she is following her eating plan approximately 90% of the time. Candace [name] states she is walking for 30 minutes 2-3 times per week.  Today's visit was #: 9 Starting weight: 201 lbs Starting date: 02/23/2020 Today's weight: 188 lbs Today's date: 07/16/2020 Total lbs lost to date: 13 Total lbs lost since last in-office visit: 0  Interim History: Candace [name] has done well maintaining her weight loss. She is stalling a bit with her weight loss and needs to start increasing exercise, but her Cardiologist wants her to avoid cardio strengthening due to her rapid heart rate.  Subjective:   1. Other hyperlipidemia Candace [name]'s LDL was elevated at 180. He recently started a mid-dose of Lipitor. No myalgias or chest pain noted.  Assessment/Plan:   1. Other hyperlipidemia Cardiovascular risk and specific lipid/LDL goals reviewed. We discussed several lifestyle modifications today. Candace [name] will continue taking Lipitor, and will continue to work on diet, exercise and weight loss efforts. Orders and follow up as documented in patient record.   2. Class 1 obesity with serious comorbidity and body mass index (BMI) of 34.0 to 34.9 in adult, unspecified obesity type Candace [name] is currently in the action stage of change. As such, her goal is to continue with weight loss efforts. She has agreed to the Category 2 Plan.   Exercise goals: Start 10 minutes per day activity. Strengthening exercises especially.  Behavioral modification strategies: meal planning and cooking strategies.  Candace [name] has agreed to follow-up with our clinic in 2 to 3 weeks. She was informed of the importance of frequent follow-up visits to maximize her success with intensive lifestyle modifications for her multiple health conditions.    Objective:   Blood pressure 103/70, pulse 88, temperature 98.1 F (36.7 C), temperature source Oral, height 5\' 2"  (1.575 m), weight 188 lb (85.3 kg), SpO2 98 %. Body mass index is 34.39 kg/m.  General: Cooperative, alert, well developed, in no acute distress. HEENT: Conjunctivae and lids unremarkable. Cardiovascular: Regular rhythm.  Lungs: Normal work of breathing. Neurologic: No focal deficits.   Lab Results  Component Value Date   CREATININE 0.93 06/13/2020   BUN 12 06/13/2020   NA 140 06/13/2020   K 4.2 06/13/2020   CL 105 06/13/2020   CO2 22 06/13/2020   Lab Results  Component Value Date   ALT 15 06/13/2020   AST 15 06/13/2020   ALKPHOS 65 06/13/2020   BILITOT 1.0 06/13/2020   Lab Results  Component Value Date   HGBA1C 5.6 06/13/2020   HGBA1C 5.7 (H) 02/23/2020   HGBA1C 5.6 04/05/2012   Lab Results  Component Value Date   INSULIN 5.1 06/13/2020   INSULIN 10.8 02/23/2020   Lab Results  Component Value Date   TSH 0.998 12/07/2019   Lab Results  Component Value Date   CHOL 247 (H) 06/13/2020   HDL 60 06/13/2020   LDLCALC 180 (H) 06/13/2020   LDLDIRECT 112 (H) 09/10/2011   TRIG 45 06/13/2020   CHOLHDL 3.0 10/22/2018   Lab Results  Component Value Date   WBC 5.0 02/23/2020   HGB 12.6 02/23/2020   HCT 39.2 02/23/2020   MCV 88 02/23/2020   PLT 238 02/23/2020   Lab Results  Component Value Date   IRON 44 09/10/2011   TIBC 391 09/10/2011   Attestation Statements:  Reviewed by clinician on day of visit: allergies, medications, problem list, medical history, surgical history, family history, social history, and previous encounter notes.  Time spent on visit including pre-visit chart review and post-visit care and charting was 40 minutes.    I, Burt Knack, am acting as transcriptionist for Quillian Quince, MD.  I have reviewed the above documentation for accuracy and completeness, and I agree with the above. -  Quillian Quince, MD

## 2020-07-19 ENCOUNTER — Other Ambulatory Visit: Payer: Self-pay

## 2020-07-19 ENCOUNTER — Ambulatory Visit (INDEPENDENT_AMBULATORY_CARE_PROVIDER_SITE_OTHER): Payer: No Typology Code available for payment source | Admitting: Internal Medicine

## 2020-07-19 ENCOUNTER — Encounter: Payer: Self-pay | Admitting: Internal Medicine

## 2020-07-19 VITALS — BP 104/80 | HR 69 | Ht 62.25 in | Wt 194.0 lb

## 2020-07-19 DIAGNOSIS — R0600 Dyspnea, unspecified: Secondary | ICD-10-CM

## 2020-07-19 DIAGNOSIS — I447 Left bundle-branch block, unspecified: Secondary | ICD-10-CM | POA: Diagnosis not present

## 2020-07-19 DIAGNOSIS — R0789 Other chest pain: Secondary | ICD-10-CM | POA: Diagnosis not present

## 2020-07-19 NOTE — Patient Instructions (Signed)
Medication Instructions:  Dr. Rennis Golden advises that you use METOPROLOL TARTRATE 12.5mg  as needed (mid-morning) for tachycardia Continue all other current medications  *If you need a refill on your cardiac medications before your next appointment, please call your pharmacy*   Lab Work: NONE If you have labs (blood work) drawn today and your tests are completely normal, you will receive your results only by: Marland Kitchen MyChart Message (if you have MyChart) OR . A paper copy in the mail If you have any lab test that is abnormal or we need to change your treatment, we will call you to review the results.   Testing/Procedures: Cardiac MRI @ [facility] - this test will be pre-approved with insurance first and then you will be contacted to schedule   Follow-Up: At [facility], you and your health needs are our priority.  As part of our continuing mission to provide you with exceptional heart care, we have created designated Provider Care Teams.  These Care Teams include your primary Cardiologist (physician) and Advanced Practice Providers (APPs -  Physician Assistants and Nurse Practitioners) who all work together to provide you with the care you need, when you need it.  We recommend signing up for the patient portal called "MyChart".  Sign up information is provided on this After Visit Summary.  MyChart is used to connect with patients for Virtual Visits (Telemedicine).  Patients are able to view lab/test results, encounter notes, upcoming appointments, etc.  Non-urgent messages can be sent to your provider as well.   To learn more about what you can do with MyChart, go to [email or web address].    Your next appointment:   4-6 weeks (after cardiac MRI)  The format for your next appointment:   In Person  Provider:   You may see Chrystie Nose, MD or one of the following Advanced Practice Providers on your designated Care Team:    Azalee Course, PA-C  Micah Flesher, PA-C or   Judy Pimple,  [place]    Other Instructions

## 2020-07-19 NOTE — Progress Notes (Signed)
OFFICE NOTE  Chief Complaint:  Follow-up  Primary Care Physician: Salley Scarlet, MD  HPI:  Candace [name] is a 35 y.o. female with a past medial history significant for recent chest pain, borderline diabetes, GERD, and irregular heartbeat.  She was recently seen in the emergency department for chest pain.  She had been having nausea and vomiting and been undergoing a GI work-up.  She had troponins that were negative.  EKG showed an IVCD/left bundle branch block which seems to be unchanged compared to an EKG in 2014.  BNP was low and I did not recommend any further inpatient testing.  She did have an outpatient echo however which showed a reduced LVEF to 50 to 55%.  Asymmetric left ventricular hypertrophy of the inferolateral segment.  Additionally there was reported to be basal ventricular septal thinning and hypokinesis.  A cardiac MRI was recommended to evaluate for a ? Granulomatous process.  She was seen in the interim by Edd Fabian, NP who had noted that he felt she had atypical chest pain which seem to have resolved.  He had started her on metoprolol tartrate due to tachycardia at 12.5 mg twice daily.  Since then she says she has not felt well.  At times she gets low blood pressure with her elevated heart rate and is concerned about the medication.  She has seen Dr. Myrtie Neither with GI for her vomiting/GERD symptoms.  07/19/2020  Candace [name] is seen today in follow-up.  She continues to have issues with chest discomfort.  She is scheduled to have esophageal manometry in March.  She is also been short of breath.  EKG shows sinus rhythm with a stable left bundle branch block at 69.  She had lipids in February showing a very high cholesterol.  Total is 247, HDL 60, LDL 180 and triglycerides 45.  She did undergo Myoview stress testing in December which was negative for ischemia and showed normal LV function.  PMHx:  Past Medical History:  Diagnosis Date  . Allergy   . Anemia   . BV  (bacterial vaginosis) 12/15/2012  . Chest pain   . Diabetes (HCC)    borderline DM per patient - denies DM  . Eczema   . GERD (gastroesophageal reflux disease)   . Headache(784.0)   . History of chlamydia   . History of gonorrhea   . Irregular heartbeat   . Left bundle branch block   . Shortness of breath   . Tachycardia   . Vaginal itching 12/15/2012  . Vaginal Pap smear, abnormal    f/u ok    Past Surgical History:  Procedure Laterality Date  . CERVICAL CONIZATION W/BX N/A 12/22/2018   Procedure: LASER ABLATION OF CERVIX;  Surgeon: Lazaro Arms, MD;  Location: [facility];  Service: Gynecology;  Laterality: N/A;  . DILATION AND CURETTAGE OF UTERUS N/A 11/22/2012   Procedure: SUCTION DILATATION AND CURETTAGE;  Surgeon: Lazaro Arms, MD;  Location: [facility];  Service: Gynecology;  Laterality: N/A;    FAMHx:  Family History  Problem Relation Age of Onset  . Prostate cancer Paternal Grandfather   . Breast cancer Paternal Grandmother   . Lung cancer Paternal Grandmother   . Lung cancer Maternal Grandfather   . Colon polyps Father   . Prostate cancer Father   . Healthy Brother   . Healthy Daughter   . Healthy Son   . Breast cancer Maternal Aunt        Multiple with Breast  Cancer  . Cervical cancer Maternal Aunt   . Stroke Maternal Aunt   . Hypertension Other   . Diabetes Other   . Other Other        stomach tumors  . Heart disease Other        her mom, gma and aunts have arrythmia  . Kidney disease Paternal Aunt   . Colon cancer Paternal Aunt        colon problems  . Stroke Paternal Uncle        PGreatUncle  . Prostate cancer Paternal Uncle   . Breast cancer Cousin   . Esophageal cancer Neg Hx   . Rectal cancer Neg Hx   . Stomach cancer Neg Hx     SOCHx:   reports that she has never smoked. She has never used smokeless tobacco. She reports previous alcohol use. She reports that she does not use drugs.  ALLERGIES:  Allergies  Allergen Reactions  . Vicodin  [Hydrocodone-Acetaminophen] Other (See Comments)    violent  . Percocet [Oxycodone-Acetaminophen]     Violent behaviors  . Latex Itching and Rash    ROS: Pertinent items noted in HPI and remainder of comprehensive ROS otherwise negative.  HOME MEDS: Current Outpatient Medications on File Prior to Visit  Medication Sig Dispense Refill  . atorvastatin (LIPITOR) 40 MG tablet Take 1 tablet (40 mg total) by mouth daily. 90 tablet 3  . levonorgestrel (MIRENA) 20 MCG/24HR IUD 1 each by Intrauterine route once.    . Melatonin 10 MG TABS Take 1 tablet by mouth at bedtime as needed.    . Vitamin D, Ergocalciferol, (DRISDOL) 1.25 MG (50000 UNIT) CAPS capsule Take 1 capsule (50,000 Units total) by mouth once a week. 4 capsule 0   No current facility-administered medications on file prior to visit.    LABS/IMAGING: No results found for this or any previous visit (from the past 48 hour(s)). No results found.  LIPID PANEL:    Component Value Date/Time   CHOL 247 (H) 06/13/2020 1151   TRIG 45 06/13/2020 1151   HDL 60 06/13/2020 1151   CHOLHDL 3.0 10/22/2018 1140   LDLCALC 180 (H) 06/13/2020 1151   LDLCALC 121 (H) 10/22/2018 1140   LDLDIRECT 112 (H) 09/10/2011 0952     WEIGHTS: Wt Readings from Last 3 Encounters:  07/19/20 194 lb (88 kg)  07/16/20 188 lb (85.3 kg)  07/05/20 188 lb (85.3 kg)    VITALS: BP 104/80   Pulse 69   Ht 5' 2.25" (1.581 m)   Wt 194 lb (88 kg)   SpO2 99%   BMI 35.20 kg/m   EXAM: General appearance: alert and no distress Neck: no carotid bruit, no JVD and thyroid not enlarged, symmetric, no tenderness/mass/nodules Lungs: clear to auscultation bilaterally Heart: regular rate and rhythm, S1, S2 normal, no murmur, click, rub or gallop Abdomen: soft, non-tender; bowel sounds normal; no masses,  no organomegaly Extremities: extremities normal, atraumatic, no cyanosis or edema Pulses: 2+ and symmetric Skin: Skin color, texture, turgor normal. No rashes or  lesions Neurologic: Grossly normal Psych: Pleasant  EKG: Sinus rhythm with short PR at 69, LBBB-personally reviewed  ASSESSMENT: 1. Chest pain/dyspnea 2. Low normal LVEF 50 to 55% (10/2019 echo) 3. Low risk myoview, LVEF 61% (04/2020) 4. LBBB 5. Recent vomiting/GERD 6. Sinus tachycardia 7. Marked dyslipidemia  PLAN: 1.   Ms. [name] continues to struggle with chest discomfort and dyspnea.  The thought is it might be related to hiatal hernia.  She  is post to have esophageal manometry in the near future.  She had both a low risk Myoview which showed normal LVEF and prior to that low normal LVEF on echo.  Heart rate is improved somewhat.  She has a stable left bundle branch block which had been present since 2014.  There was a question on her echocardiogram about thinning of a cardiac wall concerning for possible sarcoidosis.  Cardiac MRI was suggested.  I think given her persistent symptoms it may be reasonable to pursue this.  She says her tachycardia typically is associated with exertion.  We will plan a cardiac MRI to further evaluate for possible infiltrative disease such as sarcoidosis.  Plan follow-up afterwards  Chrystie Nose, MD, Milagros Loll  [place]  [facility]  Medical Director of the [facility]  [facility] Diplomate of the American Board of Clinical Lipidology Attending Cardiologist  Direct Dial: [phone]  Fax: [phone]  Website:  [email or web address] 07/19/2020, 8:13 AM

## 2020-07-20 ENCOUNTER — Other Ambulatory Visit (HOSPITAL_COMMUNITY): Payer: No Typology Code available for payment source

## 2020-07-21 ENCOUNTER — Other Ambulatory Visit (HOSPITAL_COMMUNITY)
Admission: RE | Admit: 2020-07-21 | Discharge: 2020-07-21 | Disposition: A | Discharge status: Home / Self Care | Payer: No Typology Code available for payment source | Source: Ambulatory Visit | Attending: Gastroenterology | Admitting: Gastroenterology

## 2020-07-21 DIAGNOSIS — Z20822 Contact with and (suspected) exposure to covid-19: Secondary | ICD-10-CM | POA: Insufficient documentation

## 2020-07-21 DIAGNOSIS — Z01812 Encounter for preprocedural laboratory examination: Secondary | ICD-10-CM | POA: Diagnosis not present

## 2020-07-21 LAB — SARS CORONAVIRUS 2 (TAT 6-24 HRS): SARS Coronavirus 2: NEGATIVE

## 2020-07-25 ENCOUNTER — Ambulatory Visit (HOSPITAL_COMMUNITY)
Admission: RE | Admit: 2020-07-25 | Discharge: 2020-07-25 | Disposition: A | Discharge status: Home / Self Care | Payer: No Typology Code available for payment source | Attending: Gastroenterology | Admitting: Gastroenterology

## 2020-07-25 ENCOUNTER — Encounter (HOSPITAL_COMMUNITY)
Admission: RE | Disposition: A | Discharge status: Home / Self Care | Payer: Self-pay | Source: Home / Self Care | Attending: Gastroenterology

## 2020-07-25 DIAGNOSIS — R059 Cough, unspecified: Secondary | ICD-10-CM

## 2020-07-25 DIAGNOSIS — R142 Eructation: Secondary | ICD-10-CM | POA: Diagnosis not present

## 2020-07-25 DIAGNOSIS — R1110 Vomiting, unspecified: Secondary | ICD-10-CM | POA: Diagnosis not present

## 2020-07-25 DIAGNOSIS — K219 Gastro-esophageal reflux disease without esophagitis: Secondary | ICD-10-CM | POA: Diagnosis not present

## 2020-07-25 HISTORY — PX: 24 HOUR PH STUDY: SHX5419

## 2020-07-25 HISTORY — PX: ESOPHAGEAL MANOMETRY: SHX5429

## 2020-07-25 SURGERY — MANOMETRY, ESOPHAGUS

## 2020-07-25 MED ORDER — LIDOCAINE VISCOUS HCL 2 % MT SOLN
OROMUCOSAL | Status: AC
  Filled 2020-07-25: qty 15

## 2020-07-25 SURGICAL SUPPLY — 2 items
FACESHIELD LNG OPTICON STERILE (SAFETY) IMPLANT
GLOVE BIO SURGEON STRL SZ8 (GLOVE) ×4 IMPLANT

## 2020-07-25 NOTE — Progress Notes (Signed)
Esophageal manometry performed per protocol.  Patient tolerated procedure without any diffuculties.  PH probe then placed at 30 cm at left nare.  Written and verbal education provided on use of equipment and when to return to Endoscopy to have probe removed.  Patient verbalized understanding of all instructions.  Report to be sent to Dr. Kavitha Nandigam.    

## 2020-07-26 ENCOUNTER — Encounter (HOSPITAL_COMMUNITY): Payer: Self-pay | Admitting: Gastroenterology

## 2020-07-31 ENCOUNTER — Other Ambulatory Visit: Payer: Self-pay

## 2020-07-31 ENCOUNTER — Ambulatory Visit (INDEPENDENT_AMBULATORY_CARE_PROVIDER_SITE_OTHER): Payer: No Typology Code available for payment source | Admitting: Physician Assistant

## 2020-07-31 ENCOUNTER — Other Ambulatory Visit (INDEPENDENT_AMBULATORY_CARE_PROVIDER_SITE_OTHER): Payer: Self-pay | Admitting: Physician Assistant

## 2020-07-31 ENCOUNTER — Encounter (INDEPENDENT_AMBULATORY_CARE_PROVIDER_SITE_OTHER): Payer: Self-pay | Admitting: Physician Assistant

## 2020-07-31 ENCOUNTER — Telehealth: Payer: Self-pay | Admitting: Internal Medicine

## 2020-07-31 VITALS — BP 91/65 | HR 77 | Temp 97.7°F | Ht 62.0 in | Wt 187.0 lb

## 2020-07-31 DIAGNOSIS — Z9189 Other specified personal risk factors, not elsewhere classified: Secondary | ICD-10-CM

## 2020-07-31 DIAGNOSIS — E559 Vitamin D deficiency, unspecified: Secondary | ICD-10-CM

## 2020-07-31 DIAGNOSIS — R7303 Prediabetes: Secondary | ICD-10-CM | POA: Diagnosis not present

## 2020-07-31 DIAGNOSIS — Z6838 Body mass index (BMI) 38.0-38.9, adult: Secondary | ICD-10-CM | POA: Diagnosis not present

## 2020-07-31 MED ORDER — VITAMIN D (ERGOCALCIFEROL) 1.25 MG (50000 UNIT) PO CAPS: 50000.0000 [IU] | ORAL_CAPSULE | ORAL | 0 refills | Status: DC

## 2020-07-31 MED FILL — VIT D2 1.25 MG (50,000 UNIT: 1.25 MG | 28 days supply | Qty: 4 | Fill #0

## 2020-07-31 NOTE — Telephone Encounter (Signed)
Patient is calling to reschedule her MR CARD MORPHOLOGY WO/W CM. Please call to reschedule

## 2020-08-01 ENCOUNTER — Encounter: Payer: Self-pay | Admitting: Internal Medicine

## 2020-08-01 NOTE — Telephone Encounter (Signed)
Left message regarding the Friday 09/14/20 9:00 am Cardiac MRI appointment at [facility]---arrival time is 8:30 am --1st floor admissions office ---will mail information to patient and it is available in My Chart.

## 2020-08-02 NOTE — Progress Notes (Signed)
Chief Complaint:   OBESITY Candace [name] is here to discuss her progress with her obesity treatment plan along with follow-up of her obesity related diagnoses. Candace [name] is on the Category 2 Plan and states she is following her eating plan approximately 90% of the time. Candace [name] states she is doing 0 minutes 0 times per week.  Today's visit was #: 10 Starting weight: 201 lbs Starting date: 02/23/2020 Today's weight: 187 lbs Today's date: 07/31/2020 Total lbs lost to date: 14 Total lbs lost since last in-office visit: 1  Interim History: Candace [name] saw Dr. Rennis Golden recently and he wants her to have an MRI of the heart. Overall she feels good. She is not eating enough protein and she is not weighing her protein.  Subjective:   1. Vitamin D deficiency Candace [name] is on Vit D, and she is tolerating it well.  2. Pre-diabetes Candace [name]'s last A1c was 5.6. She is not on medications, and she denies polyphagia. She can not exercise per her Cardiologist.  3. At risk for diabetes mellitus Candace [name] is at higher than average risk for developing diabetes due to obesity.   Assessment/Plan:   1. Vitamin D deficiency Low Vitamin D level contributes to fatigue and are associated with obesity, breast, and colon cancer. We will refill prescription Vitamin D for 1 month. Nesreen will follow-up for routine testing of Vitamin D, at least 2-3 times per year to avoid over-replacement.  - Vitamin D, Ergocalciferol, (DRISDOL) 1.25 MG (50000 UNIT) CAPS capsule; Take 1 capsule (50,000 Units total) by mouth once a week.  Dispense: 4 capsule; Refill: 0  2. Pre-diabetes Candace [name] will continue her meal plan, and will continue to work on weight loss, exercise, and decreasing simple carbohydrates to help decrease the risk of diabetes.   3. At risk for diabetes mellitus Candace [name] was given approximately 15 minutes of diabetes education and counseling today. We discussed intensive lifestyle modifications today with an  emphasis on weight loss as well as increasing exercise and decreasing simple carbohydrates in her diet. We also reviewed medication options with an emphasis on risk versus benefit of those discussed.   Repetitive spaced learning was employed today to elicit superior memory formation and behavioral change.  4. Class 2 severe obesity due to excess calories with serious comorbidity and body mass index (BMI) of 38.0 to 38.9 in adult Candace [name]) Candace [name] is currently in the action stage of change. As such, her goal is to continue with weight loss efforts. She has agreed to the Category 2 Plan.   Exercise goals: No exercise has been prescribed at this time.  Behavioral modification strategies: meal planning and cooking strategies and keeping healthy foods in the home.  Candace [name] has agreed to follow-up with our clinic in 2 weeks. She was informed of the importance of frequent follow-up visits to maximize her success with intensive lifestyle modifications for her multiple health conditions.   Objective:   Blood pressure 91/65, pulse 77, temperature 97.7 F (36.5 C), height 5\' 2"  (1.575 m), weight 187 lb (84.8 kg), SpO2 100 %. Body mass index is 34.2 kg/m.  General: Cooperative, alert, well developed, in no acute distress. HEENT: Conjunctivae and lids unremarkable. Cardiovascular: Regular rhythm.  Lungs: Normal work of breathing. Neurologic: No focal deficits.   Lab Results  Component Value Date   CREATININE 0.93 06/13/2020   BUN 12 06/13/2020   NA 140 06/13/2020   K 4.2 06/13/2020   CL 105 06/13/2020   CO2 22 06/13/2020   Lab Results  Component  Value Date   ALT 15 06/13/2020   AST 15 06/13/2020   ALKPHOS 65 06/13/2020   BILITOT 1.0 06/13/2020   Lab Results  Component Value Date   HGBA1C 5.6 06/13/2020   HGBA1C 5.7 (H) 02/23/2020   HGBA1C 5.6 04/05/2012   Lab Results  Component Value Date   INSULIN 5.1 06/13/2020   INSULIN 10.8 02/23/2020   Lab Results  Component Value Date    TSH 0.998 12/07/2019   Lab Results  Component Value Date   CHOL 247 (H) 06/13/2020   HDL 60 06/13/2020   LDLCALC 180 (H) 06/13/2020   LDLDIRECT 112 (H) 09/10/2011   TRIG 45 06/13/2020   CHOLHDL 3.0 10/22/2018   Lab Results  Component Value Date   WBC 5.0 02/23/2020   HGB 12.6 02/23/2020   HCT 39.2 02/23/2020   MCV 88 02/23/2020   PLT 238 02/23/2020   Lab Results  Component Value Date   IRON 44 09/10/2011   TIBC 391 09/10/2011   Attestation Statements:   Reviewed by clinician on day of visit: allergies, medications, problem list, medical history, surgical history, family history, social history, and previous encounter notes.   Trude Mcburney, am acting as transcriptionist for [employer], PA-C.  I have reviewed the above documentation for accuracy and completeness, and I agree with the above. Alois Cliche, PA-C

## 2020-08-06 ENCOUNTER — Encounter (HOSPITAL_COMMUNITY): Payer: Self-pay | Admitting: Emergency Medicine

## 2020-08-06 ENCOUNTER — Emergency Department (HOSPITAL_COMMUNITY): Payer: No Typology Code available for payment source

## 2020-08-06 ENCOUNTER — Emergency Department (HOSPITAL_COMMUNITY)
Admission: EM | Admit: 2020-08-06 | Discharge: 2020-08-07 | Disposition: A | Discharge status: Home / Self Care | Payer: No Typology Code available for payment source | Attending: Emergency Medicine | Admitting: Emergency Medicine

## 2020-08-06 ENCOUNTER — Other Ambulatory Visit: Payer: Self-pay

## 2020-08-06 DIAGNOSIS — R0602 Shortness of breath: Secondary | ICD-10-CM | POA: Insufficient documentation

## 2020-08-06 DIAGNOSIS — R000 Tachycardia, unspecified: Secondary | ICD-10-CM | POA: Diagnosis not present

## 2020-08-06 DIAGNOSIS — Z79899 Other long term (current) drug therapy: Secondary | ICD-10-CM | POA: Diagnosis not present

## 2020-08-06 DIAGNOSIS — R002 Palpitations: Secondary | ICD-10-CM | POA: Diagnosis present

## 2020-08-06 LAB — BASIC METABOLIC PANEL
Anion gap: 6 (ref 5–15)
BUN: 13 mg/dL (ref 6–20)
CO2: 24 mmol/L (ref 22–32)
Calcium: 9 mg/dL (ref 8.9–10.3)
Chloride: 106 mmol/L (ref 98–111)
Creatinine, Ser: 1.06 mg/dL — ABNORMAL HIGH (ref 0.44–1.00)
GFR, Estimated: >60 mL/min (ref >60)
Glucose, Bld: 89 mg/dL (ref 70–99)
Potassium: 3.5 mmol/L (ref 3.5–5.1)
Sodium: 136 mmol/L (ref 135–145)

## 2020-08-06 LAB — CBC
HCT: 37.4 % (ref 36.0–46.0)
Hemoglobin: 12.1 g/dL (ref 12.0–15.0)
MCH: 28.8 pg (ref 26.0–34.0)
MCHC: 32.4 g/dL (ref 30.0–36.0)
MCV: 89 fL (ref 80.0–100.0)
Platelets: 255 10*3/uL (ref 150–400)
RBC: 4.2 MIL/uL (ref 3.87–5.11)
RDW: 14.1 % (ref 11.5–15.5)
WBC: 7.1 10*3/uL (ref 4.0–10.5)
nRBC: 0 % (ref 0.0–0.2)

## 2020-08-06 LAB — I-STAT BETA HCG BLOOD, ED (MC, WL, AP ONLY): I-stat hCG, quantitative: <5 m[IU]/mL (ref <5)

## 2020-08-06 LAB — TROPONIN I (HIGH SENSITIVITY)
Troponin I (High Sensitivity): 3 ng/L (ref <18)
Troponin I (High Sensitivity): 3 ng/L (ref <18)

## 2020-08-06 NOTE — ED Triage Notes (Signed)
Patient reports palpitations with mild SOB this afternoon , denies chest pain , no cough or fever , her cardiologist is Dr. Rennis Golden.

## 2020-08-06 NOTE — Telephone Encounter (Signed)
Left a message for the pt to call back re: her My Chart message.  

## 2020-08-07 DIAGNOSIS — R059 Cough, unspecified: Secondary | ICD-10-CM

## 2020-08-07 DIAGNOSIS — R1110 Vomiting, unspecified: Secondary | ICD-10-CM

## 2020-08-07 DIAGNOSIS — K219 Gastro-esophageal reflux disease without esophagitis: Secondary | ICD-10-CM

## 2020-08-07 NOTE — Discharge Instructions (Addendum)
Thank you for allowing me to care for you today in the Emergency Department.   Please follow up with Dr. Rennis Golden.  Return to the emergency department if you pass out, if you develop respiratory distress, chest pain with severe shortness of breath, sweating, pain that is worse with exertion, new numbness or weakness, or other new, concerning symptoms.

## 2020-08-07 NOTE — ED Provider Notes (Signed)
[facility] EMERGENCY DEPARTMENT Provider Note   CSN: [id number] Arrival date & time: 08/06/20  1952     History Chief Complaint  Patient presents with  . Palpitations    Candace [name] is a 35 y.o. female with a history of GERD, left bundle branch block who presents the emergency department with a chief complaint of palpitations.  The patient reports that she was at work earlier today when her heart suddenly began racing.  She checked her pulse and it was ranging from the 120s to the 140s.  She reports associated shortness of breath with the episode.  No known aggravating or alleviating factors.  Reports that she has had previous episodes of similar symptoms, but her maximum heart rate was previously in the 130s.  She contacted her cardiologist and was advised to come to the ER for further evaluation.  She denies chest pain, dizziness, lightheadedness, neck pain, arm pain, diaphoresis, syncope, abdominal pain, nausea, vomiting, diarrhea, visual changes, leg pain or swelling, orthopnea, fever, chills, cough, or URI symptoms.  On my evaluation, the episode has since resolved, she is asymptomatic at this time.  She has a known history of a left bundle branch block.  She has recently had a stress test and echo with cardiology.  She is pending a cardiac MRI in early May.  Reports that she was trialed on metoprolol for a similar symptoms previously, but was unable to tolerate the medication due to drops in her blood pressure.  No recent long travel or surgery.  No personal or familial history of VTE.  She has an IUD and does not take OCPs for birth control.  The history is provided by the patient and medical records. No language interpreter was used.       Past Medical History:  Diagnosis Date  . Allergy   . Anemia   . BV (bacterial vaginosis) 12/15/2012  . Chest pain   . Diabetes (HCC)    borderline DM per patient - denies DM  . Eczema   . GERD (gastroesophageal  reflux disease)   . Headache(784.0)   . History of chlamydia   . History of gonorrhea   . Irregular heartbeat   . Left bundle branch block   . Shortness of breath   . Tachycardia   . Vaginal itching 12/15/2012  . Vaginal Pap smear, abnormal    f/u ok    Patient Active Problem List   Diagnosis Date Noted  . History of abnormal cervical Pap smear 06/13/2019  . Encounter for gynecological examination with Papanicolaou smear of cervix 06/13/2019  . IUD (intrauterine device) in place 06/13/2019  . Encounter for insertion of mirena IUD 01/28/2019  . Prediabetes   . Abnormal Pap smear of cervix 11/01/2013  . Hyperlipidemia 10/17/2011  . Iron deficiency anemia 09/10/2011  . Eczema 09/10/2011  . Seasonal allergies 09/10/2011  . Obesity 09/10/2011    Past Surgical History:  Procedure Laterality Date  . 24 HOUR PH STUDY N/A 07/25/2020   Procedure: 24 HOUR PH STUDY;  Surgeon: Shellia Cleverly, DO;  Location: [facility];  Service: Gastroenterology;  Laterality: N/A;  . CERVICAL CONIZATION W/BX N/A 12/22/2018   Procedure: LASER ABLATION OF CERVIX;  Surgeon: Lazaro Arms, MD;  Location: [facility];  Service: Gynecology;  Laterality: N/A;  . DILATION AND CURETTAGE OF UTERUS N/A 11/22/2012   Procedure: SUCTION DILATATION AND CURETTAGE;  Surgeon: Lazaro Arms, MD;  Location: [facility];  Service: Gynecology;  Laterality: N/A;  .  ESOPHAGEAL MANOMETRY N/A 07/25/2020   Procedure: ESOPHAGEAL MANOMETRY (EM);  Surgeon: Shellia Cleverly, DO;  Location: [facility];  Service: Gastroenterology;  Laterality: N/A;     OB History    Gravida  3   Para  2   Term  2   Preterm      AB  1   Living  2     SAB  1   IAB      Ectopic      Multiple      Live Births  2           Family History  Problem Relation Age of Onset  . Prostate cancer Paternal Grandfather   . Breast cancer Paternal Grandmother   . Lung cancer Paternal Grandmother   . Lung cancer Maternal Grandfather   . Colon  polyps Father   . Prostate cancer Father   . Healthy Brother   . Healthy Daughter   . Healthy Son   . Breast cancer Maternal Aunt        Multiple with Breast Cancer  . Cervical cancer Maternal Aunt   . Stroke Maternal Aunt   . Hypertension Other   . Diabetes Other   . Other Other        stomach tumors  . Heart disease Other        her mom, gma and aunts have arrythmia  . Kidney disease Paternal Aunt   . Colon cancer Paternal Aunt        colon problems  . Stroke Paternal Uncle        PGreatUncle  . Prostate cancer Paternal Uncle   . Breast cancer Cousin   . Esophageal cancer Neg Hx   . Rectal cancer Neg Hx   . Stomach cancer Neg Hx     Social History   Tobacco Use  . Smoking status: Never Smoker  . Smokeless tobacco: Never Used  Vaping Use  . Vaping Use: Never used  Substance Use Topics  . Alcohol use: Not Currently    Comment: socially   . Drug use: No    Home Medications Prior to Admission medications   Medication Sig Start Date End Date Taking? Authorizing Provider  atorvastatin (LIPITOR) 40 MG tablet Take 1 tablet (40 mg total) by mouth daily. 07/09/20 10/07/20  Chrystie Nose, MD  levonorgestrel (MIRENA) 20 MCG/24HR IUD 1 each by Intrauterine route once.    [provider]  Melatonin 10 MG TABS Take 1 tablet by mouth at bedtime as needed.    [provider]  Vitamin D, Ergocalciferol, (DRISDOL) 1.25 MG (50000 UNIT) CAPS capsule Take 1 capsule (50,000 Units total) by mouth once a week. 07/31/20   Alois Cliche, PA-C    Allergies    Vicodin [hydrocodone-acetaminophen], Percocet [oxycodone-acetaminophen], and Latex  Review of Systems   Review of Systems  Constitutional: Negative for activity change, chills and fever.  HENT: Negative for congestion and sore throat.   Respiratory: Positive for shortness of breath. Negative for apnea, cough, choking, chest tightness and wheezing.   Cardiovascular: Positive for palpitations. Negative for chest  pain and leg swelling.  Gastrointestinal: Negative for abdominal pain, constipation, diarrhea, nausea and vomiting.  Genitourinary: Negative for dysuria.  Musculoskeletal: Negative for back pain, myalgias, neck pain and neck stiffness.  Skin: Negative for rash.  Allergic/Immunologic: Negative for immunocompromised state.  Neurological: Negative for dizziness, seizures, syncope, weakness, numbness and headaches.  Psychiatric/Behavioral: Negative for confusion.    Physical Exam Updated  Vital Signs BP 113/76 (BP Location: Right Arm)   Pulse 68   Temp 98.2 F (36.8 C) (Oral)   Resp 18   Ht 5\' 2"  (1.575 m)   Wt 90 kg   LMP 08/03/2020   SpO2 99%   BMI 36.29 kg/m   Physical Exam Vitals and nursing note reviewed.  Constitutional:      General: She is not in acute distress.    Appearance: She is not ill-appearing, toxic-appearing or diaphoretic.  HENT:     Head: Normocephalic.  Eyes:     Conjunctiva/sclera: Conjunctivae normal.  Cardiovascular:     Rate and Rhythm: Normal rate and regular rhythm.     Heart sounds: No murmur heard. No friction rub. No gallop.   Pulmonary:     Effort: Pulmonary effort is normal. No respiratory distress.     Breath sounds: No stridor. No wheezing, rhonchi or rales.     Comments: Able to speak in complete, fluent sentences without increased work of breathing.  No reproducible tenderness palpation of the chest wall.  Peripheral pulses are 2+ and symmetric. Chest:     Chest wall: No tenderness.  Abdominal:     General: There is no distension.     Palpations: Abdomen is soft. There is no mass.     Tenderness: There is no abdominal tenderness. There is no right CVA tenderness, left CVA tenderness, guarding or rebound.     Hernia: No hernia is present.  Musculoskeletal:     Cervical back: Neck supple.  Skin:    General: Skin is warm.     Findings: No rash.  Neurological:     Mental Status: She is alert.  Psychiatric:        Behavior: Behavior  normal.     ED Results / Procedures / Treatments   Labs (all labs ordered are listed, but only abnormal results are displayed) Labs Reviewed  BASIC METABOLIC PANEL - Abnormal; Notable for the following components:      Result Value   Creatinine, Ser 1.06 (*)    All other components within normal limits  CBC  I-STAT BETA HCG BLOOD, ED ([facility], [facility], [facility] ONLY)  TROPONIN I (HIGH SENSITIVITY)  TROPONIN I (HIGH SENSITIVITY)    EKG EKG Interpretation  Date/Time:  Monday August 06 2020 20:10:54 EDT Ventricular Rate:  111 PR Interval:  94 QRS Duration: 130 QT Interval:  348 QTC Calculation: 473 R Axis:   58 Text Interpretation: Sinus tachycardia with short PR Left bundle branch block Abnormal ECG When compared with ECG of 10/14/2019, No significant change was found Confirmed by Dione BoozeGlick, David ([id number]) on 08/07/2020 2:17:57 AM   Radiology DG Chest 2 View  Result Date: 08/06/2020 CLINICAL DATA:  Cardiac palpitations and shortness of breath EXAM: CHEST - 2 VIEW COMPARISON:  10/14/2019 FINDINGS: The heart size and mediastinal contours are within normal limits. Both lungs are clear. The visualized skeletal structures are unremarkable. IMPRESSION: No active cardiopulmonary disease. Electronically Signed   By: Alcide CleverMark  Lukens M.D.   On: 08/06/2020 20:36    Procedures Procedures   Medications Ordered in ED Medications - No data to display  ED Course  I have reviewed the triage vital signs and the nursing notes.  Pertinent labs & imaging results that were available during my care of the patient were reviewed by me and considered in my medical decision making (see chart for details).    MDM Rules/Calculators/A&P  35 year old female with a history of GERD, left bundle branch block who presents the emergency department with palpitations and shortness of breath.  Heart rate was noted to be in the 120s to 140s per the patient prior to arrival.  She reports previous episodes of  similar, but previously her maximum heart rate was in the 130s per the patient.  Initially tachycardic in the 110s on arrival.  Vital signs are now within normal limits.  The patient was discussed with Dr. Preston Fleeting, attending physician.  Labs and imaging of been reviewed and independently interpreted by me.  EKG with tachycardia and short PR interval with left bundle branch block, unchanged from previous.  Delta troponin is not elevated.  Symptoms are very atypical for ACS.  She has no metabolic derangements.  CBC is unremarkable.  Chest x-ray is unremarkable.  Could consider intermittent SVT or paroxysmal atrial fibrillation as the etiology of her symptoms.  She has also currently undergoing continued work-up for possible sarcoidosis and is pending a cardiac MRI in May 2022.  I did strongly consider PE, but patient has had no chest pain.  She did have a negative PE study during an ER visit last June as well as a negative venous duplex ultrasound.  After discussing with Dr. Preston Fleeting, her only risk factor as her elevated heart rate.  She is now asymptomatic at this time.  The patient does not wish to undergo any additional radiation at this time, and I think that that is reasonable given negative work-ups with previous presentations.  Also have a low suspicion for aortic dissection, esophageal rupture, tension pneumothorax, pericarditis, myocarditis.  At this time, the patient is hemodynamically stable and in no acute distress.  Recommended close follow-up with cardiology.  She may benefit from a Zio patch.  She was encouraged to keep her follow-up appointment for her cardiac MRI.  ER return precautions given.  Safe for discharge home with outpatient follow-up as indicated.  Final Clinical Impression(s) / ED Diagnoses Final diagnoses:  Tachycardia  Shortness of breath    Rx / DC Orders ED Discharge Orders    None       Barkley Boards, PA-C 08/07/20 0542    Dione Booze, MD 08/07/20 [id number]

## 2020-08-08 ENCOUNTER — Telehealth: Payer: Self-pay | Admitting: Gastroenterology

## 2020-08-08 NOTE — Telephone Encounter (Signed)
Esophageal Manometry and pH/impedance testing (off PPI) recently completed and notable for the following:  Esophageal Manometry: -Low resting EGJ pressure (9) but with complete (normal) relaxation and normal IRP -Normal esophageal motility and complete bolus clearance on 10/10 swallows  pH/impedance: -Normal esophageal acid exposure with pH <4 3.5% of the time (normal <4.2%) -DeMeester score 14.6 (normal <14.72) -Elevated number of reflux events (346 episodes), with 148 episodes traveling to the proximal esophagus.  Reflux events were predominantly weakly acidic (288 weakly acidic episodes, 62 acidic episodes) -No symptom correlation for cough, regurgitation, belching based on SAP, but regurgitation 75% and cough/belch both 50% based on SI  Overall impression of pH/impedance: -Evidence of increased supine gastroesophageal acid reflux -Increased weakly acidic reflux episodes  Recommendation: -Schedule follow-up appoint with me in the office to discuss ongoing medical management vs possible antireflux surgery options -Continue practicing diaphragmatic breathing

## 2020-08-09 NOTE — Telephone Encounter (Signed)
Another MyChart message was sent by patient and forwarded to MD to review

## 2020-08-10 ENCOUNTER — Other Ambulatory Visit: Payer: Self-pay

## 2020-08-10 ENCOUNTER — Ambulatory Visit (INDEPENDENT_AMBULATORY_CARE_PROVIDER_SITE_OTHER): Payer: No Typology Code available for payment source

## 2020-08-10 ENCOUNTER — Encounter: Payer: Self-pay | Admitting: Radiology

## 2020-08-10 ENCOUNTER — Ambulatory Visit (INDEPENDENT_AMBULATORY_CARE_PROVIDER_SITE_OTHER): Payer: No Typology Code available for payment source | Admitting: Student

## 2020-08-10 ENCOUNTER — Encounter: Payer: Self-pay | Admitting: Student

## 2020-08-10 ENCOUNTER — Other Ambulatory Visit: Payer: Self-pay | Admitting: Student

## 2020-08-10 VITALS — BP 110/72 | HR 79 | Ht 62.25 in | Wt 198.0 lb

## 2020-08-10 DIAGNOSIS — R002 Palpitations: Secondary | ICD-10-CM

## 2020-08-10 DIAGNOSIS — R079 Chest pain, unspecified: Secondary | ICD-10-CM | POA: Diagnosis not present

## 2020-08-10 DIAGNOSIS — I447 Left bundle-branch block, unspecified: Secondary | ICD-10-CM | POA: Diagnosis not present

## 2020-08-10 DIAGNOSIS — E785 Hyperlipidemia, unspecified: Secondary | ICD-10-CM

## 2020-08-10 MED ORDER — METOPROLOL TARTRATE 25 MG PO TABS: 12.5000 mg | ORAL_TABLET | ORAL | 1 refills | Status: DC | PRN

## 2020-08-10 NOTE — Progress Notes (Signed)
Enrolled patient for a 14 Day Zio XT Monitor to be mailed to patients home.

## 2020-08-10 NOTE — Telephone Encounter (Signed)
Called patient and offered OV today with [facility] PA @ 1:45pm She will get back to me on this

## 2020-08-10 NOTE — Patient Instructions (Signed)
Medication Instructions:  Metoprolol Tartrate 25 mg (0.5 Tablet as needed for Sustained Palpitations) *If you need a refill on your cardiac medications before your next appointment, please call your pharmacy*   Lab Work: [place] If you have labs (blood work) drawn today and your tests are completely normal, you will receive your results only by: Marland Kitchen MyChart Message (if you have MyChart) OR . A paper copy in the mail If you have any lab test that is abnormal or we need to change your treatment, we will call you to review the results.   Testing/Procedures: ZIO AT Long term monitor-Live Telemetry  Your physician has requested you wear a ZIO patch monitor for 14 days.  This is a single patch monitor. [employer] supplies one patch monitor per enrollment. Additional stickers are not available.  Please do not apply patch if you will be having a Nuclear Stress Test, Echocardiogram, Cardiac CT, MRI, or Chest Xray during the time frame you would be wearing the monitor. The patch cannot be worn during these tests. You cannot remove and re-apply the ZIO AT patch monitor.   Your ZIO patch monitor will be sent Fed Ex from [employer] directly to your home address. The monitor may also be mailed to a PO BOX if home delivery is not available. It may take 3-5 days to receive your monitor after you have been enrolled.  Once you have received you monitor, please review enclosed instructions. Your monitor has already been registered assigning a specific monitor serial # to you.   Applying the monitor  Shave hair from upper left chest.  Hold abrader disc by orange tab. Rub abrader in 40 strokes over left upper chest as indicated in your monitor instructions.  Clean area with 4 enclosed alcohol pads. Use all pads to ensure the area is cleaned thoroughly. Let dry.  Apply patch as indicated in monitor instructions. Patch will be placed under collarbone on left side of chest with arrow pointing upward.   Rub patch adhesive wings for 2 minutes. Remove the white label marked "1". Remove the white label marked "2". Rub patch adhesive wings for 2 additional minutes.  While looking in a mirror, press and release button in center of patch. A small green light will flash 3-4 times. This will be your only indicator the monitor has been turned on.  Do not shower for the first 24 hours. You may shower after the first 24 hours.  Press the button if you feel a symptom. You will hear a small click. Record Date, Time and Symptom in the Patient Log.   Starting the Gateway  In your kit there is a Hydrographic surveyor box the size of a cellphone. This is Airline pilot. It transmits all your recorded data to [facility]. This box must stay within 10 feet of you at all times. Open the box and push the * button. There will be a light that blinks orange and then green a few times. When the light stops blinking, the Gateway is connected to the ZIO patch.  Call [employer] at [phone] to confirm your monitor is transmitting.   Returning your monitor  Remove your patch and place it inside the [place]. In the lower half of the Gateway there is a white bag with prepaid postage on it. Place Gateway in bag and seal. Mail package back to [place] as soon as possible. Your physician should have your final report approximately 7 days after you have mailed back your monitor.   Call  [place] at [phone] if you have questions regarding your ZIO AT patch monitor. Call them immediately if you see an orange light blinking on your monitor.  If your monitor falls off in less than 4 days contact our Monitor department at [phone]. If your monitor becomes loose or falls off after 4 days call [employer] at [phone] for suggestions on securing your monitor.       Follow-Up: At [facility], you and your health needs are our priority.  As part of our continuing mission to provide you with exceptional heart  care, we have created designated Provider Care Teams.  These Care Teams include your primary Cardiologist (physician) and Advanced Practice Providers (APPs -  Physician Assistants and Nurse Practitioners) who all work together to provide you with the care you need, when you need it.  We recommend signing up for the patient portal called "MyChart".  Sign up information is provided on this After Visit Summary.  MyChart is used to connect with patients for Virtual Visits (Telemedicine).  Patients are able to view lab/test results, encounter notes, upcoming appointments, etc.  Non-urgent messages can be sent to your provider as well.   To learn more about what you can do with MyChart, go to [email or web address].    Your next appointment:   Sep 20, 2020 9:45 AM  The format for your next appointment:   In Person  Provider:   Raliegh Ip Mali Hilty, MD

## 2020-08-10 NOTE — Telephone Encounter (Signed)
I did not call Ryla.  There appears to be a result note from Dr. Barron Alvine regarding her recent pH and impedance testing with plans for follow-up.  - HD

## 2020-08-10 NOTE — Progress Notes (Signed)
Cardiology Office Note:    Date:  08/10/2020   ID:  Candace [name], DOB 04-Feb-1986, MRN [id number]  PCP:  Alycia Rossetti, MD  Cardiologist:  Pixie Casino, MD  Electrophysiologist:  None   Referring MD: Alycia Rossetti, MD   Chief Complaint: ED follow-up for palpitations   History of Present Illness:    Candace [name] is a 35 y.o. female with a history of chest pain with negative Myoview in 04/2020, LBBB, palpitations, hyperlipidemia, borderline diabetes, and GERD who is followed by Dr. Debara Pickett and presents today for ED follow-up for palpitations.   Patient initially seen by Dr. Debara Pickett in 10/2019 when she presented to the ED with chest pain. EKG was abnormal and showed LBBB which had been present dating back to 2014. High-sensitivity troponin negative.Work-up was unremarkable. or PND. She does have lower extremity edema after a 12 hour shift. Outpatient Echo was ordered and showed LVEF of 50-55% with hypokinesis of the basal septal segment and mild asymmetric LV hypertrophy of the inferolateral segment.  There was concern about thinning of the ventricular septum concerning for granulomatous process. She was started on Lopressor for tachycardia/palpitations but was unable to tolerate this due to low BP. She continued to have chest pain and shortness of breath and eventually underwent a Myoview in 04/2020 which was low risk with no evidence of ischemia or prior infarct. It was thought that patient's chest pain and dyspnea may be due to a hiatal hernia. However, given persistent symptoms, cardiac MRI was ordered at last visit with Dr. Debara Pickett on 07/19/2020 for further evaluation of possible infiltrative disease such as sarcoidosis.   Patient seen in the ED on 08/06/2020 for palpitations with heart rates in as high as the 140's. Otherwise, asymptomatic. EKG showed sinus tachycardia, rate 111 bpm, with short PR interval and LBBB. High-sensitivity troponin negative x2. CBC and BMET  unremarkable.  Patient here today for follow-up. 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 We discussed event that led to ED visit on 08/06/2020. She states she was walking down the hall on [address] on when all of a sudden she felt short of breath. Then noticed her heart racing. She checked her heart rate which was in the 140's and that is when she went to the ED. She denies any chest pain at the time but has had chest pain before during episodes of palpitations. She has not had any chest pain in the last couple of weeks. She has some occasional lightheadedness/dizziness - sometimes associated with palpitations but not always. No syncope. She states she has slept on a incline for a long time due to her GERD but no orthopnea. No PND. She has some lower extremity edema after she has been on her feet all day (works 12 hour shifts).  Of note, she did have her esophageal manometry.   Past Medical History:  Diagnosis Date  . Allergy   . Anemia   . BV (bacterial vaginosis) 12/15/2012  . Chest pain   . Diabetes ([place])    borderline DM per patient - denies DM  . Eczema   . GERD (gastroesophageal reflux disease)   . Headache(784.0)   . History of chlamydia   . History of gonorrhea   . Irregular heartbeat   . Left bundle branch block   . Shortness of breath   . Tachycardia   . Vaginal itching 12/15/2012  . Vaginal Pap smear, abnormal    f/u ok    Past Surgical  History:  Procedure Laterality Date  . [place] STUDY N/A 07/25/2020   Procedure: [place] STUDY;  Surgeon: Lavena Bullion, DO;  Location: [facility];  Service: Gastroenterology;  Laterality: N/A;  . CERVICAL CONIZATION W/BX N/A 12/22/2018   Procedure: LASER ABLATION OF CERVIX;  Surgeon: Florian Buff, MD;  Location: [facility];  Service: Gynecology;  Laterality: N/A;  . DILATION AND CURETTAGE OF UTERUS N/A 11/22/2012   Procedure: SUCTION DILATATION AND CURETTAGE;  Surgeon: Florian Buff, MD;  Location: [facility];  Service: Gynecology;  Laterality: N/A;   . ESOPHAGEAL MANOMETRY N/A 07/25/2020   Procedure: ESOPHAGEAL MANOMETRY (EM);  Surgeon: Lavena Bullion, DO;  Location: [facility];  Service: Gastroenterology;  Laterality: N/A;    Current Medications: Current Meds  Medication Sig  . atorvastatin (LIPITOR) 40 MG tablet Take 1 tablet (40 mg total) by mouth daily.  Marland Kitchen levonorgestrel (MIRENA) 20 MCG/24HR IUD 1 each by Intrauterine route once.  . metoprolol tartrate (LOPRESSOR) 25 MG tablet Take 0.5 tablets (12.5 mg total) by mouth as needed (As Needed for sustained palpitations).  . Vitamin D, Ergocalciferol, (DRISDOL) 1.25 MG (50000 UNIT) CAPS capsule Take 1 capsule (50,000 Units total) by mouth once a week.     Allergies:   Vicodin [hydrocodone-acetaminophen], Percocet [oxycodone-acetaminophen], and Latex   Social History   Socioeconomic History  . Marital status: Single    Spouse name: Not on file  . Number of children: 2  . Years of education: Not on file  . Highest education level: Some college, no degree  Occupational History  . Occupation: CNA    Comment: [employer]  Tobacco Use  . Smoking status: Never Smoker  . Smokeless tobacco: Never Used  Vaping Use  . Vaping Use: Never used  Substance and Sexual Activity  . Alcohol use: Not Currently    Comment: socially   . Drug use: No  . Sexual activity: Yes    Birth control/protection: I.U.D.  Other Topics Concern  . Not on file  Social History Narrative  . Not on file   Social Determinants of Health   Financial Resource Strain: Not on file  Food Insecurity: Not on file  Transportation Needs: Not on file  Physical Activity: Not on file  Stress: Not on file  Social Connections: Not on file     Family History: The patient's family history includes Breast cancer in her cousin, maternal aunt, and paternal grandmother; Cervical cancer in her maternal aunt; Colon cancer in her paternal aunt; Colon polyps in her father; Diabetes in an other family member; Healthy  in her brother, daughter, and son; Heart disease in an other family member; Hypertension in an other family member; Kidney disease in her paternal aunt; Lung cancer in her maternal grandfather and paternal grandmother; Other in an other family member; Prostate cancer in her father, paternal grandfather, and paternal uncle; Stroke in her maternal aunt and paternal uncle. There is no history of Esophageal cancer, Rectal cancer, or Stomach cancer.  ROS:   Please see the history of present illness.     EKGs/Labs/Other Studies Reviewed:    The following studies were reviewed today:  Echocardiogram 11/02/2019: Impressions: 1. Left ventricular ejection fraction, by estimation, is 50 to 55%. The  left ventricle has low normal function. The left ventricle demonstrates  regional wall motion abnormalities (see scoring diagram/findings for  description). There is mild asymmetric  left ventricular hypertrophy of the inferolateral segment. Tissue Doppler  low for age,  probable grossly normal diastolic function.  2. Right ventricular systolic function is normal. The right ventricular  size is normal. There is normal pulmonary artery systolic pressure. The  estimated right ventricular systolic pressure is 70.9 mmHg.  3. The mitral valve is normal in structure. Trivial mitral valve  regurgitation. No evidence of mitral stenosis.  4. The aortic valve is tricuspid. Aortic valve regurgitation is trivial.  No aortic stenosis is present.  5. The inferior vena cava is normal in size with greater than 50%  respiratory variability, suggesting right atrial pressure of 3 mmHg.   Conclusion(s)/Recommendation(s): Basal ventricular septal thinning and hypokinesis. In setting of abnormal ECG, consider cardiac MRI to evaluate for granulomatous process.  _______________  Carlton Adam Myoview 04/17/2020:  Nuclear stress EF: 61%.  There was no ST segment deviation noted during stress.  The study is normal.  The  left ventricular ejection fraction is normal (55-65%).   Normal pharmacologic nuclear study with no evidence for prior infarct or ischemia. Normal LVEF 61%.   EKG:  EKG ordered today. EKG personally reviewed and demonstrates normal sinus rhythm with sinus arrhythm, rate 79 bpm, with short PR interval and upsloping of QRS (possible delta wave) as well as LBBB. Non-specific ST/T changes. Normal axis. QTc 424 ms.  Recent Labs: 10/14/2019: B Natriuretic Peptide 24.4; Magnesium 1.9 12/07/2019: TSH 0.998 06/13/2020: ALT 15 08/06/2020: BUN 13; Creatinine, Ser 1.06; Hemoglobin 12.1; Platelets 255; Potassium 3.5; Sodium 136  Recent Lipid Panel    Component Value Date/Time   CHOL 247 (H) 06/13/2020 1151   TRIG 45 06/13/2020 1151   HDL 60 06/13/2020 1151   CHOLHDL 3.0 10/22/2018 1140   LDLCALC 180 (H) 06/13/2020 1151   LDLCALC 121 (H) 10/22/2018 1140   LDLDIRECT 112 (H) 09/10/2011 0952    Physical Exam:    Vital Signs: BP 110/72   Pulse 79   Ht 5' 2.25" (1.581 m)   Wt 198 lb (89.8 kg)   LMP 08/03/2020   SpO2 98%   BMI 35.92 kg/m     Wt Readings from Last 3 Encounters:  08/10/20 198 lb (89.8 kg)  08/06/20 198 lb 6.6 oz (90 kg)  07/31/20 187 lb (84.8 kg)     General: 35 y.o. African-American female in no acute distress. HEENT: Normocephalic and atraumatic. Sclera clear.  Neck: Supple. No carotid bruits. No JVD. Heart: Mildly tachycardic with normal rate. Distinct S1 and S2. No murmurs, gallops, or rubs. Radial pulses 2+ and equal bilaterally. Lungs: No increased work of breathing. Clear to ausculation bilaterally. No wheezes, rhonchi, or rales.  Abdomen: Soft, non-distended, and non-tender to palpation. Extremities: No lower extremity edema.    Skin: Warm and dry. Neuro: Alert and oriented x3. No focal deficits. Psych: Normal affect. Responds appropriately.  Assessment:    1. Palpitations   2. LBBB (left bundle branch block)   3. Chest pain of uncertain etiology   4.  Hyperlipidemia, unspecified hyperlipidemia type     Plan:    Palpitations LBBB Possible Preexcitation Pathway  - Patient has intermittent palpitations with associated shortness of breath and chest pain. Recently seen in the ED for an episode of tachycardia with rates as high as the 140's. Work-up was unremarkable. - EKG shows normal sinus rhythm with sinus arrhythm, rate 79 bpm, with short PR interval and upsloping of QRS (possible delta wave) as well as LBBB. Non-specific ST/T changes - Echo in 10/2019 showed LVEF of 50-55%. - Previously on Lopressor but was unable to tolerate due to low  BP. Has been instructed she can take as needed for sustained palpitations.  - Potassium at ED visit earlier this week was normal. Will check TSH and Magnesium.  - OK to continue to use Lopressor 12.36m as needed for sustained palpitations. - Will order 2 week Zio monitor (live) to rule out atrial fibrillation, SVT, or any other arrhythmias.  - EKG concerning for possible preexcitation with short PR interval and upsloping of QRS (possible delta wave). Will wait for monitor results and cardiac MRI results. Discussed with DOD (Dr. AMargaretann Loveless - may benefit from ETT to assess possible accessory pathway during stress. Patient has close follow-up with Dr. HDebara Pickettscheduled for next month so will defer this to him after results of MRI and monitor come back.  Chest Pain - She denies any chest pain in the last 3 weeks but has had chest pain with palpitations in the past.  - EKG shows no acute ST/T changes.  - Echo in 10/2019 showed  LVEF of 50-55% with hypokinesis of the basal septal segment and mild asymmetric LV hypertrophy of the inferolateral segment.  There was concern about thinning of the ventricular septum concerning for granulomatous process.  - Myoview in 04/2020 was low risk with no evidence of ischemia or infarction.  - Patient is schedule to have cardiac MRI next month for further evaluation of possible  infiltrative disease such as sarcoidosis.   Hyperlipidemia - Most recent lipid panel in 06/2020: Total cholesterol 247, Triglycerides 45, HDL 60, LDL 180.  - Started on Lipitor 435mat time of last check.  - Plan is for repeat labs in 09/2020.   Disposition: Patient already has follow-up with Dr. HiDebara Pickettcheduled for 09/20/2020.   Medication Adjustments/Labs and Tests Ordered: Current medicines are reviewed at length with the patient today.  Concerns regarding medicines are outlined above.  Orders Placed This Encounter  Procedures  . TSH  . Magnesium  . LONG TERM MONITOR (3-14 DAYS)   Meds ordered this encounter  Medications  . metoprolol tartrate (LOPRESSOR) 25 MG tablet    Sig: Take 0.5 tablets (12.5 mg total) by mouth as needed (As Needed for sustained palpitations).    Dispense:  30 tablet    Refill:  1    Patient Instructions  Medication Instructions:  Metoprolol Tartrate 25 mg (0.5 Tablet as needed for Sustained Palpitations) *If you need a refill on your cardiac medications before your next appointment, please call your pharmacy*   Lab Work: Ma[place]f you have labs (blood work) drawn today and your tests are completely normal, you will receive your results only by: . Marland KitchenyChart Message (if you have MyChart) OR . A paper copy in the mail If you have any lab test that is abnormal or we need to change your treatment, we will call you to review the results.   Testing/Procedures: ZIO AT Long term monitor-Live Telemetry  Your physician has requested you wear a ZIO patch monitor for 14 days.  This is a single patch monitor. [employer] supplies one patch monitor per enrollment. Additional stickers are not available.  Please do not apply patch if you will be having a Nuclear Stress Test, Echocardiogram, Cardiac CT, MRI, or Chest Xray during the time frame you would be wearing the monitor. The patch cannot be worn during these tests. You cannot remove and re-apply the ZIO AT  patch monitor.   Your ZIO patch monitor will be sent Fed Ex from [employer] to your home address. The monitor may also  be mailed to a PO BOX if home delivery is not available. It may take 3-5 days to receive your monitor after you have been enrolled.  Once you have received you monitor, please review enclosed instructions. Your monitor has already been registered assigning a specific monitor serial # to you.   Applying the monitor  Shave hair from upper left chest.  Hold abrader disc by orange tab. Rub abrader in 40 strokes over left upper chest as indicated in your monitor instructions.  Clean area with 4 enclosed alcohol pads. Use all pads to ensure the area is cleaned thoroughly. Let dry.  Apply patch as indicated in monitor instructions. Patch will be placed under collarbone on left side of chest with arrow pointing upward.  Rub patch adhesive wings for 2 minutes. Remove the white label marked "1". Remove the white label marked "2". Rub patch adhesive wings for 2 additional minutes.  While looking in a mirror, press and release button in center of patch. A small green light will flash 3-4 times. This will be your only indicator the monitor has been turned on.  Do not shower for the first 24 hours. You may shower after the first 24 hours.  Press the button if you feel a symptom. You will hear a small click. Record Date, Time and Symptom in the Patient Log.   Starting the Gateway  In your kit there is a Hydrographic surveyor box the size of a cellphone. This is Airline pilot. It transmits all your recorded data to [facility]. This box must stay within 10 feet of you at all times. Open the box and push the * button. There will be a light that blinks orange and then green a few times. When the light stops blinking, the Gateway is connected to the ZIO patch.  Call [employer] at [phone] to confirm your monitor is transmitting.   Returning your monitor  Remove your patch and place it inside  the [place]. In the lower half of the Gateway there is a white bag with prepaid postage on it. Place Gateway in bag and seal. Mail package back to [place] as soon as possible. Your physician should have your final report approximately 7 days after you have mailed back your monitor.   Call [place] at [phone] if you have questions regarding your ZIO AT patch monitor. Call them immediately if you see an orange light blinking on your monitor.  If your monitor falls off in less than 4 days contact our Monitor department at [phone]. If your monitor becomes loose or falls off after 4 days call [employer] at [phone] for suggestions on securing your monitor.       Follow-Up: At [facility], you and your health needs are our priority.  As part of our continuing mission to provide you with exceptional heart care, we have created designated Provider Care Teams.  These Care Teams include your primary Cardiologist (physician) and Advanced Practice Providers (APPs -  Physician Assistants and Nurse Practitioners) who all work together to provide you with the care you need, when you need it.  We recommend signing up for the patient portal called "MyChart".  Sign up information is provided on this After Visit Summary.  MyChart is used to connect with patients for Virtual Visits (Telemedicine).  Patients are able to view lab/test results, encounter notes, upcoming appointments, etc.  Non-urgent messages can be sent to your provider as well.   To learn more about what you can do  with MyChart, go to [email or web address].    Your next appointment:   Sep 20, 2020 9:45 AM  The format for your next appointment:   In Person  Provider:   K. Mali Hilty, MD       Signed, Darreld Mclean, PA-C  08/10/2020 5:10 PM    [place]

## 2020-08-10 NOTE — Telephone Encounter (Signed)
See alternate patient message. 

## 2020-08-11 LAB — TSH: TSH: 0.96 u[IU]/mL (ref 0.450–4.500)

## 2020-08-11 LAB — MAGNESIUM: Magnesium: 2 mg/dL (ref 1.6–2.3)

## 2020-08-13 NOTE — Telephone Encounter (Signed)
Spoke with patient and advised that Dr. Barron Alvine is out of the office today but I will get the message to him. Patient verbalized understanding and had no concerns at the end of the call.

## 2020-08-13 NOTE — Telephone Encounter (Signed)
Inbound call from patient stating she received a call from Dr. Barron Alvine and is requesting another call to discuss results.  Please advise.

## 2020-08-14 DIAGNOSIS — R002 Palpitations: Secondary | ICD-10-CM | POA: Diagnosis not present

## 2020-08-15 NOTE — Addendum Note (Signed)
Addended by: Myna Hidalgo A on: 08/15/2020 04:26 PM   Modules accepted: Orders

## 2020-08-15 NOTE — Telephone Encounter (Signed)
I spoke with the patient.  We discussed her recent Esophageal Manometry and pH/impedance results.  DeMeester score at the upper limit of normal, but also a significant number of refluxate events on impedance.  Discussed whether or not antireflux surgery could improve her symptomatology and obviate the need for ongoing acid suppression therapy.  Plan for the following:  -Referral to Dr. Andrey Campanile at [facility] for consideration of combined hiatal hernia repair/crural repair and TIF (cTIF) -Follow-up with me after she is seen by Dr. Andrey Campanile

## 2020-08-15 NOTE — Telephone Encounter (Signed)
Spoke wtih patient, she is aware that I am sending the referral to [facility] today. Advised to give them at least a week to give her a call to schedule an appt. Patient is aware that once she is scheduled with Dr. Andrey Campanile she will need to call and schedule a follow up with Dr. Barron Alvine. Patient verbalized understanding and had no concerns at the end of the call.   Referral, records, demographic and insurance information faxed to [facility].

## 2020-08-15 NOTE — Telephone Encounter (Signed)
Patient calling requesting a return call in regards to her results. Thanks

## 2020-08-20 ENCOUNTER — Ambulatory Visit (INDEPENDENT_AMBULATORY_CARE_PROVIDER_SITE_OTHER): Payer: No Typology Code available for payment source | Admitting: Family Medicine

## 2020-08-20 ENCOUNTER — Other Ambulatory Visit: Payer: Self-pay

## 2020-08-20 ENCOUNTER — Encounter (INDEPENDENT_AMBULATORY_CARE_PROVIDER_SITE_OTHER): Payer: Self-pay | Admitting: Family Medicine

## 2020-08-20 VITALS — BP 105/70 | HR 82 | Temp 98.3°F | Ht 62.0 in | Wt 187.0 lb

## 2020-08-20 DIAGNOSIS — Z6836 Body mass index (BMI) 36.0-36.9, adult: Secondary | ICD-10-CM | POA: Diagnosis not present

## 2020-08-20 DIAGNOSIS — R002 Palpitations: Secondary | ICD-10-CM | POA: Diagnosis not present

## 2020-08-29 NOTE — Progress Notes (Signed)
Chief Complaint:   OBESITY Candace [name] is here to discuss her progress with her obesity treatment plan along with follow-up of her obesity related diagnoses. Candace [name] is on the Category 2 Plan and states she is following her eating plan approximately 95% of the time. Candace [name] states she is walking for 60 minutes 3 times per week.  Today's visit was #: 11 Starting weight: 201 lbs Starting date: 02/23/2020 Today's weight: 187 lbs Today's date: 08/20/2020 Total lbs lost to date: 14 Total lbs lost since last in-office visit: 0  Interim History: Candace [name] has done well maintaining her weight. She is working on some health issues and it looks like she will be having a fundoplication done in the near future. She may need to do a liquid diet before then.   Subjective:   1. Palpitations Candace [name] has a ZIO monitor on for 2 weeks. She had a persistent heart rate of 140 and she is followed by Cardiology.  Assessment/Plan:   1. Palpitations Donita will avoid stimulants for now, and will continue to monitor.  2. Obesity with current BMI of 34.3 Candace [name] is currently in the action stage of change. As such, her goal is to continue with weight loss efforts. She has agreed to the Category 2 Plan.   Exercise goals: As is.  Behavioral modification strategies: increasing lean protein intake.  Candace [name] has agreed to follow-up with our clinic in 3 weeks. She was informed of the importance of frequent follow-up visits to maximize her success with intensive lifestyle modifications for her multiple health conditions.   Objective:   Blood pressure 105/70, pulse 82, temperature 98.3 F (36.8 C), height 5\' 2"  (1.575 m), weight 187 lb (84.8 kg), last menstrual period 08/03/2020, SpO2 100 %. Body mass index is 34.2 kg/m.  General: Cooperative, alert, well developed, in no acute distress. HEENT: Conjunctivae and lids unremarkable. Cardiovascular: Regular rhythm.  Lungs: Normal work of  breathing. Neurologic: No focal deficits.   Lab Results  Component Value Date   CREATININE 1.06 (H) 08/06/2020   BUN 13 08/06/2020   NA 136 08/06/2020   K 3.5 08/06/2020   CL 106 08/06/2020   CO2 24 08/06/2020   Lab Results  Component Value Date   ALT 15 06/13/2020   AST 15 06/13/2020   ALKPHOS 65 06/13/2020   BILITOT 1.0 06/13/2020   Lab Results  Component Value Date   HGBA1C 5.6 06/13/2020   HGBA1C 5.7 (H) 02/23/2020   HGBA1C 5.6 04/05/2012   Lab Results  Component Value Date   INSULIN 5.1 06/13/2020   INSULIN 10.8 02/23/2020   Lab Results  Component Value Date   TSH 0.960 08/10/2020   Lab Results  Component Value Date   CHOL 247 (H) 06/13/2020   HDL 60 06/13/2020   LDLCALC 180 (H) 06/13/2020   LDLDIRECT 112 (H) 09/10/2011   TRIG 45 06/13/2020   CHOLHDL 3.0 10/22/2018   Lab Results  Component Value Date   WBC 7.1 08/06/2020   HGB 12.1 08/06/2020   HCT 37.4 08/06/2020   MCV 89.0 08/06/2020   PLT 255 08/06/2020   Lab Results  Component Value Date   IRON 44 09/10/2011   TIBC 391 09/10/2011   Attestation Statements:   Reviewed by clinician on day of visit: allergies, medications, problem list, medical history, surgical history, family history, social history, and previous encounter notes.  Time spent on visit including pre-visit chart review and post-visit care and charting was 20 minutes.    11/10/2011  Hassell Done, am acting as Location manager for Dennard Nip, MD.  I have reviewed the above documentation for accuracy and completeness, and I agree with the above. -  Dennard Nip, MD

## 2020-09-04 ENCOUNTER — Other Ambulatory Visit: Payer: Self-pay

## 2020-09-04 DIAGNOSIS — I498 Other specified cardiac arrhythmias: Secondary | ICD-10-CM

## 2020-09-04 DIAGNOSIS — R000 Tachycardia, unspecified: Secondary | ICD-10-CM

## 2020-09-04 NOTE — Progress Notes (Signed)
Virtual Visit via Telephone Note  I connected with Candace [name], on 09/05/2020 at 11:02 AM by telephone due to the COVID-19 pandemic and verified that I am speaking with the correct person using two identifiers.  Due to current restrictions/limitations of in-office visits due to the COVID-19 pandemic, this scheduled clinical appointment was converted to a telehealth visit.   Consent: I discussed the limitations, risks, security and privacy concerns of performing an evaluation and management service by telephone and the availability of in person appointments. I also discussed with the patient that there may be a patient responsible charge related to this service. The patient expressed understanding and agreed to proceed.   Location of Patient: Home  Location of Provider: [facility]   Persons participating in Telemedicine visit: Simonne Maffucci, NP Margorie John, CMA   History of Present Illness: Candace [name] is a 35 year-old female who presents to establish care.  PMH significant for left bundle branch block, gastroesophageal reflux disease, diabetes, eczema, seasonal allergies, iron deficiency anemia, chest pain, headache, hyperlipidemia, and tachycardia.  Current issues and/or concerns: Followed by Gastroenterology. Reports scheduled for combined hiatal hernia repair/crural repair and TIF consultation soon.   Currently followed by Cardiology.    Past Medical History:  Diagnosis Date  . Allergy   . Anemia   . Anxiety   . BV (bacterial vaginosis) 12/15/2012  . Chest pain   . Diabetes (HCC)    borderline DM per patient - denies DM  . Eczema   . GERD (gastroesophageal reflux disease)   . Headache(784.0)   . History of chlamydia   . History of gonorrhea   . Hyperlipidemia    Phreesia 09/03/2020  . Irregular heartbeat   . Left bundle branch block   . Shortness of breath   . Tachycardia   . Vaginal itching 12/15/2012  .  Vaginal Pap smear, abnormal    f/u ok   Allergies  Allergen Reactions  . Vicodin [Hydrocodone-Acetaminophen] Other (See Comments)    violent  . Percocet [Oxycodone-Acetaminophen]     Violent behaviors  . Latex Itching and Rash  . Other     Current Outpatient Medications on File Prior to Visit  Medication Sig Dispense Refill  . atorvastatin (LIPITOR) 40 MG tablet TAKE 1 TABLET (40 MG TOTAL) BY MOUTH DAILY. 90 tablet 3  . levonorgestrel (MIRENA) 20 MCG/24HR IUD 1 each by Intrauterine route once.    . metoprolol tartrate (LOPRESSOR) 25 MG tablet TAKE 1/2 TABLETS BY MOUTH AS NEEDED FOR SUSTAINED PALPITATIONS. 30 tablet 1  . Vitamin D, Ergocalciferol, (DRISDOL) 1.25 MG (50000 UNIT) CAPS capsule TAKE 1 CAPSULE (50,000 UNITS TOTAL) BY MOUTH ONCE A WEEK. 4 capsule 0   No current facility-administered medications on file prior to visit.    Observations/Objective: Alert and oriented x 3. Not in acute distress. Physical examination not completed as this is a telemedicine visit.  Assessment and Plan: 1. Encounter to establish care: - Patient presents today to establish care.  - Return for annual physical examination, labs, and health maintenance. Arrive fasting meaning having no for at least 8 hours prior to appointment. You may have only water or black coffee. Please take scheduled medications as normal.   Follow Up Instructions: Return for annual physical exam.   Patient was given clear instructions to go to Emergency Department or return to medical center if symptoms don't improve, worsen, or new problems develop.The patient verbalized understanding.  I discussed  the assessment and treatment plan with the patient. The patient was provided an opportunity to ask questions and all were answered. The patient agreed with the plan and demonstrated an understanding of the instructions.   The patient was advised to call back or seek an in-person evaluation if the symptoms worsen or if the  condition fails to improve as anticipated.   I provided 10 minutes total of non-face-to-face time during this encounter.  Rema Fendt, NP  [facility] at [facility] [place], [place] [phone] 09/05/2020, 11:02 AM

## 2020-09-05 ENCOUNTER — Ambulatory Visit (INDEPENDENT_AMBULATORY_CARE_PROVIDER_SITE_OTHER): Payer: No Typology Code available for payment source | Admitting: Physician Assistant

## 2020-09-05 ENCOUNTER — Other Ambulatory Visit: Payer: Self-pay

## 2020-09-05 ENCOUNTER — Telehealth (INDEPENDENT_AMBULATORY_CARE_PROVIDER_SITE_OTHER): Payer: No Typology Code available for payment source | Admitting: Family

## 2020-09-05 ENCOUNTER — Encounter: Payer: Self-pay | Admitting: Family

## 2020-09-05 DIAGNOSIS — Z7689 Persons encountering health services in other specified circumstances: Secondary | ICD-10-CM

## 2020-09-05 NOTE — Progress Notes (Signed)
Establish care Physical not due till December  Upcoming TIF surgery

## 2020-09-06 ENCOUNTER — Other Ambulatory Visit (HOSPITAL_COMMUNITY): Payer: Self-pay

## 2020-09-06 ENCOUNTER — Ambulatory Visit (INDEPENDENT_AMBULATORY_CARE_PROVIDER_SITE_OTHER): Payer: No Typology Code available for payment source | Admitting: Physician Assistant

## 2020-09-06 ENCOUNTER — Encounter (INDEPENDENT_AMBULATORY_CARE_PROVIDER_SITE_OTHER): Payer: Self-pay | Admitting: Physician Assistant

## 2020-09-06 ENCOUNTER — Other Ambulatory Visit: Payer: Self-pay

## 2020-09-06 VITALS — BP 129/78 | HR 100 | Temp 98.5°F | Ht 62.0 in | Wt 193.0 lb

## 2020-09-06 DIAGNOSIS — Z9189 Other specified personal risk factors, not elsewhere classified: Secondary | ICD-10-CM

## 2020-09-06 DIAGNOSIS — E559 Vitamin D deficiency, unspecified: Secondary | ICD-10-CM

## 2020-09-06 DIAGNOSIS — Z6835 Body mass index (BMI) 35.0-35.9, adult: Secondary | ICD-10-CM | POA: Diagnosis not present

## 2020-09-06 MED ORDER — VITAMIN D (ERGOCALCIFEROL) 1.25 MG (50000 UNIT) PO CAPS
50000.0000 [IU] | ORAL_CAPSULE | ORAL | 0 refills | Status: DC
  Filled 2020-09-06: qty 4, 28d supply, fill #0

## 2020-09-11 NOTE — Progress Notes (Signed)
Chief Complaint:   OBESITY Candace [name] is here to discuss her progress with her obesity treatment plan along with follow-up of her obesity related diagnoses. Candace [name] is on the Category 2 Plan and states she is following her eating plan approximately 90% of the time. Candace [name] states she is walking 1/2 mile 2-3 times per week.   Today's visit was #: 12 Starting weight: 201 lbs Starting date: 02/23/2020 Today's weight: 193 lbs Today's date: 09/06/2020 Total lbs lost to date: 8 Total lbs lost since last in-office visit: 0  Interim History: Candace [name] hasn't been exercising as much due to her heart rate increasing. She may be overeating her snack calories on some days, and she is not eating all of her protein.  Subjective:   1. Vitamin D deficiency Candace [name] is on Vit D, and she denies nausea, vomiting, or muscle weakness.  Assessment/Plan:   1. Vitamin D deficiency Low Vitamin D level contributes to fatigue and are associated with obesity, breast, and colon cancer. We will refill prescription Vitamin D for 1 month. Candace [name] will follow-up for routine testing of Vitamin D, at least 2-3 times per year to avoid over-replacement.  - Vitamin D, Ergocalciferol, (DRISDOL) 1.25 MG (50000 UNIT) CAPS capsule; Take 1 capsule (50,000 Units total) by mouth once a week.  Dispense: 4 capsule; Refill: 0  2. Class 2 severe obesity due to excess calories with serious comorbidity and body mass index (BMI) of 35.0 to 35.9 in adult Candace [name]) Candace [name] is currently in the action stage of change. As such, her goal is to continue with weight loss efforts. She has agreed to the Category 2 Plan.   Exercise goals: As is.  Behavioral modification strategies: meal planning and cooking strategies and keeping healthy foods in the home.  Candace [name] has agreed to follow-up with our clinic in 3 weeks. She was informed of the importance of frequent follow-up visits to maximize her success with intensive lifestyle  modifications for her multiple health conditions.   Objective:   Blood pressure 129/78, pulse 100, temperature 98.5 F (36.9 C), height 5\' 2"  (1.575 m). Body mass index is 34.2 kg/m.  General: Cooperative, alert, well developed, in no acute distress. HEENT: Conjunctivae and lids unremarkable. Cardiovascular: Regular rhythm.  Lungs: Normal work of breathing. Neurologic: No focal deficits.   Lab Results  Component Value Date   CREATININE 1.06 (H) 08/06/2020   BUN 13 08/06/2020   NA 136 08/06/2020   K 3.5 08/06/2020   CL 106 08/06/2020   CO2 24 08/06/2020   Lab Results  Component Value Date   ALT 15 06/13/2020   AST 15 06/13/2020   ALKPHOS 65 06/13/2020   BILITOT 1.0 06/13/2020   Lab Results  Component Value Date   HGBA1C 5.6 06/13/2020   HGBA1C 5.7 (H) 02/23/2020   HGBA1C 5.6 04/05/2012   Lab Results  Component Value Date   INSULIN 5.1 06/13/2020   INSULIN 10.8 02/23/2020   Lab Results  Component Value Date   TSH 0.960 08/10/2020   Lab Results  Component Value Date   CHOL 247 (H) 06/13/2020   HDL 60 06/13/2020   LDLCALC 180 (H) 06/13/2020   LDLDIRECT 112 (H) 09/10/2011   TRIG 45 06/13/2020   CHOLHDL 3.0 10/22/2018   Lab Results  Component Value Date   WBC 7.1 08/06/2020   HGB 12.1 08/06/2020   HCT 37.4 08/06/2020   MCV 89.0 08/06/2020   PLT 255 08/06/2020   Lab Results  Component Value Date  IRON 44 09/10/2011   TIBC 391 09/10/2011    Obesity Behavioral Intervention:   Approximately 15 minutes were spent on the discussion below.  ASK: We discussed the diagnosis of obesity with Candace [name] today and Candace [name] agreed to give [place] permission to discuss obesity behavioral modification therapy today.  ASSESS: Candace [name] has the diagnosis of obesity and her BMI today is 35.29. Candace [name] is in the action stage of change.   ADVISE: Candace [name] was educated on the multiple health risks of obesity as well as the benefit of weight loss to improve her health.  She was advised of the need for long term treatment and the importance of lifestyle modifications to improve her current health and to decrease her risk of future health problems.  AGREE: Multiple dietary modification options and treatment options were discussed and Candace [name] agreed to follow the recommendations documented in the above note.  ARRANGE: Candace [name] was educated on the importance of frequent visits to treat obesity as outlined per CMS and USPSTF guidelines and agreed to schedule her next follow up appointment today.  Attestation Statements:   Reviewed by clinician on day of visit: allergies, medications, problem list, medical history, surgical history, family history, social history, and previous encounter notes.   Candace [name], am acting as transcriptionist for [employer], PA-C.  I have reviewed the above documentation for accuracy and completeness, and I agree with the above. Candace Cliche, PA-C

## 2020-09-13 ENCOUNTER — Other Ambulatory Visit (HOSPITAL_COMMUNITY): Payer: No Typology Code available for payment source

## 2020-09-13 ENCOUNTER — Telehealth (HOSPITAL_COMMUNITY): Payer: Self-pay | Admitting: *Deleted

## 2020-09-13 NOTE — Telephone Encounter (Signed)
Attempted to call patient regarding upcoming cardiac MRI appointment. Left message on voicemail with name and callback number  Rylin Seavey RN Navigator Cardiac Imaging [facility] [phone] Office [phone] Cell  

## 2020-09-14 ENCOUNTER — Ambulatory Visit (HOSPITAL_COMMUNITY)
Admission: RE | Admit: 2020-09-14 | Discharge: 2020-09-14 | Disposition: A | Discharge status: Home / Self Care | Payer: No Typology Code available for payment source | Source: Ambulatory Visit | Attending: Internal Medicine | Admitting: Internal Medicine

## 2020-09-14 ENCOUNTER — Other Ambulatory Visit: Payer: Self-pay

## 2020-09-14 DIAGNOSIS — R0600 Dyspnea, unspecified: Secondary | ICD-10-CM | POA: Insufficient documentation

## 2020-09-14 DIAGNOSIS — I447 Left bundle-branch block, unspecified: Secondary | ICD-10-CM

## 2020-09-14 MED ORDER — GADOBUTROL 1 MMOL/ML IV SOLN
10.0000 mL | Freq: Once | INTRAVENOUS | Status: AC | PRN
  Administered 2020-09-14: 10 mL via INTRAVENOUS

## 2020-09-20 ENCOUNTER — Other Ambulatory Visit: Payer: Self-pay

## 2020-09-20 ENCOUNTER — Ambulatory Visit (INDEPENDENT_AMBULATORY_CARE_PROVIDER_SITE_OTHER): Payer: No Typology Code available for payment source | Admitting: Internal Medicine

## 2020-09-20 ENCOUNTER — Encounter: Payer: Self-pay | Admitting: Internal Medicine

## 2020-09-20 ENCOUNTER — Other Ambulatory Visit (HOSPITAL_COMMUNITY): Payer: Self-pay

## 2020-09-20 VITALS — BP 135/74 | HR 82 | Ht 62.0 in | Wt 198.2 lb

## 2020-09-20 DIAGNOSIS — E785 Hyperlipidemia, unspecified: Secondary | ICD-10-CM

## 2020-09-20 DIAGNOSIS — R002 Palpitations: Secondary | ICD-10-CM

## 2020-09-20 DIAGNOSIS — I447 Left bundle-branch block, unspecified: Secondary | ICD-10-CM

## 2020-09-20 DIAGNOSIS — R000 Tachycardia, unspecified: Secondary | ICD-10-CM

## 2020-09-20 LAB — LIPID PANEL
Chol/HDL Ratio: 2.3 ratio (ref 0.0–4.4)
Cholesterol, Total: 154 mg/dL (ref 100–199)
HDL: 68 mg/dL (ref >39)
LDL Chol Calc (NIH): 78 mg/dL (ref 0–99)
Triglycerides: 32 mg/dL (ref 0–149)
VLDL Cholesterol Cal: 8 mg/dL (ref 5–40)

## 2020-09-20 MED ORDER — METOPROLOL SUCCINATE ER 25 MG PO TB24
12.5000 mg | ORAL_TABLET | Freq: Every day | ORAL | 3 refills | Status: DC
  Filled 2020-09-20: qty 45, 90d supply, fill #0

## 2020-09-20 NOTE — Progress Notes (Signed)
OFFICE NOTE  Chief Complaint:  Follow-up cMRI  Primary Care Physician: Rema Fendt, NP  HPI:  Candace [name] is a 35 y.o. female with a past medial history significant for recent chest pain, borderline diabetes, GERD, and irregular heartbeat.  She was recently seen in the emergency department for chest pain.  She had been having nausea and vomiting and been undergoing a GI work-up.  She had troponins that were negative.  EKG showed an IVCD/left bundle branch block which seems to be unchanged compared to an EKG in 2014.  BNP was low and I did not recommend any further inpatient testing.  She did have an outpatient echo however which showed a reduced LVEF to 50 to 55%.  Asymmetric left ventricular hypertrophy of the inferolateral segment.  Additionally there was reported to be basal ventricular septal thinning and hypokinesis.  A cardiac MRI was recommended to evaluate for a ? Granulomatous process.  She was seen in the interim by Edd Fabian, NP who had noted that he felt she had atypical chest pain which seem to have resolved.  He had started her on metoprolol tartrate due to tachycardia at 12.5 mg twice daily.  Since then she says she has not felt well.  At times she gets low blood pressure with her elevated heart rate and is concerned about the medication.  She has seen Dr. Myrtie Neither with GI for her vomiting/GERD symptoms.  07/19/2020  Candace [name] is seen today in follow-up.  She continues to have issues with chest discomfort.  She is scheduled to have esophageal manometry in March.  She is also been short of breath.  EKG shows sinus rhythm with a stable left bundle branch block at 69.  She had lipids in February showing a very high cholesterol.  Total is 247, HDL 60, LDL 180 and triglycerides 45.  She did undergo Myoview stress testing in December which was negative for ischemia and showed normal LV function.  09/20/2020  Candace [name] returns today for follow-up.  She reports continued episodes  of tachycardia which is paroxysmal.  This is required increasing doses of metoprolol but has been complicated by some hypotension.  She has been referred to see Dr. Lewayne Bunting in cardiac electrophysiology for further evaluation of possible bypass tract arrhythmia.  Regarding further work-up of her left bundle branch block she underwent cardiac MRI.  This did confirm a low normal LVEF of 51% with no evidence of infiltrative disease.  She was noted to have basal septal thinning in the area of the left bundle abnormal septal motion due to conduction delay.  There is a mid myocardial stripe in the basal septum but was felt to be nonspecific.  Makes me wonder whether or not she might of had some infarction in this area.  PMHx:  Past Medical History:  Diagnosis Date  . Allergy   . Anemia   . Anxiety   . BV (bacterial vaginosis) 12/15/2012  . Chest pain   . Diabetes (HCC)    borderline DM per patient - denies DM  . Eczema   . GERD (gastroesophageal reflux disease)   . Headache(784.0)   . History of chlamydia   . History of gonorrhea   . Hyperlipidemia    Phreesia 09/03/2020  . Irregular heartbeat   . Left bundle branch block   . Shortness of breath   . Tachycardia   . Vaginal itching 12/15/2012  . Vaginal Pap smear, abnormal    f/u ok  Past Surgical History:  Procedure Laterality Date  . 24 HOUR PH STUDY N/A 07/25/2020   Procedure: 24 HOUR PH STUDY;  Surgeon: Shellia Cleverly, DO;  Location: [facility];  Service: Gastroenterology;  Laterality: N/A;  . CERVICAL CONIZATION W/BX N/A 12/22/2018   Procedure: LASER ABLATION OF CERVIX;  Surgeon: Lazaro Arms, MD;  Location: [facility];  Service: Gynecology;  Laterality: N/A;  . DILATION AND CURETTAGE OF UTERUS N/A 11/22/2012   Procedure: SUCTION DILATATION AND CURETTAGE;  Surgeon: Lazaro Arms, MD;  Location: [facility];  Service: Gynecology;  Laterality: N/A;  . ESOPHAGEAL MANOMETRY N/A 07/25/2020   Procedure: ESOPHAGEAL MANOMETRY (EM);  Surgeon:  Shellia Cleverly, DO;  Location: [facility];  Service: Gastroenterology;  Laterality: N/A;    FAMHx:  Family History  Problem Relation Age of Onset  . Prostate cancer Paternal Grandfather   . Breast cancer Paternal Grandmother   . Lung cancer Paternal Grandmother   . Lung cancer Maternal Grandfather   . Colon polyps Father   . Prostate cancer Father   . Healthy Brother   . Healthy Daughter   . Healthy Son   . Breast cancer Maternal Aunt        Multiple with Breast Cancer  . Cervical cancer Maternal Aunt   . Stroke Maternal Aunt   . Hypertension Other   . Diabetes Other   . Other Other        stomach tumors  . Heart disease Other        her mom, gma and aunts have arrythmia  . Kidney disease Paternal Aunt   . Colon cancer Paternal Aunt        colon problems  . Stroke Paternal Uncle        PGreatUncle  . Prostate cancer Paternal Uncle   . Breast cancer Cousin   . Esophageal cancer Neg Hx   . Rectal cancer Neg Hx   . Stomach cancer Neg Hx     SOCHx:   reports that she has never smoked. She has never used smokeless tobacco. She reports previous alcohol use. She reports that she does not use drugs.  ALLERGIES:  Allergies  Allergen Reactions  . Vicodin [Hydrocodone-Acetaminophen] Other (See Comments)    violent  . Percocet [Oxycodone-Acetaminophen]     Violent behaviors  . Latex Itching and Rash  . Other     ROS: Pertinent items noted in HPI and remainder of comprehensive ROS otherwise negative.  HOME MEDS: Current Outpatient Medications on File Prior to Visit  Medication Sig Dispense Refill  . atorvastatin (LIPITOR) 40 MG tablet TAKE 1 TABLET (40 MG TOTAL) BY MOUTH DAILY. 90 tablet 3  . levonorgestrel (MIRENA) 20 MCG/24HR IUD 1 each by Intrauterine route once.    . metoprolol tartrate (LOPRESSOR) 25 MG tablet TAKE 1/2 TABLETS BY MOUTH AS NEEDED FOR SUSTAINED PALPITATIONS. 30 tablet 1  . Vitamin D, Ergocalciferol, (DRISDOL) 1.25 MG (50000 UNIT) CAPS capsule  Take 1 capsule (50,000 Units total) by mouth once a week. 4 capsule 0   No current facility-administered medications on file prior to visit.    LABS/IMAGING: No results found for this or any previous visit (from the past 48 hour(s)). No results found.  LIPID PANEL:    Component Value Date/Time   CHOL 247 (H) 06/13/2020 1151   TRIG 45 06/13/2020 1151   HDL 60 06/13/2020 1151   CHOLHDL 3.0 10/22/2018 1140   LDLCALC 180 (H) 06/13/2020 1151   LDLCALC 121 (H) 10/22/2018  1140   LDLDIRECT 112 (H) 09/10/2011 0952     WEIGHTS: Wt Readings from Last 3 Encounters:  09/20/20 198 lb 3.2 oz (89.9 kg)  09/06/20 193 lb (87.5 kg)  08/20/20 187 lb (84.8 kg)    VITALS: BP 135/74   Pulse 82   Ht 5\' 2"  (1.575 m)   Wt 198 lb 3.2 oz (89.9 kg)   SpO2 98%   BMI 36.25 kg/m   EXAM: General appearance: alert and no distress Neck: no carotid bruit, no JVD and thyroid not enlarged, symmetric, no tenderness/mass/nodules Lungs: clear to auscultation bilaterally Heart: regular rate and rhythm, S1, S2 normal, no murmur, click, rub or gallop Abdomen: soft, non-tender; bowel sounds normal; no masses,  no organomegaly Extremities: extremities normal, atraumatic, no cyanosis or edema Pulses: 2+ and symmetric Skin: Skin color, texture, turgor normal. No rashes or lesions Neurologic: Grossly normal Psych: Pleasant  EKG: Sinus rhythm with short PR, possible left atrial argument, LBBB at 82-personally reviewed  ASSESSMENT: 1. Chest pain/dyspnea 2. Low normal LVEF 50 to 55% (10/2019 echo) 3. Low risk myoview, LVEF 61% (04/2020) 4. LBBB 5. Recent vomiting/GERD 6. Sinus tachycardia 7. Marked dyslipidemia 8. LVEF 51%-mild proximal septal thickening with mid myocardial stripe, no evidence for scar, fibrosis, infarction or infiltrative process by CT MRI (09/2020)  PLAN: 1.   Ms. [name] had somewhat of a reassuring cardiac MRI although it did show some abnormality in the basal septum in the area of the  left bundle.  This likely explains her bundle branch block however the etiology of the changes are unclear.  She is not having some continued palpitations.  I have advised starting a low-dose of Toprol-XL 12.5 mg at night as she has had issues with low blood pressure.  Hopefully this will give her better coverage throughout the day as she is required a lot of short acting metoprolol for breakthrough palpitations.  She does have an upcoming EP appointment.  Plan follow-up with me in about 6 months or sooner as necessary.  Izola Price, MD, [facility], FACP  [place]  [facility]  Medical Director of the Advanced Lipid Disorders &  Cardiovascular Risk Reduction Clinic Diplomate of the American Board of Clinical Lipidology Attending Cardiologist  Direct Dial: [phone]  Fax: [phone]  Website:  [email or web address] Tayler Heiden 09/20/2020, 10:05 AM

## 2020-09-20 NOTE — Patient Instructions (Signed)
Medication Instructions:  START metoprolol succinate 12.5mg  daily at night OK to use metoprolol tartrate as needed for palpitations  *If you need a refill on your cardiac medications before your next appointment, please call your pharmacy*   Lab Work: LIPID PANEL today   If you have labs (blood work) drawn today and your tests are completely normal, you will receive your results only by: Marland Kitchen MyChart Message (if you have MyChart) OR . A paper copy in the mail If you have any lab test that is abnormal or we need to change your treatment, we will call you to review the results.   Testing/Procedures: NONE   Follow-Up: At [facility], you and your health needs are our priority.  As part of our continuing mission to provide you with exceptional heart care, we have created designated Provider Care Teams.  These Care Teams include your primary Cardiologist (physician) and Advanced Practice Providers (APPs -  Physician Assistants and Nurse Practitioners) who all work together to provide you with the care you need, when you need it.  We recommend signing up for the patient portal called "MyChart".  Sign up information is provided on this After Visit Summary.  MyChart is used to connect with patients for Virtual Visits (Telemedicine).  Patients are able to view lab/test results, encounter notes, upcoming appointments, etc.  Non-urgent messages can be sent to your provider as well.   To learn more about what you can do with MyChart, go to [email or web address].    Your next appointment:   6 month(s)  The format for your next appointment:   In Person  Provider:   You may see Chrystie Nose, MD or one of the following Advanced Practice Providers on your designated Care Team:    Azalee Course, PA-C  Micah Flesher, PA-C or   Judy Pimple, [place]    Other Instructions

## 2020-09-25 ENCOUNTER — Ambulatory Visit (INDEPENDENT_AMBULATORY_CARE_PROVIDER_SITE_OTHER): Payer: No Typology Code available for payment source | Admitting: Bariatrics

## 2020-09-25 ENCOUNTER — Other Ambulatory Visit: Payer: Self-pay

## 2020-09-25 ENCOUNTER — Encounter (INDEPENDENT_AMBULATORY_CARE_PROVIDER_SITE_OTHER): Payer: Self-pay | Admitting: Bariatrics

## 2020-09-25 ENCOUNTER — Other Ambulatory Visit (HOSPITAL_COMMUNITY): Payer: Self-pay

## 2020-09-25 VITALS — BP 112/78 | HR 95 | Temp 98.1°F | Ht 62.0 in | Wt 192.0 lb

## 2020-09-25 DIAGNOSIS — E7849 Other hyperlipidemia: Secondary | ICD-10-CM

## 2020-09-25 DIAGNOSIS — E559 Vitamin D deficiency, unspecified: Secondary | ICD-10-CM | POA: Diagnosis not present

## 2020-09-25 DIAGNOSIS — Z6836 Body mass index (BMI) 36.0-36.9, adult: Secondary | ICD-10-CM

## 2020-09-25 DIAGNOSIS — Z9189 Other specified personal risk factors, not elsewhere classified: Secondary | ICD-10-CM | POA: Diagnosis not present

## 2020-09-25 MED ORDER — VITAMIN D (ERGOCALCIFEROL) 1.25 MG (50000 UNIT) PO CAPS
50000.0000 [IU] | ORAL_CAPSULE | ORAL | 0 refills | Status: DC
  Filled 2020-09-25: qty 4, 28d supply, fill #0

## 2020-09-27 ENCOUNTER — Encounter (INDEPENDENT_AMBULATORY_CARE_PROVIDER_SITE_OTHER): Payer: Self-pay | Admitting: Bariatrics

## 2020-09-27 NOTE — Progress Notes (Signed)
Chief Complaint:   OBESITY Candace [name] is here to discuss her progress with her obesity treatment plan along with follow-up of her obesity related diagnoses. Candace [name] is on the Category 2 Plan and states she is following her eating plan approximately 90% of the time. Candace [name] states she is not currently exercising.  Today's visit was #: 13 Starting weight: 201 lbs Starting date: 02/23/2020 Today's weight: 192 lbs Today's date: 09/26/2020 Total lbs lost to date: 9 Total lbs lost since last in-office visit: 1  Interim History: Candace [name] is down 1 lb. She is drinking more water and getting protein.  Subjective:   1. Other hyperlipidemia Candace [name] is taking Lipitor.  2. Vitamin D deficiency Pt denies nausea, vomiting, and muscle weakness.  3. At risk for osteoporosis Candace [name] is at higher risk of osteopenia and osteoporosis due to Vitamin D deficiency.   Assessment/Plan:   1. Other hyperlipidemia Cardiovascular risk and specific lipid/LDL goals reviewed.  We discussed several lifestyle modifications today and Candace [name] will continue to work on diet, exercise and weight loss efforts. Orders and follow up as documented in patient record. Continue current treatment plan.  Counseling Intensive lifestyle modifications are the first line treatment for this issue. . Dietary changes: Increase soluble fiber. Decrease simple carbohydrates. . Exercise changes: Moderate to vigorous-intensity aerobic activity 150 minutes per week if tolerated. . Lipid-lowering medications: see documented in medical record.  2. Vitamin D deficiency Low Vitamin D level contributes to fatigue and are associated with obesity, breast, and colon cancer. She agrees to continue to take prescription Vitamin D @50 ,000 IU every week and will follow-up for routine testing of Vitamin D, at least 2-3 times per year to avoid over-replacement. - Vitamin D, Ergocalciferol, (DRISDOL) 1.25 MG (50000 UNIT) CAPS capsule; Take 1  capsule (50,000 Units total) by mouth once a week.  Dispense: 4 capsule; Refill: 0  3. At risk for osteoporosis Candace [name] was given approximately 15 minutes of osteoporosis prevention counseling today. Candace [name] is at risk for osteopenia and osteoporosis due to her Vitamin D deficiency. She was encouraged to take her Vitamin D and follow her higher calcium diet and increase strengthening exercise to help strengthen her bones and decrease her risk of osteopenia and osteoporosis.  Repetitive spaced learning was employed today to elicit superior memory formation and behavioral change.  4. Obesity with current BMI of 35  Candace [name] is currently in the action stage of change. As such, her goal is to continue with weight loss efforts. She has agreed to the Category 2 Plan.   Meal plan Intentional eating  Exercise goals: No exercise at this time  Behavioral modification strategies: increasing lean protein intake, decreasing simple carbohydrates, increasing vegetables, increasing water intake, decreasing eating out, no skipping meals, meal planning and cooking strategies, keeping healthy foods in the home and planning for success.  Candace [name] has agreed to follow-up with our clinic in 2 weeks. She was informed of the importance of frequent follow-up visits to maximize her success with intensive lifestyle modifications for her multiple health conditions.   Objective:   Blood pressure 112/78, pulse 95, temperature 98.1 F (36.7 C), height 5\' 2"  (1.575 m), weight 192 lb (87.1 kg), SpO2 97 %. Body mass index is 35.12 kg/m.  General: Cooperative, alert, well developed, in no acute distress. HEENT: Conjunctivae and lids unremarkable. Cardiovascular: Regular rhythm.  Lungs: Normal work of breathing. Neurologic: No focal deficits.   Lab Results  Component Value Date   CREATININE 1.06 (H) 08/06/2020   BUN  13 08/06/2020   NA 136 08/06/2020   K 3.5 08/06/2020   CL 106 08/06/2020   CO2 24 08/06/2020    Lab Results  Component Value Date   ALT 15 06/13/2020   AST 15 06/13/2020   ALKPHOS 65 06/13/2020   BILITOT 1.0 06/13/2020   Lab Results  Component Value Date   HGBA1C 5.6 06/13/2020   HGBA1C 5.7 (H) 02/23/2020   HGBA1C 5.6 04/05/2012   Lab Results  Component Value Date   INSULIN 5.1 06/13/2020   INSULIN 10.8 02/23/2020   Lab Results  Component Value Date   TSH 0.960 08/10/2020   Lab Results  Component Value Date   CHOL 154 09/20/2020   HDL 68 09/20/2020   LDLCALC 78 09/20/2020   LDLDIRECT 112 (H) 09/10/2011   TRIG 32 09/20/2020   CHOLHDL 2.3 09/20/2020   Lab Results  Component Value Date   WBC 7.1 08/06/2020   HGB 12.1 08/06/2020   HCT 37.4 08/06/2020   MCV 89.0 08/06/2020   PLT 255 08/06/2020   Lab Results  Component Value Date   IRON 44 09/10/2011   TIBC 391 09/10/2011     Attestation Statements:   Reviewed by clinician on day of visit: allergies, medications, problem list, medical history, surgical history, family history, social history, and previous encounter notes.  Edmund Hilda, CMA, am acting as Energy manager for [employer], DO.  I have reviewed the above documentation for accuracy and completeness, and I agree with the above. Corinna Capra, DO

## 2020-09-28 ENCOUNTER — Other Ambulatory Visit (HOSPITAL_COMMUNITY): Payer: Self-pay

## 2020-10-02 ENCOUNTER — Encounter: Payer: Self-pay | Admitting: Internal Medicine

## 2020-10-02 ENCOUNTER — Other Ambulatory Visit (HOSPITAL_COMMUNITY): Payer: Self-pay

## 2020-10-02 ENCOUNTER — Other Ambulatory Visit: Payer: Self-pay

## 2020-10-02 ENCOUNTER — Ambulatory Visit (INDEPENDENT_AMBULATORY_CARE_PROVIDER_SITE_OTHER): Payer: No Typology Code available for payment source | Admitting: Internal Medicine

## 2020-10-02 DIAGNOSIS — R002 Palpitations: Secondary | ICD-10-CM | POA: Insufficient documentation

## 2020-10-02 DIAGNOSIS — I456 Pre-excitation syndrome: Secondary | ICD-10-CM

## 2020-10-02 MED FILL — Atorvastatin Calcium Tab 40 MG (Base Equivalent): ORAL | 90 days supply | Qty: 90 | Fill #0 | Status: AC

## 2020-10-02 NOTE — Patient Instructions (Addendum)
Medication Instructions:  Your physician recommends that you continue on your current medications as directed. Please refer to the Current Medication list given to you today.  Labwork: None ordered.  Testing/Procedures: Your physician has recommended that you have an ablation. Catheter ablation is a medical procedure used to treat some cardiac arrhythmias (irregular heartbeats). During catheter ablation, a long, thin, flexible tube is put into a blood vessel in your groin (upper thigh), or neck. This tube is called an ablation catheter. It is then guided to your heart through the blood vessel. Radio frequency waves destroy small areas of heart tissue where abnormal heartbeats may cause an arrhythmia to start. Please see the instruction sheet given to you today.  Follow-Up:  Upcoming dates available for this procedure:  June 20 July 6, 8, 18, 21, 25  Any Other Special Instructions Will Be Listed Below (If Applicable).  If you need a refill on your cardiac medications before your next appointment, please call your pharmacy.     Cardiac electrophysiology: From cell to bedside (7th ed., pp. 1572-6203). [place], [place]: Elsevier.">  Cardiac Ablation Cardiac ablation is a procedure to destroy, or ablate, a small amount of heart tissue in very specific places. The heart has many electrical connections. Sometimes these connections are abnormal and can cause the heart to beat very fast or irregularly. Ablating some of the areas that cause problems can improve the heart's rhythm or return it to normal. Ablation may be done for people who:  Have Wolff-Parkinson-White syndrome.  Have fast heart rhythms (tachycardia).  Have taken medicines for an abnormal heart rhythm (arrhythmia) that were not effective or caused side effects.  Have a high-risk heartbeat that may be life-threatening. During the procedure, a small incision is made in the neck or the groin, and a long, thin tube (catheter) is  inserted into the incision and moved to the heart. Small devices (electrodes) on the tip of the catheter will send out electrical currents. A type of X-ray (fluoroscopy) will be used to help guide the catheter and to provide images of the heart. Tell a health care provider about:  Any allergies you have.  All medicines you are taking, including vitamins, herbs, eye drops, creams, and over-the-counter medicines.  Any problems you or family members have had with anesthetic medicines.  Any blood disorders you have.  Any surgeries you have had.  Any medical conditions you have, such as kidney failure.  Whether you are pregnant or may be pregnant. What are the risks? Generally, this is a safe procedure. However, problems may occur, including:  Infection.  Bruising and bleeding at the catheter insertion site.  Bleeding into the chest, especially into the sac that surrounds the heart. This is a serious complication.  Stroke or blood clots.  Damage to nearby structures or organs.  Allergic reaction to medicines or dyes.  Need for a permanent pacemaker if the normal electrical system is damaged. A pacemaker is a small computer that sends electrical signals to the heart and helps your heart beat normally.  The procedure not being fully effective. This may not be recognized until months later. Repeat ablation procedures are sometimes done. What happens before the procedure? Medicines Ask your health care provider about:  Changing or stopping your regular medicines. This is especially important if you are taking diabetes medicines or blood thinners.  Taking medicines such as aspirin and ibuprofen. These medicines can thin your blood. Do not take these medicines unless your health care provider tells you to  take them.  Taking over-the-counter medicines, vitamins, herbs, and supplements. General instructions  Follow instructions from your health care provider about eating or drinking  restrictions.  Plan to have someone take you home from the hospital or clinic.  If you will be going home right after the procedure, plan to have someone with you for 24 hours.  Ask your health care provider what steps will be taken to prevent infection. What happens during the procedure?  An IV will be inserted into one of your veins.  You will be given a medicine to help you relax (sedative).  The skin on your neck or groin will be numbed.  An incision will be made in your neck or your groin.  A needle will be inserted through the incision and into a large vein in your neck or groin.  A catheter will be inserted into the needle and moved to your heart.  Dye may be injected through the catheter to help your surgeon see the area of the heart that needs treatment.  Electrical currents will be sent from the catheter to ablate heart tissue in desired areas. There are three types of energy that may be used to do this: ? Heat (radiofrequency energy). ? Laser energy. ? Extreme cold (cryoablation).  When the tissue has been ablated, the catheter will be removed.  Pressure will be held on the insertion area to prevent a lot of bleeding.  A bandage (dressing) will be placed over the insertion area. The exact procedure may vary among health care providers and hospitals.   What happens after the procedure?  Your blood pressure, heart rate, breathing rate, and blood oxygen level will be monitored until you leave the hospital or clinic.  Your insertion area will be monitored for bleeding. You will need to lie still for a few hours to ensure that you do not bleed from the insertion area.  Do not drive for 24 hours or as long as told by your health care provider. Summary  Cardiac ablation is a procedure to destroy, or ablate, a small amount of heart tissue using an electrical current. This procedure can improve the heart rhythm or return it to normal.  Tell your health care provider  about any medical conditions you may have and all medicines you are taking to treat them.  This is a safe procedure, but problems may occur. Problems may include infection, bruising, damage to nearby organs or structures, or allergic reactions to medicines.  Follow your health care provider's instructions about eating and drinking before the procedure. You may also be told to change or stop some of your medicines.  After the procedure, do not drive for 24 hours or as long as told by your health care provider. This information is not intended to replace advice given to you by your health care provider. Make sure you discuss any questions you have with your health care provider. Document Revised: 03/07/2019 Document Reviewed: 03/07/2019 Elsevier Patient Education  2021 [employer].

## 2020-10-02 NOTE — Progress Notes (Signed)
HPI Candace [name] is referred today by Dr. Rennis Golden for evaluation of tachycardia. 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 He has had recurrent episodes of palpitations associated with chest pressure. She has worn a cardiac monitor which demonstrated ST vs AT with marked ventricular pre-excitation which did not resolve with tachycardia. She also has LV dysfunction by echo and MRI which I suspect is from effectively a delay in her LV activation due to the AP. She is maximally pre-excited.  Allergies  Allergen Reactions  . Vicodin [Hydrocodone-Acetaminophen] Other (See Comments)    violent  . Percocet [Oxycodone-Acetaminophen]     Violent behaviors  . Latex Itching and Rash  . Other      Current Outpatient Medications  Medication Sig Dispense Refill  . atorvastatin (LIPITOR) 40 MG tablet TAKE 1 TABLET (40 MG TOTAL) BY MOUTH DAILY. 90 tablet 3  . levonorgestrel (MIRENA) 20 MCG/24HR IUD 1 each by Intrauterine route once.    . metoprolol succinate (TOPROL-XL) 25 MG 24 hr tablet Take 0.5 tablets (12.5 mg total) by mouth at bedtime. 45 tablet 3  . metoprolol tartrate (LOPRESSOR) 25 MG tablet Take 12.5 mg by mouth as needed (elevated heart rate).    . Vitamin D, Ergocalciferol, (DRISDOL) 1.25 MG (50000 UNIT) CAPS capsule Take 1 capsule (50,000 Units total) by mouth once a week. 4 capsule 0   No current facility-administered medications for this visit.     Past Medical History:  Diagnosis Date  . Allergy   . Anemia   . Anxiety   . BV (bacterial vaginosis) 12/15/2012  . Chest pain   . Diabetes (HCC)    borderline DM per patient - denies DM  . Eczema   . GERD (gastroesophageal reflux disease)   . Headache(784.0)   . History of chlamydia   . History of gonorrhea   . Hyperlipidemia    Phreesia 09/03/2020  . Irregular heartbeat   . Left bundle branch block   . Shortness of breath   . Tachycardia   . Vaginal itching 12/15/2012  . Vaginal Pap smear, abnormal    f/u  ok    ROS:   All systems reviewed and negative except as noted in the HPI.   Past Surgical History:  Procedure Laterality Date  . 24 HOUR PH STUDY N/A 07/25/2020   Procedure: 24 HOUR PH STUDY;  Surgeon: Shellia Cleverly, DO;  Location: [facility];  Service: Gastroenterology;  Laterality: N/A;  . CERVICAL CONIZATION W/BX N/A 12/22/2018   Procedure: LASER ABLATION OF CERVIX;  Surgeon: Lazaro Arms, MD;  Location: [facility];  Service: Gynecology;  Laterality: N/A;  . DILATION AND CURETTAGE OF UTERUS N/A 11/22/2012   Procedure: SUCTION DILATATION AND CURETTAGE;  Surgeon: Lazaro Arms, MD;  Location: [facility];  Service: Gynecology;  Laterality: N/A;  . ESOPHAGEAL MANOMETRY N/A 07/25/2020   Procedure: ESOPHAGEAL MANOMETRY (EM);  Surgeon: Shellia Cleverly, DO;  Location: [facility];  Service: Gastroenterology;  Laterality: N/A;     Family History  Problem Relation Age of Onset  . Prostate cancer Paternal Grandfather   . Breast cancer Paternal Grandmother   . Lung cancer Paternal Grandmother   . Lung cancer Maternal Grandfather   . Colon polyps Father   . Prostate cancer Father   . Healthy Brother   . Healthy Daughter   . Healthy Son   . Breast cancer Maternal Aunt  Multiple with Breast Cancer  . Cervical cancer Maternal Aunt   . Stroke Maternal Aunt   . Hypertension Other   . Diabetes Other   . Other Other        stomach tumors  . Heart disease Other        her mom, gma and aunts have arrythmia  . Kidney disease Paternal Aunt   . Colon cancer Paternal Aunt        colon problems  . Stroke Paternal Uncle        PGreatUncle  . Prostate cancer Paternal Uncle   . Breast cancer Cousin   . Esophageal cancer Neg Hx   . Rectal cancer Neg Hx   . Stomach cancer Neg Hx      Social History   Socioeconomic History  . Marital status: Single    Spouse name: Not on file  . Number of children: 2  . Years of education: Not on file  . Highest education level: Some college,  no degree  Occupational History  . Occupation: CNA    Comment: [employer]  Tobacco Use  . Smoking status: Never Smoker  . Smokeless tobacco: Never Used  Vaping Use  . Vaping Use: Never used  Substance and Sexual Activity  . Alcohol use: Not Currently    Comment: socially   . Drug use: No  . Sexual activity: Yes    Birth control/protection: I.U.D.  Other Topics Concern  . Not on file  Social History Narrative  . Not on file   Social Determinants of Health   Financial Resource Strain: Not on file  Food Insecurity: Not on file  Transportation Needs: Not on file  Physical Activity: Not on file  Stress: Not on file  Social Connections: Not on file  Intimate Partner Violence: Not on file     BP 106/80   Pulse 76   Ht 5\' 2"  (1.575 m)   Wt 196 lb 12.8 oz (89.3 kg)   SpO2 99%   BMI 36.00 kg/m   Physical Exam:  Well appearing NAD HEENT: Unremarkable Neck:  No JVD, no thyromegally Lymphatics:  No adenopathy Back:  No CVA tenderness Lungs:  Clear HEART:  Regular rate rhythm, no murmurs, no rubs, no clicks Abd:  soft, positive bowel sounds, no organomegally, no rebound, no guarding Ext:  2 plus pulses, no edema, no cyanosis, no clubbing Skin:  No rashes no nodules Neuro:  CN II through XII intact, motor grossly intact  EKG - reviewed. 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 SHe is maximally pre-excited and is symptomatic. I have discussed the indications/risks/benefits/goals/expectations of EP study and ablation and she wishes to proceed. 2. Obesity - she will need to work on weight loss once her ablation is over.  Camry Robello,MD

## 2020-10-03 ENCOUNTER — Other Ambulatory Visit: Payer: No Typology Code available for payment source | Admitting: *Deleted

## 2020-10-03 DIAGNOSIS — I456 Pre-excitation syndrome: Secondary | ICD-10-CM

## 2020-10-03 LAB — CBC WITH DIFFERENTIAL/PLATELET
Basophils Absolute: 0 10*3/uL (ref 0.0–0.2)
Basos: 1 %
EOS (ABSOLUTE): 0.2 10*3/uL (ref 0.0–0.4)
Eos: 3 %
Hematocrit: 39.8 % (ref 34.0–46.6)
Hemoglobin: 12.5 g/dL (ref 11.1–15.9)
Immature Grans (Abs): 0 10*3/uL (ref 0.0–0.1)
Immature Granulocytes: 0 %
Lymphocytes Absolute: 1.8 10*3/uL (ref 0.7–3.1)
Lymphs: 30 %
MCH: 27.7 pg (ref 26.6–33.0)
MCHC: 31.4 g/dL — ABNORMAL LOW (ref 31.5–35.7)
MCV: 88 fL (ref 79–97)
Monocytes Absolute: 0.5 10*3/uL (ref 0.1–0.9)
Monocytes: 8 %
Neutrophils Absolute: 3.4 10*3/uL (ref 1.4–7.0)
Neutrophils: 58 %
Platelets: 263 10*3/uL (ref 150–450)
RBC: 4.52 x10E6/uL (ref 3.77–5.28)
RDW: 12.5 % (ref 11.7–15.4)
WBC: 5.8 10*3/uL (ref 3.4–10.8)

## 2020-10-03 LAB — BASIC METABOLIC PANEL
BUN/Creatinine Ratio: 16 (ref 9–23)
BUN: 14 mg/dL (ref 6–20)
CO2: 24 mmol/L (ref 20–29)
Calcium: 8.9 mg/dL (ref 8.7–10.2)
Chloride: 104 mmol/L (ref 96–106)
Creatinine, Ser: 0.89 mg/dL (ref 0.57–1.00)
Glucose: 80 mg/dL (ref 65–99)
Potassium: 4 mmol/L (ref 3.5–5.2)
Sodium: 139 mmol/L (ref 134–144)
eGFR: 87 mL/min/{1.73_m2} (ref >59)

## 2020-10-19 ENCOUNTER — Telehealth: Payer: Self-pay

## 2020-10-19 NOTE — Telephone Encounter (Signed)
Outreach made to Pt.  Advised could not complete ablation on June 20.  Advised could reschedule for June 27, same time.  Pt will determine if she can change work schedule and will advise via mychart.

## 2020-10-30 ENCOUNTER — Ambulatory Visit (INDEPENDENT_AMBULATORY_CARE_PROVIDER_SITE_OTHER): Payer: No Typology Code available for payment source | Admitting: Family Medicine

## 2020-10-30 ENCOUNTER — Other Ambulatory Visit: Payer: Self-pay

## 2020-10-30 ENCOUNTER — Encounter (INDEPENDENT_AMBULATORY_CARE_PROVIDER_SITE_OTHER): Payer: Self-pay | Admitting: Family Medicine

## 2020-10-30 ENCOUNTER — Other Ambulatory Visit (HOSPITAL_COMMUNITY): Payer: Self-pay

## 2020-10-30 VITALS — BP 107/71 | HR 74 | Temp 97.9°F | Ht 62.0 in | Wt 193.0 lb

## 2020-10-30 DIAGNOSIS — Z6836 Body mass index (BMI) 36.0-36.9, adult: Secondary | ICD-10-CM | POA: Diagnosis not present

## 2020-10-30 DIAGNOSIS — I456 Pre-excitation syndrome: Secondary | ICD-10-CM | POA: Diagnosis not present

## 2020-10-30 DIAGNOSIS — Z9189 Other specified personal risk factors, not elsewhere classified: Secondary | ICD-10-CM

## 2020-10-30 DIAGNOSIS — E559 Vitamin D deficiency, unspecified: Secondary | ICD-10-CM

## 2020-10-30 MED ORDER — VITAMIN D (ERGOCALCIFEROL) 1.25 MG (50000 UNIT) PO CAPS
50000.0000 [IU] | ORAL_CAPSULE | ORAL | 0 refills | Status: DC
  Filled 2020-10-30: qty 4, 28d supply, fill #0

## 2020-11-02 NOTE — Pre-Procedure Instructions (Signed)
Attempted to call patient regarding procedure instructions Instructed patient on the following items: Arrival time 0730 Nothing to eat or drink after midnight No meds AM of procedure Responsible person to drive you home and stay with you for 24 hrs

## 2020-11-05 ENCOUNTER — Ambulatory Visit (HOSPITAL_COMMUNITY)
Admission: RE | Admit: 2020-11-05 | Discharge: 2020-11-05 | Disposition: A | Discharge status: Home / Self Care | Payer: No Typology Code available for payment source | Attending: Internal Medicine | Admitting: Internal Medicine

## 2020-11-05 ENCOUNTER — Encounter (HOSPITAL_COMMUNITY)
Admission: RE | Disposition: A | Discharge status: Home / Self Care | Payer: Self-pay | Source: Home / Self Care | Attending: Internal Medicine

## 2020-11-05 ENCOUNTER — Other Ambulatory Visit: Payer: Self-pay

## 2020-11-05 DIAGNOSIS — Z91040 Latex allergy status: Secondary | ICD-10-CM | POA: Insufficient documentation

## 2020-11-05 DIAGNOSIS — Z79899 Other long term (current) drug therapy: Secondary | ICD-10-CM | POA: Diagnosis not present

## 2020-11-05 DIAGNOSIS — Z6836 Body mass index (BMI) 36.0-36.9, adult: Secondary | ICD-10-CM | POA: Diagnosis not present

## 2020-11-05 DIAGNOSIS — Z8619 Personal history of other infectious and parasitic diseases: Secondary | ICD-10-CM | POA: Insufficient documentation

## 2020-11-05 DIAGNOSIS — Z885 Allergy status to narcotic agent status: Secondary | ICD-10-CM | POA: Diagnosis not present

## 2020-11-05 DIAGNOSIS — E669 Obesity, unspecified: Secondary | ICD-10-CM | POA: Insufficient documentation

## 2020-11-05 DIAGNOSIS — I456 Pre-excitation syndrome: Secondary | ICD-10-CM | POA: Diagnosis present

## 2020-11-05 DIAGNOSIS — Z975 Presence of (intrauterine) contraceptive device: Secondary | ICD-10-CM | POA: Diagnosis not present

## 2020-11-05 HISTORY — PX: SVT ABLATION: EP1225

## 2020-11-05 LAB — BASIC METABOLIC PANEL
Anion gap: 7 (ref 5–15)
BUN: 14 mg/dL (ref 6–20)
CO2: 26 mmol/L (ref 22–32)
Calcium: 8.7 mg/dL — ABNORMAL LOW (ref 8.9–10.3)
Chloride: 105 mmol/L (ref 98–111)
Creatinine, Ser: 0.9 mg/dL (ref 0.44–1.00)
GFR, Estimated: >60 mL/min (ref >60)
Glucose, Bld: 93 mg/dL (ref 70–99)
Potassium: 4 mmol/L (ref 3.5–5.1)
Sodium: 138 mmol/L (ref 135–145)

## 2020-11-05 LAB — CBC
HCT: 40.3 % (ref 36.0–46.0)
Hemoglobin: 12.7 g/dL (ref 12.0–15.0)
MCH: 28.7 pg (ref 26.0–34.0)
MCHC: 31.5 g/dL (ref 30.0–36.0)
MCV: 91 fL (ref 80.0–100.0)
Platelets: 236 10*3/uL (ref 150–400)
RBC: 4.43 MIL/uL (ref 3.87–5.11)
RDW: 13.4 % (ref 11.5–15.5)
WBC: 5.5 10*3/uL (ref 4.0–10.5)
nRBC: 0 % (ref 0.0–0.2)

## 2020-11-05 LAB — GLUCOSE, CAPILLARY
Glucose-Capillary: 79 mg/dL (ref 70–99)
Glucose-Capillary: 82 mg/dL (ref 70–99)
Glucose-Capillary: 79 mg/dL (ref 70–99)

## 2020-11-05 LAB — POCT ACTIVATED CLOTTING TIME
Activated Clotting Time: 202 seconds
Activated Clotting Time: 173 seconds

## 2020-11-05 LAB — PREGNANCY, URINE: Preg Test, Ur: NEGATIVE

## 2020-11-05 SURGERY — SVT ABLATION
Anesthesia: LOCAL

## 2020-11-05 MED ORDER — ACETAMINOPHEN 325 MG PO TABS
650.0000 mg | ORAL_TABLET | Freq: Once | ORAL | Status: AC
  Administered 2020-11-05: 650 mg via ORAL
  Filled 2020-11-05 (×2): qty 2

## 2020-11-05 MED ORDER — BUPIVACAINE HCL (PF) 0.25 % IJ SOLN
INTRAMUSCULAR | Status: AC
  Filled 2020-11-05: qty 60

## 2020-11-05 MED ORDER — SODIUM CHLORIDE 0.9 % IV SOLN: INTRAVENOUS | Status: DC

## 2020-11-05 MED ORDER — FENTANYL CITRATE (PF) 100 MCG/2ML IJ SOLN
INTRAMUSCULAR | Status: AC
  Filled 2020-11-05: qty 2

## 2020-11-05 MED ORDER — FENTANYL CITRATE (PF) 100 MCG/2ML IJ SOLN
INTRAMUSCULAR | Status: DC | PRN
  Administered 2020-11-05 (×2): 25 ug via INTRAVENOUS
  Administered 2020-11-05 (×5): 12.5 ug via INTRAVENOUS
  Administered 2020-11-05: 25 ug via INTRAVENOUS

## 2020-11-05 MED ORDER — HEPARIN (PORCINE) IN NACL 1000-0.9 UT/500ML-% IV SOLN
INTRAVENOUS | Status: AC
  Filled 2020-11-05: qty 500

## 2020-11-05 MED ORDER — MIDAZOLAM HCL 5 MG/5ML IJ SOLN
INTRAMUSCULAR | Status: AC
  Filled 2020-11-05: qty 5

## 2020-11-05 MED ORDER — BUPIVACAINE HCL (PF) 0.25 % IJ SOLN
INTRAMUSCULAR | Status: DC | PRN
  Administered 2020-11-05: 45 mL

## 2020-11-05 MED ORDER — SODIUM CHLORIDE 0.9% FLUSH: 3.0000 mL | Freq: Two times a day (BID) | INTRAVENOUS | Status: DC

## 2020-11-05 MED ORDER — ONDANSETRON HCL 4 MG/2ML IJ SOLN: 4.0000 mg | Freq: Four times a day (QID) | INTRAMUSCULAR | Status: DC | PRN

## 2020-11-05 MED ORDER — MIDAZOLAM HCL 5 MG/5ML IJ SOLN
INTRAMUSCULAR | Status: DC | PRN
  Administered 2020-11-05: 1 mg via INTRAVENOUS
  Administered 2020-11-05: 2 mg via INTRAVENOUS
  Administered 2020-11-05: 1 mg via INTRAVENOUS
  Administered 2020-11-05: 2 mg via INTRAVENOUS
  Administered 2020-11-05: 1 mg via INTRAVENOUS
  Administered 2020-11-05: 2 mg via INTRAVENOUS
  Administered 2020-11-05 (×2): 1 mg via INTRAVENOUS

## 2020-11-05 MED ORDER — HEPARIN (PORCINE) IN NACL 1000-0.9 UT/500ML-% IV SOLN
INTRAVENOUS | Status: DC | PRN
  Administered 2020-11-05: 500 mL

## 2020-11-05 MED ORDER — SODIUM CHLORIDE 0.9% FLUSH: 3.0000 mL | INTRAVENOUS | Status: DC | PRN

## 2020-11-05 MED ORDER — HEPARIN SODIUM (PORCINE) 1000 UNIT/ML IJ SOLN
INTRAMUSCULAR | Status: DC | PRN
  Administered 2020-11-05: 5000 [IU] via INTRAVENOUS

## 2020-11-05 MED ORDER — SODIUM CHLORIDE 0.9 % IV SOLN: 250.0000 mL | INTRAVENOUS | Status: DC | PRN

## 2020-11-05 SURGICAL SUPPLY — 15 items
BAG SNAP BAND KOVER 36X36 (MISCELLANEOUS) ×1 IMPLANT
CATH DUODECA HALO/ISMUS 7FR (CATHETERS) ×1 IMPLANT
CATH JOSEPH QUAD ALLRED 6F REP (CATHETERS) ×1 IMPLANT
CATH JOSEPHSON 2-5-2 125 (CATHETERS) ×1 IMPLANT
CATH JSN HEX 2-5-2 120 (CATHETERS) ×1 IMPLANT
CATH SMTCH THERMOCOOL SF DF (CATHETERS) ×1 IMPLANT
MAT PREVALON FULL STRYKER (MISCELLANEOUS) ×1 IMPLANT
PACK EP LATEX FREE (CUSTOM PROCEDURE TRAY) ×2
PACK EP LF (CUSTOM PROCEDURE TRAY) ×1 IMPLANT
PAD PRO RADIOLUCENT 2001M-C (PAD) ×2 IMPLANT
PATCH CARTO3 (PAD) ×1 IMPLANT
SHEATH PINNACLE 6F 10CM (SHEATH) ×2 IMPLANT
SHEATH PINNACLE 7F 10CM (SHEATH) ×2 IMPLANT
SHEATH PINNACLE 8F 10CM (SHEATH) ×8 IMPLANT
TUBING SMART ABLATE COOLFLOW (TUBING) ×2 IMPLANT

## 2020-11-05 NOTE — Progress Notes (Signed)
SITE AREA: right groin/femoral  SITE PRIOR TO REMOVAL:  LEVEL 0  PRESSURE APPLIED FOR: approximately 20 minutes to arterial sheath site and approximately 10 minutes to veinous sheath sites x3, arterial sheath removed first  MANUAL: yes  PATIENT STATUS DURING PULL: c/o nausea, pt states she isn't supposed to take zofran, cool wash cloth and support given by Eunice Blase, RN  POST PULL SITE:  LEVEL 0   POST PULL INSTRUCTIONS GIVEN: yes  POST PULL PULSES PRESENT: bilateral pedal pulses at +2  DRESSING APPLIED: gauze with tegaderm  BEDREST BEGINS @ 1440  COMMENTS:

## 2020-11-05 NOTE — H&P (Signed)
    HPI Candace [name] is referred today by Dr. Hilty for evaluation of tachycardia. 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 He has had recurrent episodes of palpitations associated with chest pressure. She has worn a cardiac monitor which demonstrated ST vs AT with marked ventricular pre-excitation which did not resolve with tachycardia. She also has LV dysfunction by echo and MRI which I suspect is from effectively a delay in her LV activation due to the AP. She is maximally pre-excited.  Allergies  Allergen Reactions  . Vicodin [Hydrocodone-Acetaminophen] Other (See Comments)    violent  . Percocet [Oxycodone-Acetaminophen]     Violent behaviors  . Latex Itching and Rash  . Other      Current Outpatient Medications  Medication Sig Dispense Refill  . atorvastatin (LIPITOR) 40 MG tablet TAKE 1 TABLET (40 MG TOTAL) BY MOUTH DAILY. 90 tablet 3  . levonorgestrel (MIRENA) 20 MCG/24HR IUD 1 each by Intrauterine route once.    . metoprolol succinate (TOPROL-XL) 25 MG 24 hr tablet Take 0.5 tablets (12.5 mg total) by mouth at bedtime. 45 tablet 3  . metoprolol tartrate (LOPRESSOR) 25 MG tablet Take 12.5 mg by mouth as needed (elevated heart rate).    . Vitamin D, Ergocalciferol, (DRISDOL) 1.25 MG (50000 UNIT) CAPS capsule Take 1 capsule (50,000 Units total) by mouth once a week. 4 capsule 0   No current facility-administered medications for this visit.     Past Medical History:  Diagnosis Date  . Allergy   . Anemia   . Anxiety   . BV (bacterial vaginosis) 12/15/2012  . Chest pain   . Diabetes (HCC)    borderline DM per patient - denies DM  . Eczema   . GERD (gastroesophageal reflux disease)   . Headache(784.0)   . History of chlamydia   . History of gonorrhea   . Hyperlipidemia    Phreesia 09/03/2020  . Irregular heartbeat   . Left bundle branch block   . Shortness of breath   . Tachycardia   . Vaginal itching 12/15/2012  . Vaginal Pap smear, abnormal    f/u  ok    ROS:   All systems reviewed and negative except as noted in the HPI.   Past Surgical History:  Procedure Laterality Date  . 24 HOUR PH STUDY N/A 07/25/2020   Procedure: 24 HOUR PH STUDY;  Surgeon: Cirigliano, Vito V, DO;  Location: [facility];  Service: Gastroenterology;  Laterality: N/A;  . CERVICAL CONIZATION W/BX N/A 12/22/2018   Procedure: LASER ABLATION OF CERVIX;  Surgeon: Eure, Luther H, MD;  Location: [facility];  Service: Gynecology;  Laterality: N/A;  . DILATION AND CURETTAGE OF UTERUS N/A 11/22/2012   Procedure: SUCTION DILATATION AND CURETTAGE;  Surgeon: Luther H Eure, MD;  Location: [facility];  Service: Gynecology;  Laterality: N/A;  . ESOPHAGEAL MANOMETRY N/A 07/25/2020   Procedure: ESOPHAGEAL MANOMETRY (EM);  Surgeon: Cirigliano, Vito V, DO;  Location: [facility];  Service: Gastroenterology;  Laterality: N/A;     Family History  Problem Relation Age of Onset  . Prostate cancer Paternal Grandfather   . Breast cancer Paternal Grandmother   . Lung cancer Paternal Grandmother   . Lung cancer Maternal Grandfather   . Colon polyps Father   . Prostate cancer Father   . Healthy Brother   . Healthy Daughter   . Healthy Son   . Breast cancer Maternal Aunt          Multiple with Breast Cancer  . Cervical cancer Maternal Aunt   . Stroke Maternal Aunt   . Hypertension Other   . Diabetes Other   . Other Other        stomach tumors  . Heart disease Other        her mom, gma and aunts have arrythmia  . Kidney disease Paternal Aunt   . Colon cancer Paternal Aunt        colon problems  . Stroke Paternal Uncle        PGreatUncle  . Prostate cancer Paternal Uncle   . Breast cancer Cousin   . Esophageal cancer Neg Hx   . Rectal cancer Neg Hx   . Stomach cancer Neg Hx      Social History   Socioeconomic History  . Marital status: Single    Spouse name: Not on file  . Number of children: 2  . Years of education: Not on file  . Highest education level: Some college,  no degree  Occupational History  . Occupation: CNA    Comment: [employer]  Tobacco Use  . Smoking status: Never Smoker  . Smokeless tobacco: Never Used  Vaping Use  . Vaping Use: Never used  Substance and Sexual Activity  . Alcohol use: Not Currently    Comment: socially   . Drug use: No  . Sexual activity: Yes    Birth control/protection: I.U.D.  Other Topics Concern  . Not on file  Social History Narrative  . Not on file   Social Determinants of Health   Financial Resource Strain: Not on file  Food Insecurity: Not on file  Transportation Needs: Not on file  Physical Activity: Not on file  Stress: Not on file  Social Connections: Not on file  Intimate Partner Violence: Not on file     BP 106/80   Pulse 76   Ht 5' 2" (1.575 m)   Wt 196 lb 12.8 oz (89.3 kg)   SpO2 99%   BMI 36.00 kg/m   Physical Exam:  Well appearing NAD HEENT: Unremarkable Neck:  No JVD, no thyromegally Lymphatics:  No adenopathy Back:  No CVA tenderness Lungs:  Clear HEART:  Regular rate rhythm, no murmurs, no rubs, no clicks Abd:  soft, positive bowel sounds, no organomegally, no rebound, no guarding Ext:  2 plus pulses, no edema, no cyanosis, no clubbing Skin:  No rashes no nodules Neuro:  CN II through XII intact, motor grossly intact  EKG - reviewed. 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 SHe is maximally pre-excited and is symptomatic. I have discussed the indications/risks/benefits/goals/expectations of EP study and ablation and she wishes to proceed. 2. Obesity - she will need to work on weight loss once her ablation is over.  Candace Haile,MD   wishes to proceed. 2. Obesity - she will need to work on weight loss once her ablation is over.   Candace [name] Candace Jewel,MD

## 2020-11-05 NOTE — Progress Notes (Signed)
SITE AREA: right neck area  SITE PRIOR TO REMOVAL:  LEVEL 0  PRESSURE APPLIED FOR: approximately 5 minutes by Eunice Blase, RN  MANUAL: yes  PATIENT STATUS DURING PULL: stable  POST PULL SITE:  LEVEL 0  POST PULL INSTRUCTIONS GIVEN: yes   DRESSING APPLIED: vaseline gauze, gauze with tegaderm   COMMENTS:

## 2020-11-05 NOTE — Progress Notes (Signed)
Chief Complaint:   OBESITY Candace [name] is here to discuss her progress with her obesity treatment plan along with follow-up of her obesity related diagnoses. Candace [name] is on the Category 2 Plan and states she is following her eating plan approximately 90% of the time. Candace [name] states she is doing 0 minutes 0 times per week.  Today's visit was #: 14 Starting weight: 201 lbs Starting date: 02/23/2020 Today's weight: 193 lbs Today's date: 10/30/2020 Total lbs lost to date: 8 Total lbs lost since last in-office visit: 0  Interim History: Candace [name] is struggling to follow her plan due to nausea after she eats. She is seeing a GI doctor but she has to wait until her cardiac ablation before she deals with this. She is still tying  to follow her plan as closely as she can.  Subjective:   1. Vitamin D deficiency Candace [name] is on Vit D, but her level is not yet at [employer].  2. Wolff-Parkinson-White (WPW) pattern Candace [name] is followed by Cardiology, and she is having ablation next week. She was told if she gains >10 lbs after the ablation the WPW may return and so she is working on diet and weight loss.  3. At risk for dehydration Candace [name] is at risk for dehydration due to inadequate water intake.  Assessment/Plan:   1. Vitamin D deficiency Low Vitamin D level contributes to fatigue and are associated with obesity, breast, and colon cancer. We will refill prescription Vitamin D for 1 month. [name] will follow-up for routine testing of Vitamin D, at least 2-3 times per year to avoid over-replacement.  - Vitamin D, Ergocalciferol, (DRISDOL) 1.25 MG (50000 UNIT) CAPS capsule; Take 1 capsule (50,000 Units total) by mouth once a week.  Dispense: 4 capsule; Refill: 0  2. Wolff-Parkinson-White (WPW) pattern Candace [name] will continue to improve nurition to avoid weight gain and ideally lose weight.  3. At risk for dehydration Candace [name] was given approximately 15 minutes dehydration prevention counseling  today. Candace [name] is at risk for dehydration due to weight loss and current medication(s). She was encouraged to hydrate and monitor fluid status to avoid dehydration as well as weight loss plateaus.   4. Obesity with current BMI 35.3 Candace [name] is currently in the action stage of change. As such, her goal is to continue with weight loss efforts. She has agreed to the Category 2 Plan.   Behavioral modification strategies: increasing lean protein intake and increasing water intake.  Candace [name] has agreed to follow-up with our clinic in 4 to 5 weeks. She was informed of the importance of frequent follow-up visits to maximize her success with intensive lifestyle modifications for her multiple health conditions.   Objective:   Blood pressure 107/71, pulse 74, temperature 97.9 F (36.6 C), height 5\' 2"  (1.575 m), weight 193 lb (87.5 kg), SpO2 99 %. Body mass index is 35.3 kg/m.  General: Cooperative, alert, well developed, in no acute distress. HEENT: Conjunctivae and lids unremarkable. Cardiovascular: Regular rhythm.  Lungs: Normal work of breathing. Neurologic: No focal deficits.   Lab Results  Component Value Date   CREATININE 0.90 11/05/2020   BUN 14 11/05/2020   NA 138 11/05/2020   K 4.0 11/05/2020   CL 105 11/05/2020   CO2 26 11/05/2020   Lab Results  Component Value Date   ALT 15 06/13/2020   AST 15 06/13/2020   ALKPHOS 65 06/13/2020   BILITOT 1.0 06/13/2020   Lab Results  Component Value Date   HGBA1C 5.6 06/13/2020   HGBA1C  5.7 (H) 02/23/2020   HGBA1C 5.6 04/05/2012   Lab Results  Component Value Date   INSULIN 5.1 06/13/2020   INSULIN 10.8 02/23/2020   Lab Results  Component Value Date   TSH 0.960 08/10/2020   Lab Results  Component Value Date   CHOL 154 09/20/2020   HDL 68 09/20/2020   LDLCALC 78 09/20/2020   LDLDIRECT 112 (H) 09/10/2011   TRIG 32 09/20/2020   CHOLHDL 2.3 09/20/2020   Lab Results  Component Value Date   WBC 5.5 11/05/2020   HGB 12.7  11/05/2020   HCT 40.3 11/05/2020   MCV 91.0 11/05/2020   PLT 236 11/05/2020   Lab Results  Component Value Date   IRON 44 09/10/2011   TIBC 391 09/10/2011   Attestation Statements:   Reviewed by clinician on day of visit: allergies, medications, problem list, medical history, surgical history, family history, social history, and previous encounter notes.   I, Burt Knack, am acting as transcriptionist for Quillian Quince, MD.  I have reviewed the above documentation for accuracy and completeness, and I agree with the above. -  Quillian Quince, MD

## 2020-11-05 NOTE — Progress Notes (Signed)
Patient c/o R groin pain following procedure and requesting Tylenol. R groin dressing has old drainage of blood, but no hematoma assessed or palpated. MD gave verbal order for Tylenol 650 mg once. Orders placed. Ladona Ridgel, MD stated that he would be up to see the patient within the hour.

## 2020-11-05 NOTE — Discharge Instructions (Signed)

## 2020-11-06 ENCOUNTER — Telehealth: Payer: Self-pay | Admitting: Internal Medicine

## 2020-11-06 ENCOUNTER — Encounter (HOSPITAL_COMMUNITY): Payer: Self-pay | Admitting: Internal Medicine

## 2020-11-06 NOTE — Telephone Encounter (Signed)
I have the patient on the line who said that she went through a procedure yesterday and that her groin is starting to bleed. patient said she needs to speak with you if possible

## 2020-11-06 NOTE — Telephone Encounter (Signed)
Patient still has pressure dressing in place and is not due to be removed until ~8 pm. On the dressing the patient has a dime sized area of blood. Advised to have person with her to hold pressure for 30 minutes. Advised IF bleeding does not stop or it re bleeds to go the ER. IF dressing ever saturates with blood quickly hold pressure and call 911. Patient verbalized understanding. MD aware.

## 2020-11-08 ENCOUNTER — Other Ambulatory Visit: Payer: Self-pay

## 2020-11-08 ENCOUNTER — Other Ambulatory Visit (HOSPITAL_COMMUNITY): Payer: Self-pay

## 2020-11-08 MED ORDER — FLECAINIDE ACETATE 50 MG PO TABS
50.0000 mg | ORAL_TABLET | Freq: Two times a day (BID) | ORAL | 11 refills | Status: DC
  Filled 2020-11-08: qty 60, 30d supply, fill #0

## 2020-11-08 MED ORDER — METOPROLOL SUCCINATE ER 25 MG PO TB24
12.5000 mg | ORAL_TABLET | Freq: Two times a day (BID) | ORAL | 3 refills | Status: DC
  Filled 2020-11-08: qty 90, 90d supply, fill #0

## 2020-11-16 ENCOUNTER — Ambulatory Visit (INDEPENDENT_AMBULATORY_CARE_PROVIDER_SITE_OTHER): Payer: No Typology Code available for payment source

## 2020-11-16 ENCOUNTER — Other Ambulatory Visit: Payer: Self-pay

## 2020-11-16 DIAGNOSIS — R002 Palpitations: Secondary | ICD-10-CM

## 2020-11-16 NOTE — Progress Notes (Unsigned)
Enrolled patient for a 7 day Zio XT monitor to be mailed to patients home.  

## 2020-11-20 DIAGNOSIS — R002 Palpitations: Secondary | ICD-10-CM

## 2020-11-22 ENCOUNTER — Encounter (INDEPENDENT_AMBULATORY_CARE_PROVIDER_SITE_OTHER): Payer: Self-pay | Admitting: Family Medicine

## 2020-11-22 NOTE — Telephone Encounter (Signed)
Dr.Beasley 

## 2020-11-27 ENCOUNTER — Ambulatory Visit: Payer: No Typology Code available for payment source | Admitting: Internal Medicine

## 2020-11-28 ENCOUNTER — Other Ambulatory Visit (INDEPENDENT_AMBULATORY_CARE_PROVIDER_SITE_OTHER): Payer: Self-pay | Admitting: Family Medicine

## 2020-11-28 DIAGNOSIS — E559 Vitamin D deficiency, unspecified: Secondary | ICD-10-CM

## 2020-11-28 NOTE — Telephone Encounter (Signed)
Dr.Beasley 

## 2020-11-29 ENCOUNTER — Other Ambulatory Visit (INDEPENDENT_AMBULATORY_CARE_PROVIDER_SITE_OTHER): Payer: Self-pay | Admitting: Emergency Medicine

## 2020-11-29 ENCOUNTER — Other Ambulatory Visit (HOSPITAL_COMMUNITY): Payer: Self-pay

## 2020-11-29 DIAGNOSIS — E559 Vitamin D deficiency, unspecified: Secondary | ICD-10-CM

## 2020-11-29 MED ORDER — VITAMIN D (ERGOCALCIFEROL) 1.25 MG (50000 UNIT) PO CAPS
50000.0000 [IU] | ORAL_CAPSULE | ORAL | 0 refills | Status: DC
  Filled 2020-11-29: qty 4, 28d supply, fill #0

## 2020-11-29 NOTE — Telephone Encounter (Signed)
Rs sent.

## 2020-11-29 NOTE — Telephone Encounter (Signed)
Ok x 1

## 2020-11-30 ENCOUNTER — Ambulatory Visit: Payer: No Typology Code available for payment source | Admitting: Internal Medicine

## 2020-11-30 ENCOUNTER — Encounter: Payer: Self-pay | Admitting: Internal Medicine

## 2020-11-30 ENCOUNTER — Other Ambulatory Visit: Payer: Self-pay

## 2020-11-30 ENCOUNTER — Other Ambulatory Visit (HOSPITAL_COMMUNITY): Payer: Self-pay

## 2020-11-30 DIAGNOSIS — I456 Pre-excitation syndrome: Secondary | ICD-10-CM

## 2020-11-30 MED ORDER — FLECAINIDE ACETATE 100 MG PO TABS
100.0000 mg | ORAL_TABLET | Freq: Two times a day (BID) | ORAL | 3 refills | Status: DC
  Filled 2020-11-30: qty 180, 90d supply, fill #0
  Filled 2021-03-07: qty 180, 90d supply, fill #1
  Filled 2021-06-07: qty 180, 90d supply, fill #2
  Filled 2021-09-04: qty 180, 90d supply, fill #3

## 2020-11-30 NOTE — Patient Instructions (Addendum)
Medication Instructions:  Your physician recommends that you continue on your current medications as directed. Please refer to the Current Medication list given to you today.  Labwork: None ordered.  Testing/Procedures: None ordered.  Follow-Up: Nurse visit 8/4 at 11:00  Doctor follow up 9/16 at 8:30 am  Any Other Special Instructions Will Be Listed Below (If Applicable).  If you need a refill on your cardiac medications before your next appointment, please call your pharmacy.

## 2020-11-30 NOTE — Progress Notes (Signed)
HPI Ms. Kutner returns today for followup. She is a pleasant 35 yo woman with WPW syndrome who underwent attempted catheter ablation several weeks ago and was found to have an anteroseptal AP. During RF energy application, the patient's AP conduction would terminate and she would develop junctional rhythm with concern for CHB. The patient's AP was not life threatening with AP block at 300 ms when atrial pacing was carried out. The patient has continued to have palpitations and was started on flecainide 50 mg twice daily. She does not have CAD. She has worn a cardiac monitor demonstrating no SVT or atrial fib but ST at 120 though I cannot rule out atrial tachycardia.   Allergies  Allergen Reactions   Vicodin [Hydrocodone-Acetaminophen] Other (See Comments)    violent   Percocet [Oxycodone-Acetaminophen]     Violent behaviors   Latex Itching and Rash   Other     Grass      Current Outpatient Medications  Medication Sig Dispense Refill   atorvastatin (LIPITOR) 40 MG tablet TAKE 1 TABLET (40 MG TOTAL) BY MOUTH DAILY. 90 tablet 3   flecainide (TAMBOCOR) 100 MG tablet Take 1 tablet (100 mg total) by mouth 2 (two) times daily. 180 tablet 3   levonorgestrel (MIRENA) 20 MCG/24HR IUD 1 each by Intrauterine route once.     metoprolol succinate (TOPROL XL) 25 MG 24 hr tablet Take 0.5 tablets (12.5 mg total) by mouth in the morning and at bedtime. 90 tablet 3   Vitamin D, Ergocalciferol, (DRISDOL) 1.25 MG (50000 UNIT) CAPS capsule Take 1 capsule (50,000 Units total) by mouth once a week. 4 capsule 0   No current facility-administered medications for this visit.     Past Medical History:  Diagnosis Date   Allergy    Anemia    Anxiety    BV (bacterial vaginosis) 12/15/2012   Chest pain    Diabetes (HCC)    borderline DM per patient - denies DM   Eczema    GERD (gastroesophageal reflux disease)    Headache(784.0)    History of chlamydia    History of gonorrhea    Hyperlipidemia     [employer]reesia 09/03/2020   Irregular heartbeat    Left bundle branch block    Shortness of breath    Tachycardia    Vaginal itching 12/15/2012   Vaginal Pap smear, abnormal    f/u ok    ROS:   All systems reviewed and negative except as noted in the HPI.   Past Surgical History:  Procedure Laterality Date   28 HOUR PH STUDY N/A 07/25/2020   Procedure: 24 HOUR PH STUDY;  Surgeon: Shellia Cleverly, DO;  Location: [facility];  Service: Gastroenterology;  Laterality: N/A;   CERVICAL CONIZATION W/BX N/A 12/22/2018   Procedure: LASER ABLATION OF CERVIX;  Surgeon: Lazaro Arms, MD;  Location: [facility];  Service: Gynecology;  Laterality: N/A;   DILATION AND CURETTAGE OF UTERUS N/A 11/22/2012   Procedure: SUCTION DILATATION AND CURETTAGE;  Surgeon: Lazaro Arms, MD;  Location: [facility];  Service: Gynecology;  Laterality: N/A;   ESOPHAGEAL MANOMETRY N/A 07/25/2020   Procedure: ESOPHAGEAL MANOMETRY (EM);  Surgeon: Shellia Cleverly, DO;  Location: [facility];  Service: Gastroenterology;  Laterality: N/A;   SVT ABLATION N/A 11/05/2020   Procedure: SVT ABLATION;  Surgeon: Marinus Maw, MD;  Location: [facility] CV [facility];  Service: Cardiovascular;  Laterality: N/A;     Family History  Problem Relation Age  of Onset   Prostate cancer Paternal Grandfather    Breast cancer Paternal Grandmother    Lung cancer Paternal Grandmother    Lung cancer Maternal Grandfather    Colon polyps Father    Prostate cancer Father    Healthy Brother    Healthy Daughter    Healthy Son    Breast cancer Maternal Aunt        Multiple with Breast Cancer   Cervical cancer Maternal Aunt    Stroke Maternal Aunt    Hypertension Other    Diabetes Other    Other Other        stomach tumors   Heart disease Other        her mom, gma and aunts have arrythmia   Kidney disease Paternal Aunt    Colon cancer Paternal Aunt        colon problems   Stroke Paternal Uncle        PGreatUncle   Prostate cancer Paternal Uncle     Breast cancer Cousin    Esophageal cancer Neg Hx    Rectal cancer Neg Hx    Stomach cancer Neg Hx      Social History   Socioeconomic History   Marital status: Single    Spouse name: Not on file   Number of children: 2   Years of education: Not on file   Highest education level: Some college, no degree  Occupational History   Occupation: CNA    Comment: [employer]  Tobacco Use   Smoking status: Never   Smokeless tobacco: Never  Vaping Use   Vaping Use: Never used  Substance and Sexual Activity   Alcohol use: Not Currently    Comment: socially    Drug use: No   Sexual activity: Yes    Birth control/protection: I.U.D.  Other Topics Concern   Not on file  Social History Narrative   Not on file   Social Determinants of Health   Financial Resource Strain: Not on file  Food Insecurity: Not on file  Transportation Needs: Not on file  Physical Activity: Not on file  Stress: Not on file  Social Connections: Not on file  Intimate Partner Violence: Not on file     BP 114/72   Pulse 85   Ht 5' 2.5" (1.588 m)   Wt 196 lb 9.6 oz (89.2 kg)   SpO2 97%   BMI 35.39 kg/m   Physical Exam:  Well appearing NAD HEENT: Unremarkable Neck:  No JVD, no thyromegally Lymphatics:  No adenopathy Back:  No CVA tenderness Lungs:  Clear with no wheezes HEART:  Regular rate rhythm, no murmurs, no rubs, no clicks Abd:  soft, positive bowel sounds, no organomegally, no rebound, no guarding Ext:  2 plus pulses, no edema, no cyanosis, no clubbing Skin:  No rashes no nodules Neuro:  CN II through XII intact, motor grossly intact  EKG - NSR with WPW syndrome.  Assess/Plan:  WPW syndrome - The patient has persistent AP conduction. I have asked her to increase her Flecainide to 100 mg twice daily. She will return in 2 weeks for an ECG and a if she looks good, we will schedule an exercise test.  Obesity - she states that she has lost 40 lbs. I encouraged her to continue.     Sharlot Gowda Jakhai Fant,MD

## 2020-12-03 ENCOUNTER — Encounter (INDEPENDENT_AMBULATORY_CARE_PROVIDER_SITE_OTHER): Payer: Self-pay | Admitting: Family Medicine

## 2020-12-03 ENCOUNTER — Ambulatory Visit (INDEPENDENT_AMBULATORY_CARE_PROVIDER_SITE_OTHER): Payer: No Typology Code available for payment source | Admitting: Family Medicine

## 2020-12-03 ENCOUNTER — Other Ambulatory Visit: Payer: Self-pay

## 2020-12-03 VITALS — BP 115/80 | HR 74 | Temp 98.0°F | Ht 62.0 in | Wt 195.0 lb

## 2020-12-03 DIAGNOSIS — Z6836 Body mass index (BMI) 36.0-36.9, adult: Secondary | ICD-10-CM

## 2020-12-03 DIAGNOSIS — I959 Hypotension, unspecified: Secondary | ICD-10-CM | POA: Diagnosis not present

## 2020-12-03 DIAGNOSIS — E559 Vitamin D deficiency, unspecified: Secondary | ICD-10-CM

## 2020-12-07 NOTE — Progress Notes (Signed)
Chief Complaint:   OBESITY Candace [name] is here to discuss her progress with her obesity treatment plan along with follow-up of her obesity related diagnoses. Candace [name] is on the Category 2 Plan and states she is following her eating plan approximately 95% of the time. Candace [name] states she is doing 0 minutes 0 times per week.  Today's visit was #: 15 Starting weight: 201 lbs Starting date: 02/23/2020 Today's weight: 195 lbs Today's date: 12/03/2020 Total lbs lost to date: 6 Total lbs lost since last in-office visit: 0  Interim History: Candace [name] continues to work on following her meal plan, but her weight is not improving yet. She is concentrating on following up with Cardiology for palpitations.  Subjective:   1. Hypotension, unspecified hypotension type Candace [name] states her Cardiologist encouraged her to increase Na+ due to antiarrhythmia medications decreasing her blood pressure too low. She doesn't really like salt and she finds this challenging.  Assessment/Plan:   1. Hypotension, unspecified hypotension type We discussed ways to increase Na+ that doesn't taste too salty such as prepackaged foods and canned vegetables. Candace [name] is to increase her water intake and will continue to monitor.  2. Obesity with current BMI 35.7 Candace [name] is currently in the action stage of change. As such, her goal is to continue with weight loss efforts. She has agreed to change to the Category 2 Plan or keeping a food journal and adhering to recommended goals of 1100-1400 calories and 80+ grams of protein daily.   Behavioral modification strategies: increasing lean protein intake, decreasing simple carbohydrates, and increasing water intake.  Candace [name] has agreed to follow-up with our clinic in 3 to 4 weeks. She was informed of the importance of frequent follow-up visits to maximize her success with intensive lifestyle modifications for her multiple health conditions.   Objective:   Blood pressure  115/80, pulse 74, temperature 98 F (36.7 C), height 5\' 2"  (1.575 m), weight 195 lb (88.5 kg), SpO2 99 %. Body mass index is 35.67 kg/m.  General: Cooperative, alert, well developed, in no acute distress. HEENT: Conjunctivae and lids unremarkable. Cardiovascular: Regular rhythm.  Lungs: Normal work of breathing. Neurologic: No focal deficits.   Lab Results  Component Value Date   CREATININE 0.90 11/05/2020   BUN 14 11/05/2020   NA 138 11/05/2020   K 4.0 11/05/2020   CL 105 11/05/2020   CO2 26 11/05/2020   Lab Results  Component Value Date   ALT 15 06/13/2020   AST 15 06/13/2020   ALKPHOS 65 06/13/2020   BILITOT 1.0 06/13/2020   Lab Results  Component Value Date   HGBA1C 5.6 06/13/2020   HGBA1C 5.7 (H) 02/23/2020   HGBA1C 5.6 04/05/2012   Lab Results  Component Value Date   INSULIN 5.1 06/13/2020   INSULIN 10.8 02/23/2020   Lab Results  Component Value Date   TSH 0.960 08/10/2020   Lab Results  Component Value Date   CHOL 154 09/20/2020   HDL 68 09/20/2020   LDLCALC 78 09/20/2020   LDLDIRECT 112 (H) 09/10/2011   TRIG 32 09/20/2020   CHOLHDL 2.3 09/20/2020   Lab Results  Component Value Date   VD25OH 21.7 (L) 06/13/2020   VD25OH 9.1 (L) 02/23/2020   Lab Results  Component Value Date   WBC 5.5 11/05/2020   HGB 12.7 11/05/2020   HCT 40.3 11/05/2020   MCV 91.0 11/05/2020   PLT 236 11/05/2020   Lab Results  Component Value Date   IRON 44 09/10/2011   TIBC  391 09/10/2011   Attestation Statements:   Reviewed by clinician on day of visit: allergies, medications, problem list, medical history, surgical history, family history, social history, and previous encounter notes.  Time spent on visit including pre-visit chart review and post-visit care and charting was 30 minutes.    I, Burt Knack, am acting as transcriptionist for Quillian Quince, MD.  I have reviewed the above documentation for accuracy and completeness, and I agree with the above. -   Quillian Quince, MD

## 2020-12-13 ENCOUNTER — Ambulatory Visit (INDEPENDENT_AMBULATORY_CARE_PROVIDER_SITE_OTHER): Payer: No Typology Code available for payment source | Admitting: *Deleted

## 2020-12-13 ENCOUNTER — Other Ambulatory Visit: Payer: Self-pay

## 2020-12-13 VITALS — HR 69 | Ht 62.0 in | Wt 196.0 lb

## 2020-12-13 DIAGNOSIS — Z79899 Other long term (current) drug therapy: Secondary | ICD-10-CM | POA: Diagnosis not present

## 2020-12-13 DIAGNOSIS — R002 Palpitations: Secondary | ICD-10-CM

## 2020-12-13 NOTE — Progress Notes (Signed)
Pt came in today for EKG due to Flecainide increase from 50 mg bid to 100 mg bid on 11/30/20 Per pt did not take meds this am due to HR running 47-49  B/P is fine Per pt HR jumps up then will drop and pt will feel dizzy and will experience  some chest pain as well .Per pt noted HR in the 60's when was taking the 50 mg bid but HR remained in the 120's Discussed with Dr Izora Ribas no changes at this time and will send message  to Dr Ladona Ridgel for recommendations .Zack Seal

## 2020-12-21 DIAGNOSIS — I456 Pre-excitation syndrome: Secondary | ICD-10-CM

## 2020-12-21 DIAGNOSIS — Z5181 Encounter for therapeutic drug level monitoring: Secondary | ICD-10-CM

## 2020-12-21 DIAGNOSIS — Z79899 Other long term (current) drug therapy: Secondary | ICD-10-CM

## 2020-12-24 ENCOUNTER — Other Ambulatory Visit (HOSPITAL_COMMUNITY): Payer: Self-pay

## 2020-12-24 MED ORDER — CARVEDILOL 3.125 MG PO TABS
3.1250 mg | ORAL_TABLET | Freq: Two times a day (BID) | ORAL | 11 refills | Status: DC
  Filled 2020-12-24: qty 60, 30d supply, fill #0

## 2020-12-25 ENCOUNTER — Telehealth: Payer: Self-pay

## 2020-12-25 DIAGNOSIS — I456 Pre-excitation syndrome: Secondary | ICD-10-CM

## 2020-12-25 NOTE — Telephone Encounter (Signed)
Sign attestation 

## 2020-12-26 ENCOUNTER — Other Ambulatory Visit (HOSPITAL_COMMUNITY): Payer: Self-pay

## 2021-01-01 ENCOUNTER — Ambulatory Visit (INDEPENDENT_AMBULATORY_CARE_PROVIDER_SITE_OTHER): Payer: No Typology Code available for payment source | Admitting: Family Medicine

## 2021-01-04 ENCOUNTER — Other Ambulatory Visit (HOSPITAL_COMMUNITY): Payer: Self-pay

## 2021-01-04 ENCOUNTER — Other Ambulatory Visit (INDEPENDENT_AMBULATORY_CARE_PROVIDER_SITE_OTHER): Payer: Self-pay | Admitting: Family Medicine

## 2021-01-04 DIAGNOSIS — E559 Vitamin D deficiency, unspecified: Secondary | ICD-10-CM

## 2021-01-04 MED FILL — Atorvastatin Calcium Tab 40 MG (Base Equivalent): ORAL | 90 days supply | Qty: 90 | Fill #1 | Status: AC

## 2021-01-07 ENCOUNTER — Other Ambulatory Visit (INDEPENDENT_AMBULATORY_CARE_PROVIDER_SITE_OTHER): Payer: Self-pay | Admitting: Family Medicine

## 2021-01-07 ENCOUNTER — Other Ambulatory Visit (HOSPITAL_COMMUNITY): Payer: Self-pay

## 2021-01-07 DIAGNOSIS — E559 Vitamin D deficiency, unspecified: Secondary | ICD-10-CM

## 2021-01-07 NOTE — Telephone Encounter (Signed)
Will refill at 01/08/21 visit

## 2021-01-07 NOTE — Telephone Encounter (Signed)
Last OV with Dr. Beasley 

## 2021-01-08 ENCOUNTER — Other Ambulatory Visit (HOSPITAL_COMMUNITY): Payer: Self-pay

## 2021-01-08 ENCOUNTER — Other Ambulatory Visit (INDEPENDENT_AMBULATORY_CARE_PROVIDER_SITE_OTHER): Payer: Self-pay | Admitting: Family Medicine

## 2021-01-08 ENCOUNTER — Ambulatory Visit (INDEPENDENT_AMBULATORY_CARE_PROVIDER_SITE_OTHER): Payer: No Typology Code available for payment source | Admitting: Family Medicine

## 2021-01-08 DIAGNOSIS — E559 Vitamin D deficiency, unspecified: Secondary | ICD-10-CM

## 2021-01-09 ENCOUNTER — Other Ambulatory Visit (HOSPITAL_COMMUNITY): Payer: Self-pay

## 2021-01-09 ENCOUNTER — Encounter (INDEPENDENT_AMBULATORY_CARE_PROVIDER_SITE_OTHER): Payer: Self-pay

## 2021-01-15 ENCOUNTER — Other Ambulatory Visit: Payer: Self-pay

## 2021-01-15 ENCOUNTER — Ambulatory Visit (INDEPENDENT_AMBULATORY_CARE_PROVIDER_SITE_OTHER): Payer: No Typology Code available for payment source

## 2021-01-15 DIAGNOSIS — Z5181 Encounter for therapeutic drug level monitoring: Secondary | ICD-10-CM | POA: Diagnosis not present

## 2021-01-15 DIAGNOSIS — Z79899 Other long term (current) drug therapy: Secondary | ICD-10-CM | POA: Diagnosis not present

## 2021-01-15 DIAGNOSIS — I456 Pre-excitation syndrome: Secondary | ICD-10-CM | POA: Diagnosis not present

## 2021-01-15 LAB — EXERCISE TOLERANCE TEST
Angina Index: 0
Duke Treadmill Score: 7
Estimated workload: 7.7
Exercise duration (min): 6 min
Exercise duration (sec): 30 s
MPHR: 185 {beats}/min
Peak HR: 148 {beats}/min
Percent HR: 80 %
RPE: 19
Rest HR: 98 {beats}/min
ST Depression (mm): 0 mm

## 2021-01-25 ENCOUNTER — Ambulatory Visit: Payer: No Typology Code available for payment source | Admitting: Internal Medicine

## 2021-01-30 ENCOUNTER — Ambulatory Visit (INDEPENDENT_AMBULATORY_CARE_PROVIDER_SITE_OTHER): Payer: No Typology Code available for payment source | Admitting: Internal Medicine

## 2021-01-30 ENCOUNTER — Other Ambulatory Visit: Payer: Self-pay

## 2021-01-30 ENCOUNTER — Encounter: Payer: Self-pay | Admitting: Internal Medicine

## 2021-01-30 VITALS — BP 118/68 | HR 84 | Ht 62.5 in | Wt 197.6 lb

## 2021-01-30 DIAGNOSIS — Z79899 Other long term (current) drug therapy: Secondary | ICD-10-CM | POA: Diagnosis not present

## 2021-01-30 DIAGNOSIS — I456 Pre-excitation syndrome: Secondary | ICD-10-CM

## 2021-01-30 NOTE — Patient Instructions (Signed)
Medication Instructions:  Your physician recommends that you continue on your current medications as directed. Please refer to the Current Medication list given to you today.  *If you need a refill on your cardiac medications before your next appointment, please call your pharmacy*   Lab Work: Trough Flecainide level at 8 am prior to your dose that day  If you have labs (blood work) drawn today and your tests are completely normal, you will receive your results only by: MyChart Message (if you have MyChart) OR A paper copy in the mail If you have any lab test that is abnormal or we need to change your treatment, we will call you to review the results.   Testing/Procedures: none   Follow-Up: At [facility], you and your health needs are our priority.  As part of our continuing mission to provide you with exceptional heart care, we have created designated Provider Care Teams.  These Care Teams include your primary Cardiologist (physician) and Advanced Practice Providers (APPs -  Physician Assistants and Nurse Practitioners) who all work together to provide you with the care you need, when you need it.  We recommend signing up for the patient portal called "MyChart".  Sign up information is provided on this After Visit Summary.  MyChart is used to connect with patients for Virtual Visits (Telemedicine).  Patients are able to view lab/test results, encounter notes, upcoming appointments, etc.  Non-urgent messages can be sent to your provider as well.   To learn more about what you can do with MyChart, go to [email or web address].    Your next appointment:   1 year(s)  The format for your next appointment:   In Person  Provider:   Lewayne Bunting, MD   Other Instructions

## 2021-01-30 NOTE — Progress Notes (Signed)
HPI Candace [name] returns today for followup. 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 During RF energy application, the patient's AP conduction would terminate and she would develop junctional rhythm with concern for CHB. The patient's AP was not life threatening with AP block at 300 ms when atrial pacing was carried out. The patient has continued to have palpitations and was started on flecainide 50 mg twice daily. She had persistent AP conduction and the flecainide was increased to 100 bid. She is better. No sustained palpitations.  Allergies  Allergen Reactions   Vicodin [Hydrocodone-Acetaminophen] Other (See Comments)    violent   Percocet [Oxycodone-Acetaminophen]     Violent behaviors   Latex Itching and Rash   Other     Grass      Current Outpatient Medications  Medication Sig Dispense Refill   atorvastatin (LIPITOR) 40 MG tablet TAKE 1 TABLET (40 MG TOTAL) BY MOUTH DAILY. 90 tablet 3   flecainide (TAMBOCOR) 100 MG tablet Take 1 tablet (100 mg total) by mouth 2 (two) times daily. 180 tablet 3   levonorgestrel (MIRENA) 20 MCG/24HR IUD 1 each by Intrauterine route once.     Vitamin D, Ergocalciferol, (DRISDOL) 1.25 MG (50000 UNIT) CAPS capsule Take 1 capsule (50,000 Units total) by mouth once a week. 4 capsule 0   No current facility-administered medications for this visit.     Past Medical History:  Diagnosis Date   Allergy    Anemia    Anxiety    BV (bacterial vaginosis) 12/15/2012   Chest pain    Diabetes (HCC)    borderline DM per patient - denies DM   Eczema    GERD (gastroesophageal reflux disease)    Headache(784.0)    History of chlamydia    History of gonorrhea    Hyperlipidemia    Phreesia 09/03/2020   Irregular heartbeat    Left bundle branch block    Shortness of breath    Tachycardia    Vaginal itching 12/15/2012   Vaginal Pap smear,  abnormal    f/u ok    ROS:   All systems reviewed and negative except as noted in the HPI.   Past Surgical History:  Procedure Laterality Date   76 HOUR PH STUDY N/A 07/25/2020   Procedure: 24 HOUR PH STUDY;  Surgeon: Shellia Cleverly, DO;  Location: [facility];  Service: Gastroenterology;  Laterality: N/A;   CERVICAL CONIZATION W/BX N/A 12/22/2018   Procedure: LASER ABLATION OF CERVIX;  Surgeon: Lazaro Arms, MD;  Location: [facility];  Service: Gynecology;  Laterality: N/A;   DILATION AND CURETTAGE OF UTERUS N/A 11/22/2012   Procedure: SUCTION DILATATION AND CURETTAGE;  Surgeon: Lazaro Arms, MD;  Location: [facility];  Service: Gynecology;  Laterality: N/A;   ESOPHAGEAL MANOMETRY N/A 07/25/2020   Procedure: ESOPHAGEAL MANOMETRY (EM);  Surgeon: Shellia Cleverly, DO;  Location: [facility];  Service: Gastroenterology;  Laterality: N/A;   SVT ABLATION N/A 11/05/2020   Procedure: SVT ABLATION;  Surgeon: Marinus Maw, MD;  Location: [facility];  Service: Cardiovascular;  Laterality: N/A;     Family History  Problem Relation Age of Onset   Prostate cancer Paternal Grandfather    Breast cancer Paternal Grandmother    Lung cancer Paternal Grandmother    Lung cancer Maternal Grandfather    Colon polyps Father  Prostate cancer Father    Healthy Brother    Healthy Daughter    Healthy Son    Breast cancer Maternal Aunt        Multiple with Breast Cancer   Cervical cancer Maternal Aunt    Stroke Maternal Aunt    Hypertension Other    Diabetes Other    Other Other        stomach tumors   Heart disease Other        her mom, gma and aunts have arrythmia   Kidney disease Paternal Aunt    Colon cancer Paternal Aunt        colon problems   Stroke Paternal Uncle        PGreatUncle   Prostate cancer Paternal Uncle    Breast cancer Cousin    Esophageal cancer Neg Hx    Rectal cancer Neg Hx    Stomach cancer Neg Hx      Social History   Socioeconomic History    Marital status: Single    Spouse name: Not on file   Number of children: 2   Years of education: Not on file   Highest education level: Some college, no degree  Occupational History   Occupation: CNA    Comment: [employer]  Tobacco Use   Smoking status: Never   Smokeless tobacco: Never  Vaping Use   Vaping Use: Never used  Substance and Sexual Activity   Alcohol use: Not Currently    Comment: socially    Drug use: No   Sexual activity: Yes    Birth control/protection: I.U.D.  Other Topics Concern   Not on file  Social History Narrative   Not on file   Social Determinants of Health   Financial Resource Strain: Not on file  Food Insecurity: Not on file  Transportation Needs: Not on file  Physical Activity: Not on file  Stress: Not on file  Social Connections: Not on file  Intimate Partner Violence: Not on file     BP 118/68   Pulse 84   Ht 5' 2.5" (1.588 m)   Wt 197 lb 9.6 oz (89.6 kg)   SpO2 98%   BMI 35.57 kg/m   Physical Exam:  Well appearing NAD HEENT: Unremarkable Neck:  No JVD, no thyromegally Lymphatics:  No adenopathy Back:  No CVA tenderness Lungs:  Clear with no wheezes HEART:  Regular rate rhythm, no murmurs, no rubs, no clicks Abd:  soft, positive bowel sounds, no organomegally, no rebound, no guarding Ext:  2 plus pulses, no edema, no cyanosis, no clubbing Skin:  No rashes no nodules Neuro:  CN II through XII intact, motor grossly intact  EKG - nsr with RBBB   Assess/Plan:  WPW syndrome - The patient has persistent AP conduction. I have asked her to increase her Flecainide to 100 mg twice daily. She will return in a year for followup and we will have get a trough flecainide level in the next couple of weeks.  Obesity - she states that she has lost 40 lbs. I encouraged her to continue.      Sharlot Gowda Consuela Widener,MD

## 2021-02-04 ENCOUNTER — Other Ambulatory Visit: Payer: No Typology Code available for payment source | Admitting: *Deleted

## 2021-02-04 ENCOUNTER — Other Ambulatory Visit: Payer: Self-pay

## 2021-02-04 DIAGNOSIS — Z79899 Other long term (current) drug therapy: Secondary | ICD-10-CM

## 2021-02-04 DIAGNOSIS — I456 Pre-excitation syndrome: Secondary | ICD-10-CM

## 2021-02-08 LAB — FLECAINIDE LEVEL: Flecainide: 0.41 ug/mL (ref 0.20–1.00)

## 2021-03-07 ENCOUNTER — Other Ambulatory Visit (HOSPITAL_COMMUNITY): Payer: Self-pay

## 2021-03-29 ENCOUNTER — Encounter: Payer: Self-pay | Admitting: Internal Medicine

## 2021-03-29 ENCOUNTER — Encounter: Payer: Self-pay | Admitting: Family

## 2021-04-01 ENCOUNTER — Ambulatory Visit (INDEPENDENT_AMBULATORY_CARE_PROVIDER_SITE_OTHER): Payer: No Typology Code available for payment source | Admitting: Family

## 2021-04-01 ENCOUNTER — Encounter: Payer: Self-pay | Admitting: Family

## 2021-04-01 VITALS — BP 94/66 | HR 96 | Temp 98.1°F | Resp 19 | Ht 62.52 in | Wt 193.0 lb

## 2021-04-01 DIAGNOSIS — K219 Gastro-esophageal reflux disease without esophagitis: Secondary | ICD-10-CM

## 2021-04-01 DIAGNOSIS — Z975 Presence of (intrauterine) contraceptive device: Secondary | ICD-10-CM | POA: Diagnosis not present

## 2021-04-01 DIAGNOSIS — R110 Nausea: Secondary | ICD-10-CM

## 2021-04-01 NOTE — Telephone Encounter (Signed)
I would avoid ondansetron since she is on flecainide. Actually, all of the anti-emetics can affect cardiac conduction, I think - good question for Dr. Ladona Ridgel.  Would speak with PCP or GI doc about other options.  Dr Rexene Edison

## 2021-04-01 NOTE — Progress Notes (Signed)
Patient ID: Candace [name], female    DOB: 12-Sep-1985  MRN: [id number]  CC: GERD  Subjective: Candace [name] is a 35 y.o. female who presents for nausea.   Her concerns today include:  GERD: NAUSEA: Reports acid reflux causing nausea. Followed by Gastroenterology in the past, last time being January 2022.   3. MIRENA: Reports concerns for Mirena not being in correct positioning. Reports can feel Mirena moving during intercourse. Followed by [facility] in [place], [place]. Reports calling their office without response.     Patient Active Problem List   Diagnosis Date Noted   WPW (Wolff-Parkinson-White syndrome) 11/30/2020   Palpitations 10/02/2020   Gastroesophageal reflux disease    Cough    Regurgitation and rechewing    History of abnormal cervical Pap smear 06/13/2019   Encounter for gynecological examination with Papanicolaou smear of cervix 06/13/2019   IUD (intrauterine device) in place 06/13/2019   Encounter for insertion of mirena IUD 01/28/2019   Prediabetes    Abnormal Pap smear of cervix 11/01/2013   Hyperlipidemia 10/17/2011   Iron deficiency anemia 09/10/2011   Eczema 09/10/2011   Seasonal allergies 09/10/2011   Obesity 09/10/2011     Current Outpatient Medications on File Prior to Visit  Medication Sig Dispense Refill   atorvastatin (LIPITOR) 40 MG tablet TAKE 1 TABLET (40 MG TOTAL) BY MOUTH DAILY. 90 tablet 3   flecainide (TAMBOCOR) 100 MG tablet Take 1 tablet (100 mg total) by mouth 2 (two) times daily. 180 tablet 3   levonorgestrel (MIRENA) 20 MCG/24HR IUD 1 each by Intrauterine route once.     Vitamin D, Ergocalciferol, (DRISDOL) 1.25 MG (50000 UNIT) CAPS capsule Take 1 capsule (50,000 Units total) by mouth once a week. 4 capsule 0   No current facility-administered medications on file prior to visit.    Allergies  Allergen Reactions   Vicodin [Hydrocodone-Acetaminophen] Other (See Comments)    violent   Percocet  [Oxycodone-Acetaminophen]     Violent behaviors   Latex Itching and Rash   Other     Grass     Social History   Socioeconomic History   Marital status: Single    Spouse name: Not on file   Number of children: 2   Years of education: Not on file   Highest education level: Some college, no degree  Occupational History   Occupation: CNA    Comment: [employer]  Tobacco Use   Smoking status: Never   Smokeless tobacco: Never  Vaping Use   Vaping Use: Never used  Substance and Sexual Activity   Alcohol use: Not Currently    Comment: socially    Drug use: No   Sexual activity: Yes    Birth control/protection: I.U.D.  Other Topics Concern   Not on file  Social History Narrative   Not on file   Social Determinants of Health   Financial Resource Strain: Not on file  Food Insecurity: Not on file  Transportation Needs: Not on file  Physical Activity: Not on file  Stress: Not on file  Social Connections: Not on file  Intimate Partner Violence: Not on file    Family History  Problem Relation Age of Onset   Prostate cancer Paternal Grandfather    Breast cancer Paternal Grandmother    Lung cancer Paternal Grandmother    Lung cancer Maternal Grandfather    Colon polyps Father    Prostate cancer Father    Healthy Brother    Healthy Daughter  Healthy Son    Breast cancer Maternal Aunt        Multiple with Breast Cancer   Cervical cancer Maternal Aunt    Stroke Maternal Aunt    Hypertension Other    Diabetes Other    Other Other        stomach tumors   Heart disease Other        her mom, gma and aunts have arrythmia   Kidney disease Paternal Aunt    Colon cancer Paternal Aunt        colon problems   Stroke Paternal Uncle        PGreatUncle   Prostate cancer Paternal Uncle    Breast cancer Cousin    Esophageal cancer Neg Hx    Rectal cancer Neg Hx    Stomach cancer Neg Hx     Past Surgical History:  Procedure Laterality Date   66 HOUR [place] STUDY N/A  07/25/2020   Procedure: 24 HOUR [place] STUDY;  Surgeon: Lavena Bullion, DO;  Location: [facility];  Service: Gastroenterology;  Laterality: N/A;   CERVICAL CONIZATION W/BX N/A 12/22/2018   Procedure: LASER ABLATION OF CERVIX;  Surgeon: Florian Buff, MD;  Location: [facility];  Service: Gynecology;  Laterality: N/A;   DILATION AND CURETTAGE OF UTERUS N/A 11/22/2012   Procedure: SUCTION DILATATION AND CURETTAGE;  Surgeon: Florian Buff, MD;  Location: [facility];  Service: Gynecology;  Laterality: N/A;   ESOPHAGEAL MANOMETRY N/A 07/25/2020   Procedure: ESOPHAGEAL MANOMETRY (EM);  Surgeon: Lavena Bullion, DO;  Location: [facility];  Service: Gastroenterology;  Laterality: N/A;   SVT ABLATION N/A 11/05/2020   Procedure: SVT ABLATION;  Surgeon: Evans Lance, MD;  Location: [facility];  Service: Cardiovascular;  Laterality: N/A;    ROS: Review of Systems Negative except as stated above  PHYSICAL EXAM: BP 94/66 (BP Location: Left Arm, Patient Position: Sitting, Cuff Size: Large)   Pulse 96   Temp 98.1 F (36.7 C)   Resp 19   Ht 5' 2.52" (1.588 m)   Wt 193 lb (87.5 kg)   SpO2 98%   BMI 34.72 kg/m   Physical Exam HENT:     Head: Normocephalic and atraumatic.  Eyes:     Extraocular Movements: Extraocular movements intact.     Conjunctiva/sclera: Conjunctivae normal.     Pupils: Pupils are equal, round, and reactive to light.  Cardiovascular:     Rate and Rhythm: Normal rate and regular rhythm.     Pulses: Normal pulses.     Heart sounds: Normal heart sounds.  Pulmonary:     Effort: Pulmonary effort is normal.     Breath sounds: Normal breath sounds.  Musculoskeletal:     Cervical back: Normal range of motion and neck supple.  Neurological:     General: No focal deficit present.     Mental Status: She is alert and oriented to person, place, and time.  Psychiatric:        Mood and Affect: Mood normal.        Behavior: Behavior normal.    ASSESSMENT AND PLAN: 1.  Gastroesophageal reflux disease, unspecified whether esophagitis present: 2. Nausea: - Per Lyman Bishop, MD at Cardiology recommendation to avoid Ondansetron and all of the anti-emetics. Patient is taking Flecainide (for management of Wolff-Parkinson-White syndrome) which may interact with anti-emetics and can affect cardiac conduction. - Referral to Gastroenterology for further evaluation and management.  - Ambulatory referral to Gastroenterology  3. IUD (  intrauterine device) in place: - CMA was able to reach patients OB/GYN at [facility] in [place], [place] and appointment scheduled on behalf of patient with Derrek Monaco, NP on 04/02/2021.     Patient was given the opportunity to ask questions.  Patient verbalized understanding of the plan and was able to repeat key elements of the plan. Patient was given clear instructions to go to Emergency Department or return to medical center if symptoms don't improve, worsen, or new problems develop.The patient verbalized understanding.   Orders Placed This Encounter  Procedures   Ambulatory referral to Gastroenterology     Requested Prescriptions    No prescriptions requested or ordered in this encounter    Follow-up with primary provider as scheduled.   Camillia Herter, NP

## 2021-04-01 NOTE — Progress Notes (Signed)
Pt presents for nausea states that cardiology advised speak to PCP in regards to medication

## 2021-04-01 NOTE — Patient Instructions (Signed)
Levonorgestrel Intrauterine Device ?What is this medication? ?LEVONORGESTREL (LEE voe nor jes trel) prevents ovulation and pregnancy. It may also be used to treat heavy periods. It belongs to a group of medications called contraceptives. This medication is a progestin hormone. ?This medicine may be used for other purposes; ask your health care provider or pharmacist if you have questions. ?COMMON BRAND NAME(S): Kyleena, LILETTA, Mirena, Skyla ?What should I tell my care team before I take this medication? ?They need to know if you have any of these conditions: ?Abnormal Pap smear ?Cancer of the breast, uterus, or cervix ?Diabetes ?Endometritis ?Genital or pelvic infection now or in the past ?Have more than one sexual partner or your partner has more than one partner ?Heart disease ?History of an ectopic or tubal pregnancy ?Immune system problems ?IUD in place ?Liver disease or tumor ?Problems with blood clots or take blood-thinners ?Seizures ?Use intravenous drugs ?Uterus of unusual shape ?Vaginal bleeding that has not been explained ?An unusual or allergic reaction to levonorgestrel, other hormones, silicone, or polyethylene, medications, foods, dyes, or preservatives ?Pregnant or trying to get pregnant ?Breast-feeding ?How should I use this medication? ?This device is placed inside the uterus by your care team. ?A patient package insert for the product will be given each time it is inserted. Be sure to read this information carefully each time. The sheet may change often. ?Talk to your care team about use of this medication in children. Special care may be needed. ?Overdosage: If you think you have taken too much of this medicine contact a poison control center or emergency room at once. ?NOTE: This medicine is only for you. Do not share this medicine with others. ?What if I miss a dose? ?This does not apply. Depending on the brand of device you have inserted, the device will need to be replaced every 3 to 8 years  if you wish to continue using this type of birth control. ?What may interact with this medication? ?Interactions are not expected. Tell your care team about all the medications you take. ?This list may not describe all possible interactions. Give your health care provider a list of all the medicines, herbs, non-prescription drugs, or dietary supplements you use. Also tell them if you smoke, drink alcohol, or use illegal drugs. Some items may interact with your medicine. ?What should I watch for while using this medication? ?Visit your care team for regular check-ups. Tell your care team if you or your partner becomes HIV positive or gets a sexually transmitted disease. ?Using this medication does not protect you or your partner against HIV or other sexually transmitted infections (STIs). ?You can check the placement of the IUD yourself by reaching up to the top of your vagina with clean fingers to feel the threads. Do not pull on the threads. It is a good habit to check placement after each menstrual period. Call your care team right away if you feel more of the IUD than just the threads or if you cannot feel the threads at all. ?The IUD may come out by itself. You may become pregnant if the device comes out. If you notice that the IUD has come out use a backup birth control method like condoms and call your care team. ?Using tampons will not change the position of the IUD and are okay to use during your period. ?This IUD can be safely scanned with magnetic resonance imaging (MRI) only under specific conditions. Before you have an MRI, tell your care team that   you have an IUD in place, and which type of IUD you have in place. ?What side effects may I notice from receiving this medication? ?Side effects that you should report to your care team as soon as possible: ?Allergic reactions--skin rash, itching, hives, swelling of the face, lips, tongue, or throat ?Blood clot--pain, swelling, or warmth in the leg, shortness  of breath, chest pain ?Gallbladder problems--severe stomach pain, nausea, vomiting, fever ?Increase in blood pressure ?Liver injury--right upper belly pain, loss of appetite, nausea, light-colored stool, dark yellow or brown urine, yellowing skin or eyes, unusual weakness or fatigue ?New or worsening migraines or headaches ?Pelvic inflammatory disease (PID)--fever, abdominal pain, pelvic pain, pain or trouble passing urine, spotting, bleeding during or after sex ?Stroke--sudden numbness or weakness of the face, arm, or leg, trouble speaking, confusion, trouble walking, loss of balance or coordination, dizziness, severe headache, change in vision ?Unusual vaginal discharge, itching, or odor ?Vaginal pain, irritation, or sores ?Worsening mood, feelings of depression ?Side effects that usually do not require medical attention (report to your care team if they continue or are bothersome): ?Breast pain or tenderness ?Dark patches of skin on the face or other sun-exposed areas ?Irregular menstrual cycles or spotting ?Nausea ?Weight gain ?This list may not describe all possible side effects. Call your doctor for medical advice about side effects. You may report side effects to FDA at 1-800-FDA-1088. ?Where should I keep my medication? ?This does not apply. ?NOTE: This sheet is a summary. It may not cover all possible information. If you have questions about this medicine, talk to your doctor, pharmacist, or health care provider. ?? 2022 Elsevier/Gold Standard (2021-01-09 00:00:00) ? ?

## 2021-04-02 ENCOUNTER — Encounter: Payer: Self-pay | Admitting: Adult Health

## 2021-04-02 ENCOUNTER — Ambulatory Visit (INDEPENDENT_AMBULATORY_CARE_PROVIDER_SITE_OTHER): Payer: No Typology Code available for payment source | Admitting: Adult Health

## 2021-04-02 ENCOUNTER — Other Ambulatory Visit: Payer: Self-pay

## 2021-04-02 VITALS — BP 101/71 | HR 80 | Ht 62.25 in | Wt 194.4 lb

## 2021-04-02 DIAGNOSIS — Z975 Presence of (intrauterine) contraceptive device: Secondary | ICD-10-CM | POA: Diagnosis not present

## 2021-04-02 DIAGNOSIS — N926 Irregular menstruation, unspecified: Secondary | ICD-10-CM | POA: Diagnosis not present

## 2021-04-02 LAB — POCT URINE PREGNANCY: Preg Test, Ur: NEGATIVE

## 2021-04-02 NOTE — Progress Notes (Signed)
  Subjective:     Patient ID: Candace [name], female   DOB: Oct 22, 1985, 35 y.o.   MRN: [id number]  HPI Candace [name] is a 35 year old black female,single, G3P2012, in complaining of IUD issues, she feels like it has moved, and partner says he feels it. PCP is Ricky Stabs NP.  Lab Results  Component Value Date   DIAGPAP  12/12/2019    - Negative for intraepithelial lesion or malignancy (NILM)   HPVHIGH Negative 12/12/2019    Review of Systems Felt like IUD moved Partner says he feels it Has had periods then stopped having periods Reviewed past medical,surgical, social and family history. Reviewed medications and allergies.     Objective:   Physical Exam BP 101/71 (BP Location: Right Arm, Patient Position: Sitting, Cuff Size: Large)   Pulse 80   Ht 5' 2.25" (1.581 m)   Wt 194 lb 6.4 oz (88.2 kg)   BMI 35.27 kg/m    UPT is negative . Skin warm and dry. Neck: mid line trachea, normal thyroid, good ROM, no lymphadenopathy noted. Lungs: clear to ausculation bilaterally. Cardiovascular: regular rate and rhythm..   Pelvic: external genitalia is normal in appearance no lesions, vagina: pik\nk with good moisture and rugae,urethra has no lesions or masses noted, cervix:smooth and bulbous, +shirt IUD string at os, uterus: normal size, shape and contour, non tender, no masses felt,POC US shows IUD in place, adnexa: no masses or tenderness noted. Bladder is non tender and no masses felt. Co exam with Hubbard Robinson NP student   [facility] - 04/02/21 0950       Pregnancy Intention Screening   Does the patient want to become pregnant in the next year? No    Does the patient's partner want to become pregnant in the next year? No    Would the patient like to discuss contraceptive options today? No      Contraception Wrap Up   Current Method IUD or IUS    End Method IUD or IUS    Contraception Counseling Provided No             Assessment:     1. Missed periods   2. IUD (intrauterine  device) in place Placed 01/28/2019    Plan:     Return 04/22/21 for pap and physical

## 2021-04-11 ENCOUNTER — Other Ambulatory Visit (HOSPITAL_COMMUNITY): Payer: Self-pay

## 2021-04-11 MED FILL — Atorvastatin Calcium Tab 40 MG (Base Equivalent): ORAL | 90 days supply | Qty: 90 | Fill #2 | Status: AC

## 2021-04-22 ENCOUNTER — Encounter: Payer: Self-pay | Admitting: Adult Health

## 2021-04-22 ENCOUNTER — Other Ambulatory Visit (HOSPITAL_COMMUNITY)
Admission: RE | Admit: 2021-04-22 | Discharge: 2021-04-22 | Disposition: A | Discharge status: Home / Self Care | Payer: No Typology Code available for payment source | Source: Ambulatory Visit | Attending: Adult Health | Admitting: Adult Health

## 2021-04-22 ENCOUNTER — Ambulatory Visit (INDEPENDENT_AMBULATORY_CARE_PROVIDER_SITE_OTHER): Payer: No Typology Code available for payment source | Admitting: Adult Health

## 2021-04-22 ENCOUNTER — Other Ambulatory Visit: Payer: Self-pay

## 2021-04-22 VITALS — BP 108/73 | HR 97 | Ht 62.25 in | Wt 201.0 lb

## 2021-04-22 DIAGNOSIS — Z01419 Encounter for gynecological examination (general) (routine) without abnormal findings: Secondary | ICD-10-CM | POA: Insufficient documentation

## 2021-04-22 DIAGNOSIS — Z8742 Personal history of other diseases of the female genital tract: Secondary | ICD-10-CM | POA: Insufficient documentation

## 2021-04-22 DIAGNOSIS — Z975 Presence of (intrauterine) contraceptive device: Secondary | ICD-10-CM | POA: Diagnosis not present

## 2021-04-22 NOTE — Progress Notes (Signed)
Patient ID: Candace [name], female   DOB: 1986-01-01, 35 y.o.   MRN: [id number] History of Present Illness: Candace [name] is a 35 year old black female,single, G3P2012 in for a well woman gyn exam and pap. 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 Lab Results  Component Value Date   DIAGPAP  12/12/2019    - Negative for intraepithelial lesion or malignancy (NILM)   HPVHIGH Negative 12/12/2019   PCP is Amy Zonia Kief NP.    Current Medications, Allergies, Past Medical History, Past Surgical History, Family History and Social History were reviewed in [employer] record.     Review of Systems: Patient denies any headaches, hearing loss, fatigue, blurred vision, shortness of breath, chest pain, abdominal pain, problems with bowel movements, urination, or intercourse. No joint pain or mood swings.  Period blood in dark   Physical Exam:,BP 108/73 (BP Location: Left Arm, Patient Position: Sitting, Cuff Size: Large)   Pulse 97   Ht 5' 2.25" (1.581 m)   Wt 201 lb (91.2 kg)   LMP 04/22/2021 (Exact Date)   BMI 36.47 kg/m   General:  Well developed, well nourished, no acute distress Skin:  Warm and dry Neck:  Midline trachea, normal thyroid, good ROM, no lymphadenopathy Lungs; Clear to auscultation bilaterally Breast:  No dominant palpable mass, retraction, or nipple discharge Cardiovascular: Regular rate and rhythm Abdomen:  Soft, non tender, no hepatosplenomegaly Pelvic:  External genitalia is normal in appearance, no lesions.  The vagina is normal in appearance, +period blood. Urethra has no lesions or masses. The cervix is bulbous. +IUD strings at os, pap with HR HPV genotyping performed. Uterus is felt to be normal size, shape, and contour.  No adnexal masses or tenderness noted.Bladder is non tender, no masses felt. Rectal: Deferred. Extremities/musculoskeletal:  No swelling or varicosities noted, no clubbing or cyanosis Psych:  No mood changes, alert and  cooperative,seems happy AA is 0  Fall risk is low Depression screen [facility] 2/9 04/22/2021 04/01/2021 09/05/2020  Decreased Interest 0 0 0  Down, Depressed, Hopeless 0 0 0  PHQ - 2 Score 0 0 0  Altered sleeping 1 - 0  Tired, decreased energy 0 - 0  Change in appetite 0 - 0  Feeling bad or failure about yourself  0 - 0  Trouble concentrating 0 - 0  Moving slowly or fidgety/restless 0 - 0  Suicidal thoughts 0 - 0  PHQ-9 Score 1 - 0  Difficult doing work/chores - - Not difficult at all    GAD 7 : Generalized Anxiety Score 04/22/2021  Nervous, Anxious, on Edge 0  Control/stop worrying 0  Worry too much - different things 0  Trouble relaxing 0  Restless 0  Easily annoyed or irritable 0  Afraid - awful might happen 0  Total GAD 7 Score 0      [facility] - 04/22/21 1427       Pregnancy Intention Screening   Does the patient want to become pregnant in the next year? Unsure    Does the patient's partner want to become pregnant in the next year? Unsure    Would the patient like to discuss contraceptive options today? No      Contraception Wrap Up   Current Method IUD or IUS    End Method IUD or IUS    Contraception Counseling Provided No            Examination chaperoned by Clint Bolder RN  Impression and Plan: 1.  Encounter for gynecological examination with Papanicolaou smear of cervix Pap sent Pap and physical in 1 year Labs with PCP  2. History of abnormal cervical Pap smear Pap sent   3. IUD (intrauterine device) in place

## 2021-04-26 LAB — CYTOLOGY - PAP
Comment: NEGATIVE
Diagnosis: NEGATIVE
High risk HPV: NEGATIVE

## 2021-06-07 ENCOUNTER — Other Ambulatory Visit (HOSPITAL_COMMUNITY): Payer: Self-pay

## 2021-06-24 ENCOUNTER — Encounter (HOSPITAL_COMMUNITY): Payer: Self-pay

## 2021-06-24 ENCOUNTER — Other Ambulatory Visit: Payer: Self-pay

## 2021-06-24 ENCOUNTER — Emergency Department (HOSPITAL_COMMUNITY): Payer: No Typology Code available for payment source

## 2021-06-24 ENCOUNTER — Emergency Department (HOSPITAL_COMMUNITY)
Admission: EM | Admit: 2021-06-24 | Discharge: 2021-06-24 | Disposition: A | Discharge status: Home / Self Care | Payer: No Typology Code available for payment source | Attending: Emergency Medicine | Admitting: Emergency Medicine

## 2021-06-24 DIAGNOSIS — R0602 Shortness of breath: Secondary | ICD-10-CM | POA: Diagnosis not present

## 2021-06-24 DIAGNOSIS — R519 Headache, unspecified: Secondary | ICD-10-CM | POA: Insufficient documentation

## 2021-06-24 DIAGNOSIS — R079 Chest pain, unspecified: Secondary | ICD-10-CM | POA: Diagnosis present

## 2021-06-24 DIAGNOSIS — Z79899 Other long term (current) drug therapy: Secondary | ICD-10-CM | POA: Insufficient documentation

## 2021-06-24 DIAGNOSIS — Z91040 Latex allergy status: Secondary | ICD-10-CM | POA: Insufficient documentation

## 2021-06-24 DIAGNOSIS — I456 Pre-excitation syndrome: Secondary | ICD-10-CM | POA: Insufficient documentation

## 2021-06-24 LAB — BASIC METABOLIC PANEL
Anion gap: 9 (ref 5–15)
BUN: 13 mg/dL (ref 6–20)
CO2: 23 mmol/L (ref 22–32)
Calcium: 9.1 mg/dL (ref 8.9–10.3)
Chloride: 103 mmol/L (ref 98–111)
Creatinine, Ser: 1.05 mg/dL — ABNORMAL HIGH (ref 0.44–1.00)
GFR, Estimated: >60 mL/min (ref >60)
Glucose, Bld: 86 mg/dL (ref 70–99)
Potassium: 3.8 mmol/L (ref 3.5–5.1)
Sodium: 135 mmol/L (ref 135–145)

## 2021-06-24 LAB — TROPONIN I (HIGH SENSITIVITY)
Troponin I (High Sensitivity): 3 ng/L (ref <18)
Troponin I (High Sensitivity): 3 ng/L (ref <18)

## 2021-06-24 LAB — CBC
HCT: 40.6 % (ref 36.0–46.0)
Hemoglobin: 12.8 g/dL (ref 12.0–15.0)
MCH: 28.9 pg (ref 26.0–34.0)
MCHC: 31.5 g/dL (ref 30.0–36.0)
MCV: 91.6 fL (ref 80.0–100.0)
Platelets: 242 10*3/uL (ref 150–400)
RBC: 4.43 MIL/uL (ref 3.87–5.11)
RDW: 13.3 % (ref 11.5–15.5)
WBC: 5.7 10*3/uL (ref 4.0–10.5)
nRBC: 0 % (ref 0.0–0.2)

## 2021-06-24 LAB — I-STAT BETA HCG BLOOD, ED (MC, WL, AP ONLY): I-stat hCG, quantitative: <5 m[IU]/mL (ref <5)

## 2021-06-24 LAB — MAGNESIUM: Magnesium: 1.9 mg/dL (ref 1.7–2.4)

## 2021-06-24 MED ORDER — ACETAMINOPHEN 500 MG PO TABS
1000.0000 mg | ORAL_TABLET | Freq: Once | ORAL | Status: DC
  Filled 2021-06-24: qty 2

## 2021-06-24 NOTE — ED Provider Notes (Signed)
[facility] EMERGENCY DEPARTMENT Provider Note   CSN: [id number] Arrival date & time: 06/24/21  1258     History  No chief complaint on file.   Candace [name] is a 36 y.o. female with history of anxiety, GERD, Wolff-Parkinson-White syndrome, who presents the emergency department complaining of chest pain and shortness of breath starting this morning.  Patient states that her symptoms were initially intermittent, but have now become constant.  She describes her pain is worse in the central area of her chest.  She states that she last took flecainide this morning at 5:30 AM.  She denies diaphoresis, headache, recent illness.  She endorses nausea and vomiting.  HPI    Past Medical History:  Diagnosis Date   Allergy    Anemia    Anxiety    BV (bacterial vaginosis) 12/15/2012   Chest pain    Diabetes ([place])    borderline DM per patient - denies DM   Eczema    GERD (gastroesophageal reflux disease)    Headache(784.0)    History of chlamydia    History of gonorrhea    Hyperlipidemia    [employer]reesia 09/03/2020   Irregular heartbeat    Left bundle branch block    Shortness of breath    Tachycardia    Vaginal itching 12/15/2012   Vaginal Pap smear, abnormal    f/u ok   WPW (Wolff-Parkinson-White syndrome)      Home Medications Prior to Admission medications   Medication Sig Start Date End Date Taking? Authorizing Provider  atorvastatin (LIPITOR) 40 MG tablet TAKE 1 TABLET (40 MG TOTAL) BY MOUTH DAILY. 07/09/20 07/10/21  Pixie Casino, MD  flecainide (TAMBOCOR) 100 MG tablet Take 1 tablet (100 mg total) by mouth 2 (two) times daily. 11/30/20   Evans Lance, MD  levonorgestrel (MIRENA) 20 MCG/24HR IUD 1 each by Intrauterine route once.    [provider]  Vitamin D, Ergocalciferol, (DRISDOL) 1.25 MG (50000 UNIT) CAPS capsule Take 1 capsule (50,000 Units total) by mouth once a week. 11/29/20   Starlyn Skeans, MD      Allergies    Vicodin  [hydrocodone-acetaminophen], Percocet [oxycodone-acetaminophen], Latex, and Other    Review of Systems   Review of Systems  Constitutional:  Negative for diaphoresis.  Respiratory:  Positive for shortness of breath.   Cardiovascular:  Positive for chest pain.  Gastrointestinal:  Positive for nausea and vomiting.  Neurological:  Negative for headaches.   Physical Exam Updated Vital Signs BP (!) 99/56    Pulse 95    Temp 99.3 F (37.4 C) (Oral)    Resp (!) 21    LMP 06/19/2021 (Exact Date)    SpO2 100%  Physical Exam Vitals and nursing note reviewed.  Constitutional:      Appearance: Normal appearance.  HENT:     Head: Normocephalic and atraumatic.  Eyes:     Conjunctiva/sclera: Conjunctivae normal.  Cardiovascular:     Rate and Rhythm: Normal rate and regular rhythm.  Pulmonary:     Effort: Pulmonary effort is normal. No respiratory distress.     Breath sounds: Normal breath sounds.  Abdominal:     General: There is no distension.     Palpations: Abdomen is soft.     Tenderness: There is no abdominal tenderness.  Skin:    General: Skin is warm and dry.  Neurological:     General: No focal deficit present.     Mental Status: She is alert.  ED Results / Procedures / Treatments   Labs (all labs ordered are listed, but only abnormal results are displayed) Labs Reviewed  BASIC METABOLIC PANEL - Abnormal; Notable for the following components:      Result Value   Creatinine, Ser 1.05 (*)    All other components within normal limits  CBC  MAGNESIUM  I-STAT BETA HCG BLOOD, ED ([facility], [facility], [facility] ONLY)  TROPONIN I (HIGH SENSITIVITY)  TROPONIN I (HIGH SENSITIVITY)    EKG EKG Interpretation  Date/Time:  Monday June 24 2021 13:09:15 EST Ventricular Rate:  107 PR Interval:  166 QRS Duration: 140 QT Interval:  368 QTC Calculation: 491 R Axis:   112 Text Interpretation: Sinus tachycardia Intraventricular conduction delay When compared with ECG of 05-Nov-2020 13:57,  PREVIOUS ECG IS PRESENT Confirmed by Blanchie Dessert [id number]) on 06/24/2021 3:05:14 PM  Radiology DG Chest 2 View  Result Date: 06/24/2021 CLINICAL DATA:  Chest pain EXAM: CHEST - 2 VIEW COMPARISON:  08/06/2020 FINDINGS: The heart size and mediastinal contours are within normal limits. Both lungs are clear. The visualized skeletal structures are unremarkable. IMPRESSION: No active cardiopulmonary disease. Electronically Signed   By: Jerilynn Mages.  Shick M.D.   On: 06/24/2021 15:12    Procedures Procedures    Medications Ordered in ED Medications  acetaminophen (TYLENOL) tablet 1,000 mg (1,000 mg Oral Patient Refused/Not Given 06/24/21 1633)    ED Course/ Medical Decision Making/ A&P                           Medical Decision Making Amount and/or Complexity of Data Reviewed Labs: ordered. Radiology: ordered.   This patient presents to the ED for concern of chest pain, this involves an extensive number of treatment options, and is a complaint that carries with it a high risk of complications and morbidity. The emergent differential diagnosis prior to evaluation includes, but is not limited to,  Acute coronary syndrome, pericarditis, aortic dissection, pulmonary embolism, tension pneumothorax, and esophageal rupture. Other urgent/non-acute considerations include: chronic angina, aortic stenosis, cardiomyopathy, myocarditis, mitral valve prolapse, pulmonary hypertension, hypertrophic obstructive cardiomyopathy (HOCM), aortic insufficiency, right ventricular hypertrophy, pneumonia, pleuritis, bronchitis, pneumothorax, tumor, gastroesophageal reflux disease (GERD), esophageal spasm, Mallory-Weiss syndrome, peptic ulcer disease, biliary disease, pancreatitis, functional gastrointestinal pain, cervical or thoracic disk disease or arthritis, shoulder arthritis, costochondritis, subacromial bursitis, anxiety or panic attack, herpes zoster, breast disorders, chest wall tumors, thoracic outlet syndrome,  mediastinitis.  .   Past Medical History / Co-morbidities: Anxiety, diabetes, GERD, Wolff-Parkinson-White syndrome  Additional history: Additional history obtained from chart review. External records from outside source obtained and reviewed including prior cardiology notes including ablation operative note.  Physical Exam: Physical exam performed. The pertinent findings include: Lung sounds clear to auscultation all fields, heart regular rate and rhythm, good pulses in all extremities.  Lab Tests: I ordered, and personally interpreted labs.  The pertinent results include:  Initial troponin of 3, delta troponin of 3.  No leukocytosis, hemoglobin normal.  Electrolytes grossly within normal limits.   Imaging Studies: I ordered imaging studies including chest x-ray. I independently visualized and interpreted imaging which showed no acute cardiopulmonary abnormalities. I agree with the radiologist interpretation.   Cardiac Monitoring:  The patient was maintained on a cardiac monitor.  My attending physician Dr. Maryan Rued viewed and interpreted the cardiac monitored which showed an underlying rhythm of: sinus rhythym   Medications: I ordered medication including tylenol  for headache. Reevaluation of the patient after these medicines  showed that the patient the patient's headache improved before receiving Tylenol. I have reviewed the patients home medicines and have made adjustments as needed.  Disposition: After consideration of the diagnostic results and the patients response to treatment, I feel that patient is not requiring admission or inpatient treatment for her symptoms.  She states that her chest pain and headache has mostly resolved since ER arrival.  Heart score for Major cardiac events is low at 3. Patient is to be discharged with recommendation to follow up with PCP in regards to today's hospital visit. Chest pain is not likely of cardiac or pulmonary etiology d/t presentation, PERC  negative, VSS, no tracheal deviation, no JVD or new murmur, RRR, breath sounds equal bilaterally, EKG without acute abnormalities, negative troponin, and negative CXR. Pt has been advised to return to the ED if CP becomes exertional, associated with diaphoresis or nausea, radiates to left jaw/arm, worsens or becomes concerning in any way. Pt appears reliable for follow up and is agreeable to discharge.   Case has been discussed with attending physician Dr. Maryan Rued who agrees with the above plan to discharge.    Final Clinical Impression(s) / ED Diagnoses Final diagnoses:  Chest pain, unspecified type  Acute nonintractable headache, unspecified headache type    Rx / DC Orders ED Discharge Orders     None      Portions of this report may have been transcribed using voice recognition software. Every effort was made to ensure accuracy; however, inadvertent computerized transcription errors may be present.    Estill Cotta 06/24/21 1743    Blanchie Dessert, MD 06/27/21 2125

## 2021-06-24 NOTE — ED Notes (Signed)
Pt transported to Xray via stretcher at this time.  ?

## 2021-06-24 NOTE — ED Provider Triage Note (Signed)
Emergency Medicine Provider Triage Evaluation Note  Candace [name] , a 36 y.o. female  was evaluated in triage.  Pt complains of chest pain, shortness of breath onset this morning.  States initially it was intermittent and now has become constant.  Chest pain is central.  Denies diaphoresis, headache, but endorses nausea, vomiting.  Endorses history of WPW last took flecainide this morning at 5:30 AM.  Review of Systems  Positive: As above Negative: As above  Physical Exam  BP (!) 118/59    Pulse (!) 113    Temp 99.3 F (37.4 C) (Oral)    Resp (!) 23    SpO2 100%  Gen:   Awake, no distress   Resp:  Normal effort  MSK:   Moves extremities without difficulty  Other:    Medical Decision Making  Medically screening exam initiated at 1:28 PM.  Appropriate orders placed.  Candace [name] was informed that the remainder of the evaluation will be completed by another provider, this initial triage assessment does not replace that evaluation, and the importance of remaining in the ED until their evaluation is complete.     Marita Kansas, PA-C 06/24/21 1329

## 2021-06-24 NOTE — Discharge Instructions (Addendum)
You were seen in the emergency department today for chest pain.  As we discussed all of your lab work, imaging and EKG have all been reassuring today.  We looked at your electrolytes, signs of infection, demand on your heart.  All of these things all look normal.  I like you to continue to monitor how you are doing, and return to the emergency department for any new or worsening symptoms.

## 2021-06-24 NOTE — ED Notes (Signed)
Pt d/c home per MD order. Discharge summary reviewed, pt verbalizes understanding. Ambulatory off unit. No s/s of acute distress noted. Pt mother is discharge ride home.

## 2021-06-24 NOTE — ED Triage Notes (Signed)
Patient awoke this am with chest pain. Arrived hyperventilating and complaining of headache. States that she has WPW and has taken am meds. Attempted to help with hyperventilation with no relief

## 2021-06-25 ENCOUNTER — Encounter: Payer: Self-pay | Admitting: Internal Medicine

## 2021-07-09 ENCOUNTER — Other Ambulatory Visit: Payer: Self-pay | Admitting: Internal Medicine

## 2021-07-10 ENCOUNTER — Other Ambulatory Visit (HOSPITAL_COMMUNITY): Payer: Self-pay

## 2021-07-10 MED ORDER — ATORVASTATIN CALCIUM 40 MG PO TABS
40.0000 mg | ORAL_TABLET | Freq: Every day | ORAL | 0 refills | Status: DC
  Filled 2021-07-10: qty 90, 90d supply, fill #0

## 2021-07-18 NOTE — Progress Notes (Signed)
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  Flecainide was therefore increased to 100 mg twice daily.  Other history includes borderline diabetes, GERD, anemia, hyperlipidemia, chronic dyspnea, chronic chest pain.  Echocardiogram in 2022 revealed asymmetric left ventricular hypertrophy of the inferior lateral segment, LVEF of 50% to 55%.  She also has a chronic RBBB bundle branch block.  There is no evidence of infiltrative disease per cardiac MRI.  There was found to be an myocardial stripe in the basal septum but was felt to be nonspecific.  Last seen in the office by Dr. Rennis Golden on 09/20/2020, and by Dr. Sharrell Ku on 01/30/2021.    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  While at work on 06/24/2021, the patient began to have stabbing sharp chest pain, chest pressure, feeling as if she could not catch her breath, with associated diaphoresis and nausea.  She was given a baby aspirin by the staff, and sent to ED.  She was ruled out for ACS.  She has been having issues with positional dizziness, chest pressure, and shortness of breath on and off for several months.  Due to the recent ED visit she was asked to be seen today for follow-up.  She has been compliant with her medications but she has not taken a dose of metoprolol for any racing heart breakthrough because this causes hypotension.  She often's experiences positional dizziness.    Past Medical History    Past  Medical History:  Diagnosis Date   Allergy    Anemia    Anxiety    BV (bacterial vaginosis) 12/15/2012   Chest pain    Diabetes (HCC)    borderline DM per patient - denies DM   Eczema    GERD (gastroesophageal reflux disease)    Headache(784.0)    History of chlamydia    History of gonorrhea    Hyperlipidemia    Phreesia 09/03/2020   Irregular heartbeat    Left bundle branch block    Shortness of breath    Tachycardia    Vaginal itching 12/15/2012   Vaginal Pap smear, abnormal    f/u ok   WPW (Wolff-Parkinson-White syndrome)    Past Surgical History:  Procedure Laterality Date   24 HOUR PH STUDY N/A 07/25/2020   Procedure: 24 HOUR PH STUDY;  Surgeon: Shellia Cleverly, DO;  Location: [facility];  Service: Gastroenterology;  Laterality: N/A;   CERVICAL CONIZATION W/BX N/A 12/22/2018   Procedure: LASER ABLATION OF CERVIX;  Surgeon: Lazaro Arms, MD;  Location: [facility];  Service: Gynecology;  Laterality: N/A;   DILATION AND CURETTAGE OF UTERUS N/A 11/22/2012   Procedure: SUCTION DILATATION AND CURETTAGE;  Surgeon: Lazaro Arms, MD;  Location: [facility];  Service: Gynecology;  Laterality: N/A;   ESOPHAGEAL MANOMETRY N/A 07/25/2020   Procedure: ESOPHAGEAL MANOMETRY (EM);  Surgeon: Shellia Cleverly, DO;  Location: [facility];  Service: Gastroenterology;  Laterality: N/A;   SVT ABLATION  N/A 11/05/2020   Procedure: SVT ABLATION;  Surgeon: Marinus Maw, MD;  Location: [facility] INVASIVE CV LAB;  Service: Cardiovascular;  Laterality: N/A;    Allergies  Allergies  Allergen Reactions   Vicodin [Hydrocodone-Acetaminophen] Other (See Comments)    violent   Percocet [Oxycodone-Acetaminophen]     Violent behaviors   Latex Itching and Rash   Other     Grass     History of Present Illness    Mrs. Candace [name] is a 36 year old female patient we are following for ongoing assessment and management of tachycardia, diagnosed with WPW and is being followed by Dr. Gilman Schmidt,  electrophysiologist.  She had a reduced LVEF of 51% per cardiac MRI she did not have any infiltrative disease.  She continues on flecainide, as needed metoprolol 12.5 mg for breakthrough tachycardia, and 12.5 mg nightly.  Home Medications    Current Outpatient Medications  Medication Sig Dispense Refill   atorvastatin (LIPITOR) 40 MG tablet Take 1 tablet (40 mg total) by mouth daily. 90 tablet 0   flecainide (TAMBOCOR) 100 MG tablet Take 1 tablet (100 mg total) by mouth 2 (two) times daily. 180 tablet 3   levonorgestrel (MIRENA) 20 MCG/24HR IUD 1 each by Intrauterine route once.     Vitamin D, Ergocalciferol, (DRISDOL) 1.25 MG (50000 UNIT) CAPS capsule Take 1 capsule (50,000 Units total) by mouth once a week. 4 capsule 0   No current facility-administered medications for this visit.     Family History    Family History  Problem Relation Age of Onset   Prostate cancer Paternal Grandfather    Breast cancer Paternal Grandmother    Lung cancer Paternal Grandmother    Lung cancer Maternal Grandfather    Colon polyps Father    Prostate cancer Father    Healthy Brother    Healthy Daughter    Healthy Son    Breast cancer Maternal Aunt        Multiple with Breast Cancer   Cervical cancer Maternal Aunt    Stroke Maternal Aunt    Hypertension Other    Diabetes Other    Other Other        stomach tumors   Heart disease Other        her mom, gma and aunts have arrythmia   Kidney disease Paternal Aunt    Colon cancer Paternal Aunt        colon problems   Stroke Paternal Uncle        PGreatUncle   Prostate cancer Paternal Uncle    Breast cancer Cousin    Esophageal cancer Neg Hx    Rectal cancer Neg Hx    Stomach cancer Neg Hx    She indicated that her mother is alive. She indicated that her father is alive. She indicated that her brother is alive. She indicated that her maternal grandmother is alive. She indicated that her maternal grandfather is deceased. She indicated that her  paternal grandmother is deceased. She indicated that her paternal grandfather is deceased. She indicated that her daughter is alive. She indicated that her son is alive. She indicated that the status of her maternal aunt is unknown. She indicated that the status of her paternal aunt is unknown. She indicated that the status of her paternal uncle is unknown. She indicated that the status of her cousin is unknown. She indicated that the status of her neg hx is unknown. She indicated that the status of her other is unknown.  Social History  Social History   Socioeconomic History   Marital status: Single    Spouse name: Not on file   Number of children: 2   Years of education: Not on file   Highest education level: Some college, no degree  Occupational History   Occupation: CNA    Comment: [employer]  Tobacco Use   Smoking status: Never   Smokeless tobacco: Never  Vaping Use   Vaping Use: Never used  Substance and Sexual Activity   Alcohol use: Not Currently    Comment: socially    Drug use: No   Sexual activity: Yes    Birth control/protection: I.U.D.  Other Topics Concern   Not on file  Social History Narrative   Not on file   Social Determinants of Health   Financial Resource Strain: Medium Risk   Difficulty of Paying Living Expenses: Somewhat hard  Food Insecurity: No Food Insecurity   Worried About Running Out of Food in the Last Year: Never true   Ran Out of Food in the Last Year: Never true  Transportation Needs: No Transportation Needs   Lack of Transportation (Medical): No   Lack of Transportation (Non-Medical): No  Physical Activity: Insufficiently Active   Days of Exercise per Week: 2 days   Minutes of Exercise per Session: 30 min  Stress: No Stress Concern Present   Feeling of Stress : Only a little  Social Connections: Moderately Integrated   Frequency of Communication with Friends and Family: More than three times a week   Frequency of Social  Gatherings with Friends and Family: More than three times a week   Attends Religious Services: More than 4 times per year   Active Member of [employer] or Organizations: Yes   Attends Engineer, structural: More than 4 times per year   Marital Status: Never married  Catering manager Violence: Not At Risk   Fear of Current or Ex-Partner: No   Emotionally Abused: No   Physically Abused: No   Sexually Abused: No     Review of Systems    General:  No chills, fever, night sweats or weight changes.  Cardiovascular:  Positive for chest pressure  Dayna order some Protonix 20 mg daily okay yeah just give her 32 refills, dyspnea on exertion, edema, orthopnea, palpitations, paroxysmal nocturnal dyspnea. Dermatological: No rash, lesions/masses Respiratory: No cough, + for dyspnea and chest pressure with and without exertion.  Urologic: No hematuria, dysuria Abdominal:   Positive for episodes of nausea, vomiting, no diarrhea, bright red blood per rectum, melena, or hematemesis Neurologic:  No visual changes, wkns, changes in mental status.She has some tremors at time.  All other systems reviewed and are otherwise negative except as noted above.     Physical Exam    VS:  BP 110/72 (BP Location: Left Arm, Patient Position: Sitting, Cuff Size: Large)    Pulse 74    Ht  (1.575 m)    Wt 196 lb 12.8 oz (89.3 kg)    LMP 06/19/2021 (Exact Date)    BMI 36.00 kg/m  , BMI Body mass index is 36 kg/m.     GEN: Well nourished, well developed, in no acute distress. HEENT: normal. Neck: Supple, no JVD, carotid bruits, or masses. Cardiac: RRR, no murmurs, rubs, or gallops. No clubbing, cyanosis, edema.  Radials/DP/PT 2+ and equal bilaterally.  Respiratory:  Respirations regular and unlabored, clear to auscultation bilaterally. GI: Soft, nontender, nondistended, BS + x 4. MS: no deformity or atrophy. Skin:  warm and dry, no rash. Neuro:  Strength and sensation are intact. Psych: Normal  affect.  Accessory Clinical Findings    ECG personally reviewed by me today- NSR with RBBB HR 74 bpm - No acute changes  Lab Results  Component Value Date   WBC 5.7 06/24/2021   HGB 12.8 06/24/2021   HCT 40.6 06/24/2021   MCV 91.6 06/24/2021   PLT 242 06/24/2021   Lab Results  Component Value Date   CREATININE 1.05 (H) 06/24/2021   BUN 13 06/24/2021   NA 135 06/24/2021   K 3.8 06/24/2021   CL 103 06/24/2021   CO2 23 06/24/2021   Lab Results  Component Value Date   ALT 15 06/13/2020   AST 15 06/13/2020   ALKPHOS 65 06/13/2020   BILITOT 1.0 06/13/2020   Lab Results  Component Value Date   CHOL 154 09/20/2020   HDL 68 09/20/2020   LDLCALC 78 09/20/2020   LDLDIRECT 112 (H) 09/10/2011   TRIG 32 09/20/2020   CHOLHDL 2.3 09/20/2020    Lab Results  Component Value Date   HGBA1C 5.6 06/13/2020    Review of Prior Studies: Exercise stress test: 01/15/2021 (s/p Flecainide).   No ST deviation was noted.   Fair exercise capacity, achieved 7.7 METS Did not reach target HR, max HR 148 bpm, which is 80% of max age predicted HR Normal blood pressure response to exercise No EKG evidence of ischemia, but nondiagnostic study due to failure to meet target HR No stress induced arrhythmia or QRS prolongation noted   Cardiac MRI 09/14/2020 IMPRESSION: 1. Low normal left ventricular systolic function. LVEF 51%. Upper limit of normal left ventricular indexed volume.   2. Left ventricular basal septal thinning, 5 mm. Abnormal septal motion due to conduction delay. Mild hypokinesis of basal inferoseptum.   3. Normal right ventricular chamber size and systolic function. RVEF 53%.   4. There is a mild mid-myocardial stripe of post-contrast delayed enhancement in the basal septum. This is a nonspecific finding. No findings to suggest scar, fibrosis, infarction, inflammatory or infiltrative process.   Assessment & Plan   1.  Recurrent chest pain: The patient has squeezing chest  discomfort with shortness of breath and trouble breathing feeling as if there is pressure in her chest.  This occurs with and without exertion.  This also can occur while she is laying in the bed and it has awakened her.  She was seen in the ED on 06/24/2021 and ruled out for ACS.  May need to consider nuclear medicine stress test although exercise stress test was negative for evidence of ischemia status post flecainide implementation.  2.  Rule out GERD: We will start her on low-dose Protonix 20 mg daily.  I have explained to her that it may take about a week before she notices any change.  She is to report any intolerance of this medicine.  3.  WPW: Status post ablation with AP conduction.  Followed by Dr. Ladona Ridgelaylor.  Remains on flecainide 100 mg twice daily.  Unable to take extra dose of metoprolol 12.5 mg with heart racing as this causes significant hypotension.  She has been complaining of worsening rapid heart rhythm despite flecainide.  This is associated with some dizziness and weakness.  I will have her see Dr. Ladona Ridgelaylor at first available for him to reevaluate her and plan treatment regimen and medication adjustments at his discretion.  Last TSH was checked at 08/10/2020, at 0.96.  4.  Positional dizziness: I did  check orthostatic blood pressures in the office today and they were negative.  Will not add any additional medications.    Current medicines are reviewed at length with the patient today.  I have spent 25 min's  dedicated to the care of this patient on the date of this encounter to include pre-visit review of records, assessment, management and diagnostic testing,with shared decision making.   Signed, Bettey Mare. Liborio Nixon, ANP, AACC   07/19/2021 9:07 AM    [facility] [address] Office [phone] Fax [phone]  Notice: This dictation was prepared with Dragon dictation along with smaller phrase technology. Any transcriptional errors that  result from this process are unintentional and may not be corrected upon review.

## 2021-07-19 ENCOUNTER — Other Ambulatory Visit (HOSPITAL_COMMUNITY): Payer: Self-pay

## 2021-07-19 ENCOUNTER — Encounter: Payer: Self-pay | Admitting: Adult Health

## 2021-07-19 ENCOUNTER — Ambulatory Visit (INDEPENDENT_AMBULATORY_CARE_PROVIDER_SITE_OTHER): Payer: No Typology Code available for payment source | Admitting: Adult Health

## 2021-07-19 ENCOUNTER — Other Ambulatory Visit: Payer: Self-pay

## 2021-07-19 VITALS — BP 110/72 | HR 74 | Ht 62.0 in | Wt 196.8 lb

## 2021-07-19 DIAGNOSIS — R079 Chest pain, unspecified: Secondary | ICD-10-CM

## 2021-07-19 DIAGNOSIS — I456 Pre-excitation syndrome: Secondary | ICD-10-CM

## 2021-07-19 DIAGNOSIS — R42 Dizziness and giddiness: Secondary | ICD-10-CM

## 2021-07-19 DIAGNOSIS — R002 Palpitations: Secondary | ICD-10-CM

## 2021-07-19 DIAGNOSIS — E7849 Other hyperlipidemia: Secondary | ICD-10-CM

## 2021-07-19 MED ORDER — PANTOPRAZOLE SODIUM 20 MG PO TBEC
20.0000 mg | DELAYED_RELEASE_TABLET | Freq: Every day | ORAL | 2 refills | Status: DC
Start: 2021-07-19 — End: 2021-08-30
  Filled 2021-07-19 – 2021-08-02 (×2): qty 30, 30d supply, fill #0

## 2021-07-19 NOTE — Patient Instructions (Signed)
Medication Instructions:  ?Start Protonix 20 mg ( 1 Tablet Daily) ?*If you need a refill on your cardiac medications before your next appointment, please call your pharmacy* ? ? ?Lab Work: ?None  ?If you have labs (blood work) drawn today and your tests are completely normal, you will receive your results only by: ?MyChart Message (if you have MyChart) OR ?A paper copy in the mail ?If you have any lab test that is abnormal or we need to change your treatment, we will call you to review the results. ? ? ?Testing/Procedures: ?none ? ? ?Follow-Up: ?At [facility], you and your health needs are our priority.  As part of our continuing mission to provide you with exceptional heart care, we have created designated Provider Care Teams.  These Care Teams include your primary Cardiologist (physician) and Advanced Practice Providers (APPs -  Physician Assistants and Nurse Practitioners) who all work together to provide you with the care you need, when you need it. ? ?We recommend signing up for the patient portal called "MyChart".  Sign up information is provided on this After Visit Summary.  MyChart is used to connect with patients for Virtual Visits (Telemedicine).  Patients are able to view lab/test results, encounter notes, upcoming appointments, etc.  Non-urgent messages can be sent to your provider as well.   ?To learn more about what you can do with MyChart, go to [email or web address].   ? ?Your next appointment:   ?First Available ? ?The format for your next appointment:   ?In Person ? ?Provider:   ?Lewayne Bunting, MD  ? ? ?  ?

## 2021-07-29 ENCOUNTER — Other Ambulatory Visit (HOSPITAL_COMMUNITY): Payer: Self-pay

## 2021-08-02 ENCOUNTER — Other Ambulatory Visit (HOSPITAL_COMMUNITY): Payer: Self-pay

## 2021-08-30 ENCOUNTER — Encounter: Payer: Self-pay | Admitting: Internal Medicine

## 2021-08-30 ENCOUNTER — Ambulatory Visit (INDEPENDENT_AMBULATORY_CARE_PROVIDER_SITE_OTHER): Payer: No Typology Code available for payment source | Admitting: Internal Medicine

## 2021-08-30 VITALS — BP 110/76 | HR 89 | Ht 62.0 in | Wt 204.0 lb

## 2021-08-30 DIAGNOSIS — I456 Pre-excitation syndrome: Secondary | ICD-10-CM | POA: Diagnosis not present

## 2021-08-30 NOTE — Progress Notes (Signed)
? ? ? ? ?HPI ?[name] returns today for followup. 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 Because of her young age, we decided to abandon additional ablation. She has been controlled on 100 mg bid of flecainide. She was in the ED with chest pain and sinus tachy. Her AP conduction has resolved on flecainide. ?Allergies  ?Allergen Reactions  ? Vicodin [Hydrocodone-Acetaminophen] Other (See Comments)  ?  violent  ? Percocet [Oxycodone-Acetaminophen]   ?  Violent behaviors  ? Latex Itching and Rash  ? Other   ?  Grass ?  ? ? ? ?Current Outpatient Medications  ?Medication Sig Dispense Refill  ? atorvastatin (LIPITOR) 40 MG tablet Take 1 tablet (40 mg total) by mouth daily. 90 tablet 0  ? flecainide (TAMBOCOR) 100 MG tablet Take 1 tablet (100 mg total) by mouth 2 (two) times daily. 180 tablet 3  ? levonorgestrel (MIRENA) 20 MCG/24HR IUD 1 each by Intrauterine route once.    ? Vitamin D, Ergocalciferol, (DRISDOL) 1.25 MG (50000 UNIT) CAPS capsule Take 1 capsule (50,000 Units total) by mouth once a week. 4 capsule 0  ? ?No current facility-administered medications for this visit.  ? ? ? ?Past Medical History:  ?Diagnosis Date  ? Allergy   ? Anemia   ? Anxiety   ? BV (bacterial vaginosis) 12/15/2012  ? Chest pain   ? Diabetes (HCC)   ? borderline DM per patient - denies DM  ? Eczema   ? GERD (gastroesophageal reflux disease)   ? Headache(784.0)   ? History of chlamydia   ? History of gonorrhea   ? Hyperlipidemia   ? Phreesia 09/03/2020  ? Irregular heartbeat   ? Left bundle branch block   ? Shortness of breath   ? Tachycardia   ? Vaginal itching 12/15/2012  ? Vaginal Pap smear, abnormal   ? f/u ok  ? WPW (Wolff-Parkinson-White syndrome)   ? ? ?ROS: ? ? All systems reviewed and negative except as noted in the HPI. ? ? ?Past Surgical History:  ?Procedure Laterality Date  ? 3424 HOUR PH STUDY N/A 07/25/2020  ? Procedure: 24 HOUR PH STUDY;  Surgeon:  Shellia Cleverlyirigliano, Vito V, DO;  [facility];  Service: Gastroenterology;  Laterality: N/A;  ? CERVICAL CONIZATION W/BX N/A 12/22/2018  ? Procedure: LASER ABLATION OF CERVIX;  Surgeon: Lazaro ArmsEure, Luther H, MD;  Location: [facility];  Service: Gynecology;  Laterality: N/A;  ? DILATION AND CURETTAGE OF UTERUS N/A 11/22/2012  ? Procedure: SUCTION DILATATION AND CURETTAGE;  Surgeon: Lazaro ArmsLuther H Eure, MD;  Location: [facility];  Service: Gynecology;  Laterality: N/A;  ? ESOPHAGEAL MANOMETRY N/A 07/25/2020  ? Procedure: ESOPHAGEAL MANOMETRY (EM);  Surgeon: Shellia Cleverlyirigliano, Vito V, DO;  [facility];  Service: Gastroenterology;  Laterality: N/A;  ? SVT ABLATION N/A 11/05/2020  ? Procedure: SVT ABLATION;  Surgeon: Marinus Mawaylor, Kurt Azimi W, MD;  [facility];  Service: Cardiovascular;  Laterality: N/A;  ? ? ? ?Family History  ?Problem Relation Age of Onset  ? Prostate cancer Paternal Grandfather   ? Breast cancer Paternal Grandmother   ? Lung cancer Paternal Grandmother   ? Lung cancer Maternal Grandfather   ? Colon polyps Father   ? Prostate cancer Father   ? Healthy Brother   ? Healthy Daughter   ? Healthy Son   ? Breast cancer Maternal Aunt   ?     Multiple  with Breast Cancer  ? Cervical cancer Maternal Aunt   ? Stroke Maternal Aunt   ? Hypertension Other   ? Diabetes Other   ? Other Other   ?     stomach tumors  ? Heart disease Other   ?     her mom, gma and aunts have arrythmia  ? Kidney disease Paternal Aunt   ? Colon cancer Paternal Aunt   ?     colon problems  ? Stroke Paternal Uncle   ?     PGreatUncle  ? Prostate cancer Paternal Uncle   ? Breast cancer Cousin   ? Esophageal cancer Neg Hx   ? Rectal cancer Neg Hx   ? Stomach cancer Neg Hx   ? ? ? ?Social History  ? ?Socioeconomic History  ? Marital status: Single  ?  Spouse name: Not on file  ? Number of children: 2  ? Years of education: Not on file  ? Highest education level: Some college, no degree  ?Occupational History  ? Occupation: CNA  ?  Comment: [employer]  ?Tobacco Use  ? Smoking status: Never  ? Smokeless tobacco: Never  ?Vaping Use  ? Vaping Use: Never used  ?Substance and Sexual Activity  ? Alcohol use: Not Currently  ?  Comment: socially   ? Drug use: No  ? Sexual activity: Yes  ?  Birth control/protection: I.U.D.  ?Other Topics Concern  ? Not on file  ?Social History Narrative  ? Not on file  ? ?Social Determinants of Health  ? ?Financial Resource Strain: Medium Risk  ? Difficulty of Paying Living Expenses: Somewhat hard  ?Food Insecurity: No Food Insecurity  ? Worried About Programme researcher, broadcasting/film/video in the Last Year: Never true  ? Ran Out of Food in the Last Year: Never true  ?Transportation Needs: No Transportation Needs  ? Lack of Transportation (Medical): No  ? Lack of Transportation (Non-Medical): No  ?Physical Activity: Insufficiently Active  ? Days of Exercise per Week: 2 days  ? Minutes of Exercise per Session: 30 min  ?Stress: No Stress Concern Present  ? Feeling of Stress : Only a little  ?Social Connections: Moderately Integrated  ? Frequency of Communication with Friends and Family: More than three times a week  ? Frequency of Social Gatherings with Friends and Family: More than three times a week  ? Attends Religious Services: More than 4 times per year  ? Active Member of Clubs or Organizations: Yes  ? Attends Banker Meetings: More than 4 times per year  ? Marital Status: Never married  ?Intimate Partner Violence: Not At Risk  ? Fear of Current or Ex-Partner: No  ? Emotionally Abused: No  ? Physically Abused: No  ? Sexually Abused: No  ? ? ? ?BP 110/76   Pulse 89   Ht 5\' 2"  (1.575 m)   Wt 204 lb (92.5 kg)   SpO2 96%   BMI 37.31 kg/m?  ? ?Physical Exam: ? ?Well appearing NAD ?HEENT: Unremarkable ?Neck:  No JVD, no thyromegally ?Lymphatics:  No adenopathy ?Back:  No CVA tenderness ?Lungs:  Clear ?HEART:  Regular rate rhythm, no murmurs, no rubs, no clicks ?Abd:  soft, positive bowel sounds, no organomegally, no rebound, no  guarding ?Ext:  2 plus pulses, no edema, no cyanosis, no clubbing ?Skin:  No rashes no nodules ?Neuro:  CN II through XII intact, motor grossly intact ? ?EKG - reviewed. NSR with RBBB. No pre-excitation ? ?  Assess/Plan:  ?WPW syndrome - she is s/p ablation with recurrence due to our inability to ablate out of concern for CHF. She is doing well on flecainide.  ?Obesity - she continues to lose weight.  ?Candace Gowda Donnovan Stamour,MD ?

## 2021-08-30 NOTE — Patient Instructions (Signed)
Medication Instructions:  ?Your physician recommends that you continue on your current medications as directed. Please refer to the Current Medication list given to you today. ? ?Labwork: ?None ordered. ? ?Testing/Procedures: ?None ordered. ? ?Follow-Up: ? ?Your physician wants you to follow-up in: one year with Gregg Taylor, MD or one of the following Advanced Practice Providers on your designated Care Team:   ?Renee Ursuy, PA-C ?Michael "Andy" Tillery, PA-C ? ? ?Any Other Special Instructions Will Be Listed Below (If Applicable). ? ?If you need a refill on your cardiac medications before your next appointment, please call your pharmacy.  ? ?Important Information About Sugar ? ? ? ? ? ? ? ?

## 2021-09-04 ENCOUNTER — Other Ambulatory Visit (HOSPITAL_COMMUNITY): Payer: Self-pay

## 2021-10-20 ENCOUNTER — Other Ambulatory Visit: Payer: Self-pay | Admitting: Internal Medicine

## 2021-10-21 ENCOUNTER — Other Ambulatory Visit (HOSPITAL_COMMUNITY): Payer: Self-pay

## 2021-10-21 MED ORDER — ATORVASTATIN CALCIUM 40 MG PO TABS
40.0000 mg | ORAL_TABLET | Freq: Every day | ORAL | 0 refills | Status: DC
  Filled 2021-10-21: qty 90, 90d supply, fill #0

## 2021-11-06 IMAGING — RF DG ESOPHAGUS
12 series · 14 of 14 positions shown · non-contrast
Comparison: CT chest 10/14/2019

CLINICAL DATA: Post prandial vomiting. Early satiety. Normal
gastric emptying

EXAM:
ESOPHOGRAM / BARIUM SWALLOW / BARIUM TABLET STUDY
TECHNIQUE: Combined double contrast and single contrast examination performed
using effervescent crystals, thick barium liquid, and thin barium
liquid. The patient was observed with fluoroscopy swallowing a 13 mm
barium sulphate tablet.
FLUOROSCOPY TIME:  Fluoroscopy Time:  2 minutes 36 seconds
Radiation Exposure Index (if provided by the fluoroscopic device):
40.5 mGy
Number of Acquired Spot Images: 11

[Series 1: fluoro_barium 2fps_bw · 0.17mm/px · 1 of 1 slices shown (1 of 11)]
[im 1/1]
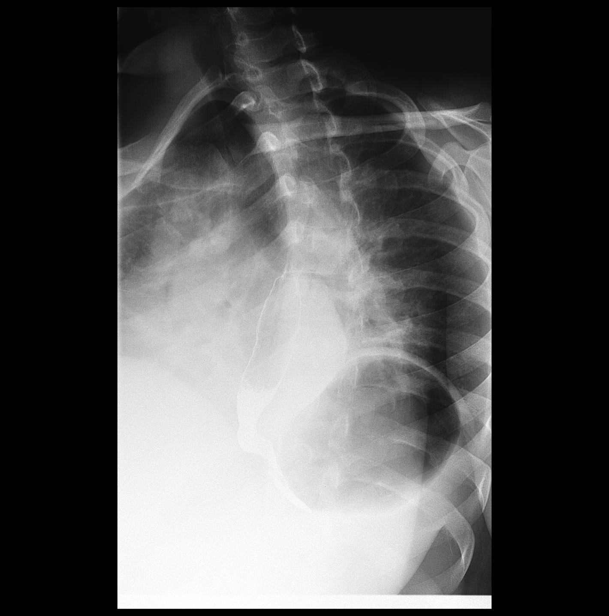

[Series 2: fluoro_barium 2fps_bw · 0.17mm/px · 1 of 1 slices shown (2 of 11)]
[im 1/1]
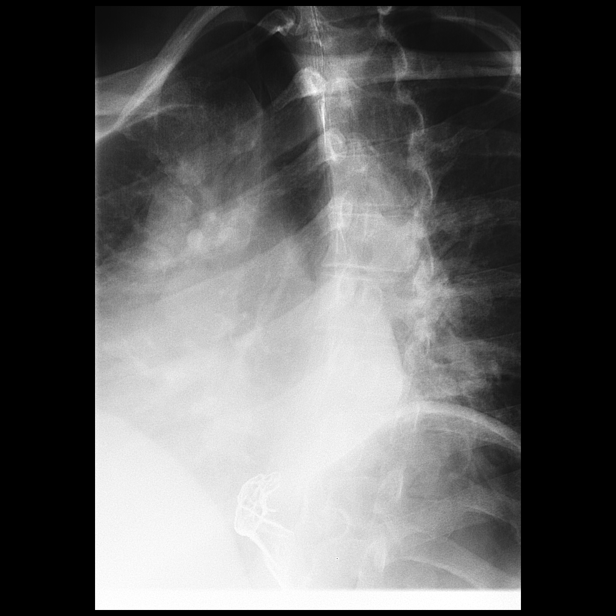

[Series 3: cp_standard · 0.34mm/px · 3 of 17 frames shown]
[frame 3/17]
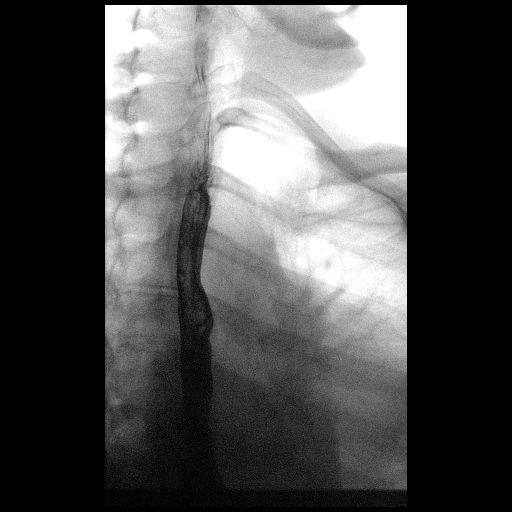
[frame 9/17]
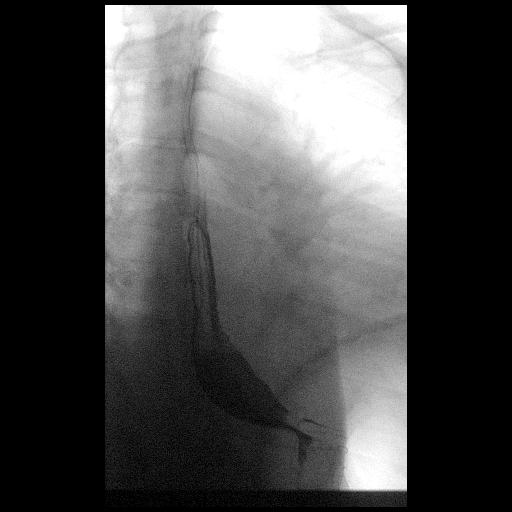
[frame 15/17]
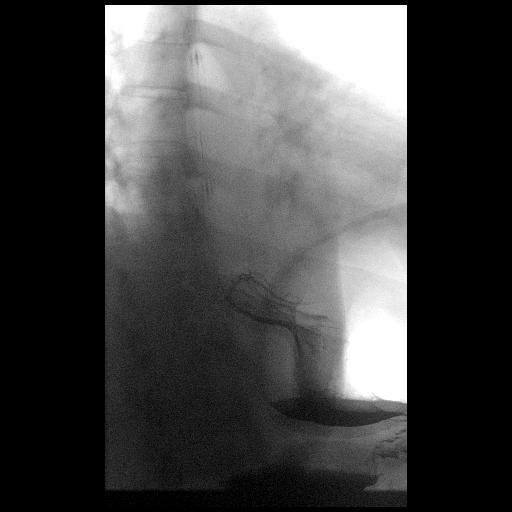

[Series 4: fluoro_barium 2fps_bw · 0.17mm/px · 1 of 1 slices shown (3 of 11)]
[im 1/1]
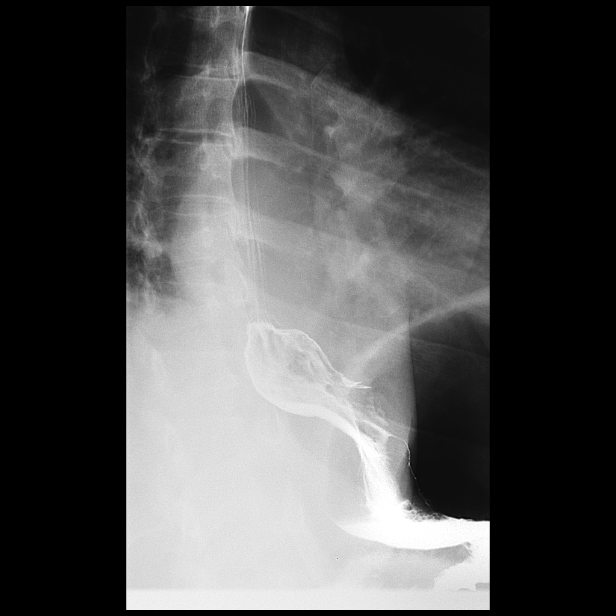

[Series 5: fluoro_barium 2fps_bw · 0.17mm/px · 1 of 1 slices shown (4 of 11)]
[im 1/1]
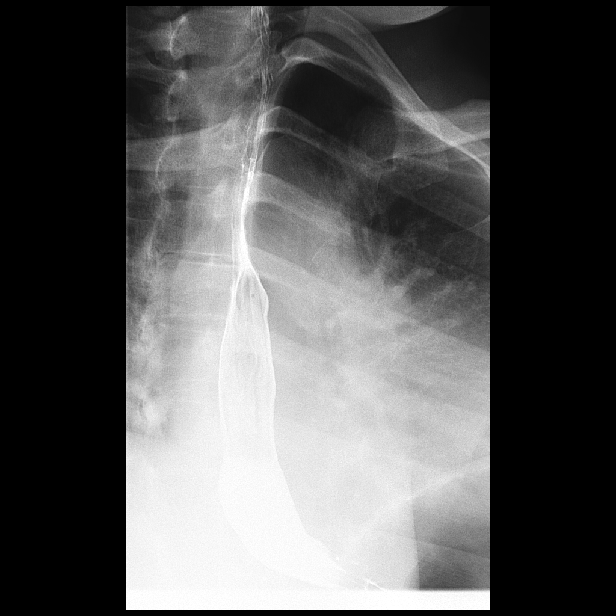

[Series 6: fluoro_barium 2fps_bw · 0.18mm/px · 1 of 1 slices shown (5 of 11)]
[im 1/1]
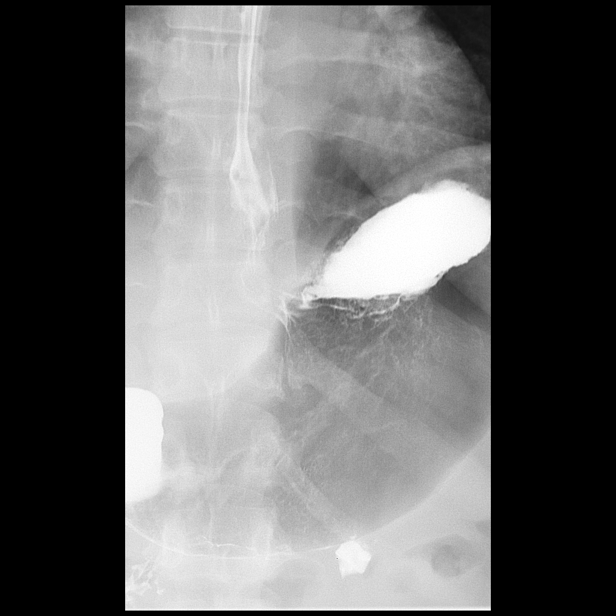

[Series 7: fluoro_barium 2fps_bw · 0.18mm/px · 1 of 1 slices shown (6 of 11)]
[im 1/1]
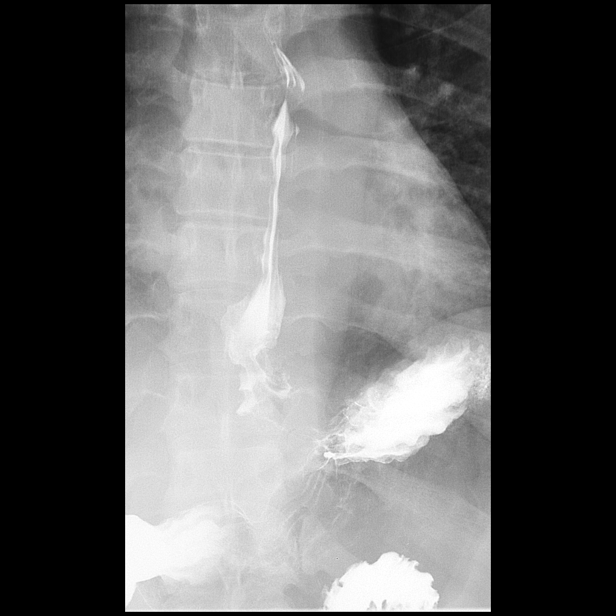

[Series 8: fluoro_barium 2fps_bw · 0.18mm/px · 1 of 1 slices shown (7 of 11)]
[im 1/1]
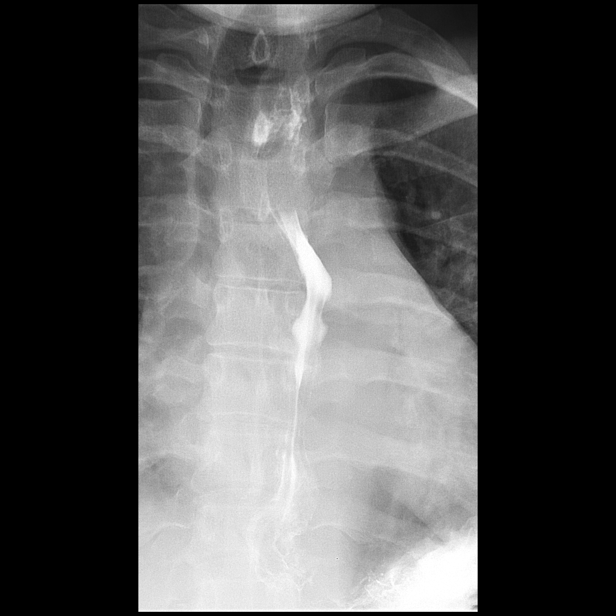

[Series 9: fluoro_barium 2fps_bw · 0.18mm/px · 1 of 1 slices shown (8 of 11)]
[im 1/1]
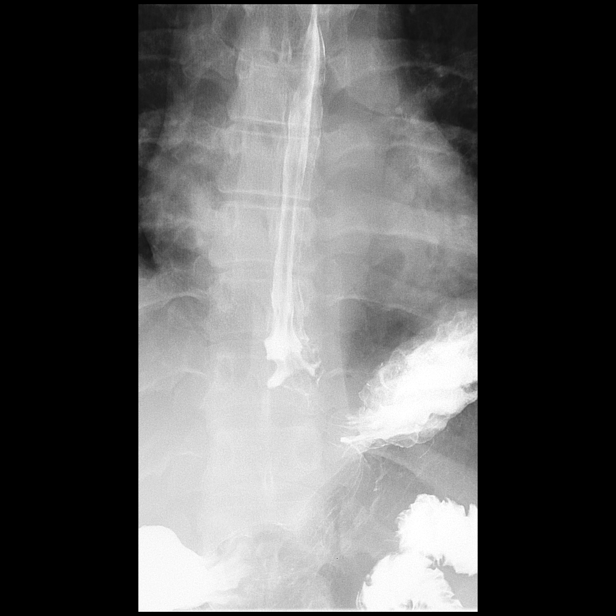

[Series 10: fluoro_barium 2fps_bw · 0.17mm/px · 1 of 1 slices shown (9 of 11)]
[im 1/1]
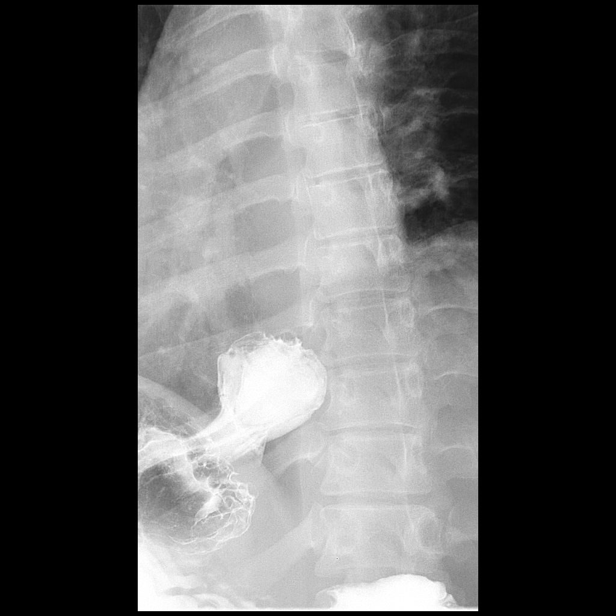

[Series 11: fluoro_barium 2fps_bw · 0.17mm/px · 1 of 1 slices shown (10 of 11)]
[im 1/1]
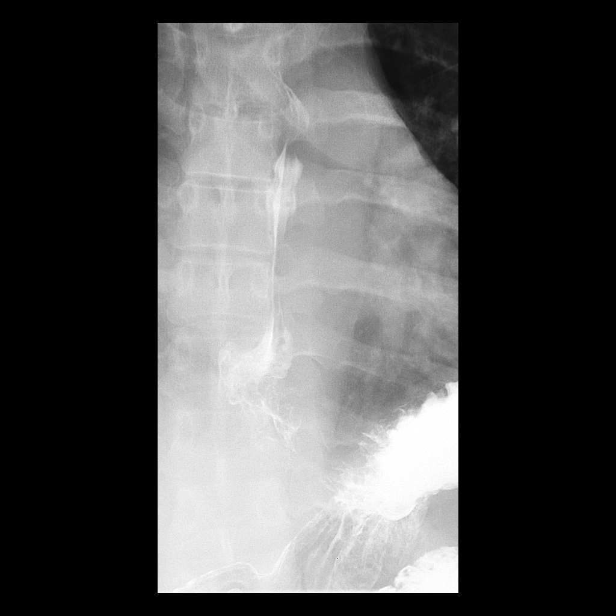

[Series 12: fluoro_barium 2fps_bw · 0.17mm/px · 1 of 1 slices shown (11 of 11)]
[im 1/1]
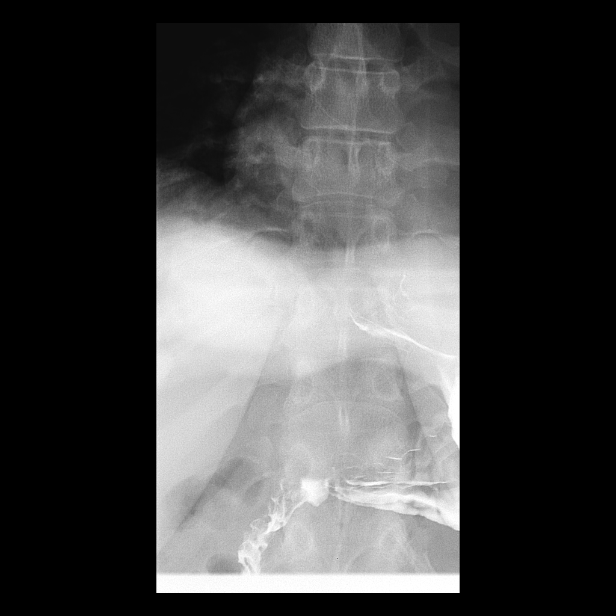

[14 of 14 positions shown; findings below may reference images not displayed]

FINDINGS: No swallowing dysfunction in the hypopharynx or high cervical
esophagus. No mucosal irregularity within the thoracic esophagus or
distal esophagus. Contrast flowed easily through the GE junction.

When patient transitioned from erect to supine positioning, vomiting
was elicited.

And additional small volume of contrast was administered in semi
prone RAO positioning and normal esophageal motility was
demonstrated.

Continued gastroesophageal reflux was demonstrated in supine
position.

Moderate hiatal hernia present.

13 mm tablet passed GE junction easily.
IMPRESSION: 1. Moderate gastroesophageal reflux.
2. Vomiting response elicited when transitioning from erect to
supine positioning.
3. Small to moderate size hiatal hernia.
4. No esophageal mucosal abnormality identified.

## 2021-12-01 ENCOUNTER — Other Ambulatory Visit: Payer: Self-pay | Admitting: Internal Medicine

## 2021-12-02 ENCOUNTER — Other Ambulatory Visit (HOSPITAL_COMMUNITY): Payer: Self-pay

## 2021-12-02 MED ORDER — FLECAINIDE ACETATE 100 MG PO TABS
100.0000 mg | ORAL_TABLET | Freq: Two times a day (BID) | ORAL | 2 refills | Status: DC
  Filled 2021-12-02: qty 180, 90d supply, fill #0
  Filled 2022-02-27: qty 180, 90d supply, fill #1
  Filled 2022-06-09: qty 180, 90d supply, fill #2

## 2021-12-02 MED ORDER — ATORVASTATIN CALCIUM 40 MG PO TABS
40.0000 mg | ORAL_TABLET | Freq: Every day | ORAL | 0 refills | Status: DC
  Filled 2021-12-02 – 2022-01-26 (×2): qty 90, 90d supply, fill #0

## 2021-12-04 ENCOUNTER — Other Ambulatory Visit (HOSPITAL_COMMUNITY): Payer: Self-pay

## 2021-12-18 ENCOUNTER — Encounter (INDEPENDENT_AMBULATORY_CARE_PROVIDER_SITE_OTHER): Payer: Self-pay

## 2022-01-06 NOTE — Progress Notes (Deleted)
Patient ID: Candace [name], female    DOB: 10-15-1985  MRN: [id number]  CC: Annual Physical Exam  Subjective: Candace [name] is a 36 y.o. female who presents for annual physical exam.   Her concerns today include: ***  Patient Active Problem List   Diagnosis Date Noted   Missed periods 04/02/2021   WPW (Wolff-Parkinson-White syndrome) 11/30/2020   Palpitations 10/02/2020   Gastroesophageal reflux disease    Cough    Regurgitation and rechewing    History of abnormal cervical Pap smear 06/13/2019   Encounter for gynecological examination with Papanicolaou smear of cervix 06/13/2019   IUD (intrauterine device) in place 06/13/2019   Encounter for insertion of mirena IUD 01/28/2019   Prediabetes    Abnormal Pap smear of cervix 11/01/2013   Hyperlipidemia 10/17/2011   Iron deficiency anemia 09/10/2011   Eczema 09/10/2011   Seasonal allergies 09/10/2011   Obesity 09/10/2011     Current Outpatient Medications on File Prior to Visit  Medication Sig Dispense Refill   atorvastatin (LIPITOR) 40 MG tablet Take 1 tablet (40 mg total) by mouth daily. 90 tablet 0   flecainide (TAMBOCOR) 100 MG tablet Take 1 tablet (100 mg total) by mouth 2 (two) times daily. 180 tablet 2   levonorgestrel (MIRENA) 20 MCG/24HR IUD 1 each by Intrauterine route once.     Vitamin D, Ergocalciferol, (DRISDOL) 1.25 MG (50000 UNIT) CAPS capsule Take 1 capsule (50,000 Units total) by mouth once a week. 4 capsule 0   No current facility-administered medications on file prior to visit.    Allergies  Allergen Reactions   Vicodin [Hydrocodone-Acetaminophen] Other (See Comments)    violent   Percocet [Oxycodone-Acetaminophen]     Violent behaviors   Latex Itching and Rash   Other     Grass     Social History   Socioeconomic History   Marital status: Single    Spouse name: Not on file   Number of children: 2   Years of education: Not on file   Highest education level: Some college, no degree   Occupational History   Occupation: CNA    Comment: [employer]  Tobacco Use   Smoking status: Never   Smokeless tobacco: Never  Vaping Use   Vaping Use: Never used  Substance and Sexual Activity   Alcohol use: Not Currently    Comment: socially    Drug use: No   Sexual activity: Yes    Birth control/protection: I.U.D.  Other Topics Concern   Not on file  Social History Narrative   Not on file   Social Determinants of Health   Financial Resource Strain: Medium Risk (04/22/2021)   Overall Financial Resource Strain (CARDIA)    Difficulty of Paying Living Expenses: Somewhat hard  Food Insecurity: No Food Insecurity (04/22/2021)   Hunger Vital Sign    Worried About Running Out of Food in the Last Year: Never true    Ran Out of Food in the Last Year: Never true  Transportation Needs: No Transportation Needs (04/22/2021)   PRAPARE - Administrator, Civil Service (Medical): No    Lack of Transportation (Non-Medical): No  Physical Activity: Insufficiently Active (04/22/2021)   Exercise Vital Sign    Days of Exercise per Week: 2 days    Minutes of Exercise per Session: 30 min  Stress: No Stress Concern Present (04/22/2021)   [employer] of Occupational Health - Occupational Stress Questionnaire    Feeling of Stress : Only a  little  Social Connections: Moderately Integrated (04/22/2021)   Social Connection and Isolation Panel [NHANES]    Frequency of Communication with Friends and Family: More than three times a week    Frequency of Social Gatherings with Friends and Family: More than three times a week    Attends Religious Services: More than 4 times per year    Active Member of Clubs or Organizations: Yes    Attends Banker Meetings: More than 4 times per year    Marital Status: Never married  Intimate Partner Violence: Not At Risk (04/22/2021)   Humiliation, Afraid, Rape, and Kick questionnaire    Fear of Current or Ex-Partner: No     Emotionally Abused: No    Physically Abused: No    Sexually Abused: No    Family History  Problem Relation Age of Onset   Prostate cancer Paternal Grandfather    Breast cancer Paternal Grandmother    Lung cancer Paternal Grandmother    Lung cancer Maternal Grandfather    Colon polyps Father    Prostate cancer Father    Healthy Brother    Healthy Daughter    Healthy Son    Breast cancer Maternal Aunt        Multiple with Breast Cancer   Cervical cancer Maternal Aunt    Stroke Maternal Aunt    Hypertension Other    Diabetes Other    Other Other        stomach tumors   Heart disease Other        her mom, gma and aunts have arrythmia   Kidney disease Paternal Aunt    Colon cancer Paternal Aunt        colon problems   Stroke Paternal Uncle        PGreatUncle   Prostate cancer Paternal Uncle    Breast cancer Cousin    Esophageal cancer Neg Hx    Rectal cancer Neg Hx    Stomach cancer Neg Hx     Past Surgical History:  Procedure Laterality Date   82 HOUR PH STUDY N/A 07/25/2020   Procedure: 24 HOUR PH STUDY;  Surgeon: Shellia Cleverly, DO;  Location: [facility];  Service: Gastroenterology;  Laterality: N/A;   CERVICAL CONIZATION W/BX N/A 12/22/2018   Procedure: LASER ABLATION OF CERVIX;  Surgeon: Lazaro Arms, MD;  Location: [facility];  Service: Gynecology;  Laterality: N/A;   DILATION AND CURETTAGE OF UTERUS N/A 11/22/2012   Procedure: SUCTION DILATATION AND CURETTAGE;  Surgeon: Lazaro Arms, MD;  Location: [facility];  Service: Gynecology;  Laterality: N/A;   ESOPHAGEAL MANOMETRY N/A 07/25/2020   Procedure: ESOPHAGEAL MANOMETRY (EM);  Surgeon: Shellia Cleverly, DO;  Location: [facility];  Service: Gastroenterology;  Laterality: N/A;   SVT ABLATION N/A 11/05/2020   Procedure: SVT ABLATION;  Surgeon: Marinus Maw, MD;  Location: [facility] INVASIVE CV LAB;  Service: Cardiovascular;  Laterality: N/A;    ROS: Review of Systems Negative except as stated above  PHYSICAL  EXAM: There were no vitals taken for this visit.  Physical Exam  {female adult master:310786} {female adult master:310785}     Latest Ref Rng & Units 06/24/2021    1:21 PM 11/05/2020    8:34 AM 10/03/2020   12:15 PM  CMP  Glucose 70 - 99 mg/dL 86  93  80   BUN 6 - 20 mg/dL 13  14  14    Creatinine 0.44 - 1.00 mg/dL  4.33  2.95  Sodium 135 - 145 mmol/L 135  138  139   Potassium 3.5 - 5.1 mmol/L 3.8  4.0  4.0   Chloride 98 - 111 mmol/L 103  105  104   CO2 22 - 32 mmol/L 23  26  24    Calcium 8.9 - 10.3 mg/dL 9.1  8.7  8.9    Lipid Panel     Component Value Date/Time   CHOL 154 09/20/2020 1038   TRIG 32 09/20/2020 1038   HDL 68 09/20/2020 1038   CHOLHDL 2.3 09/20/2020 1038   CHOLHDL 3.0 10/22/2018 1140   LDLCALC 78 09/20/2020 1038   LDLCALC 121 (H) 10/22/2018 1140   LDLDIRECT 112 (H) 09/10/2011 0952    CBC    Component Value Date/Time   WBC 5.7 06/24/2021 1321   RBC 4.43 06/24/2021 1321   HGB 12.8 06/24/2021 1321   HGB 12.5 10/03/2020 1215   HCT 40.6 06/24/2021 1321   HCT 39.8 10/03/2020 1215   PLT 242 06/24/2021 1321   PLT 263 10/03/2020 1215   MCV 91.6 06/24/2021 1321   MCV 88 10/03/2020 1215   MCH 28.9 06/24/2021 1321   MCHC 31.5 06/24/2021 1321   RDW 13.3 06/24/2021 1321   RDW 12.5 10/03/2020 1215   LYMPHSABS 1.8 10/03/2020 1215   MONOABS 459 10/12/2015 1009   EOSABS 0.2 10/03/2020 1215   BASOSABS 0.0 10/03/2020 1215    ASSESSMENT AND PLAN:  There are no diagnoses linked to this encounter.   Patient was given the opportunity to ask questions.  Patient verbalized understanding of the plan and was able to repeat key elements of the plan. Patient was given clear instructions to go to Emergency Department or return to medical center if symptoms don't improve, worsen, or new problems develop.The patient verbalized understanding.   No orders of the defined types were placed in this encounter.    Requested Prescriptions    No prescriptions requested  or ordered in this encounter    No follow-ups on file.  Camillia Herter, NP

## 2022-01-14 ENCOUNTER — Encounter: Payer: No Typology Code available for payment source | Admitting: Family

## 2022-01-14 DIAGNOSIS — Z13228 Encounter for screening for other metabolic disorders: Secondary | ICD-10-CM

## 2022-01-14 DIAGNOSIS — Z13220 Encounter for screening for lipoid disorders: Secondary | ICD-10-CM

## 2022-01-14 DIAGNOSIS — Z131 Encounter for screening for diabetes mellitus: Secondary | ICD-10-CM

## 2022-01-14 DIAGNOSIS — Z1329 Encounter for screening for other suspected endocrine disorder: Secondary | ICD-10-CM

## 2022-01-14 DIAGNOSIS — Z130 Encounter for screening for diseases of the blood and blood-forming organs and certain disorders involving the immune mechanism: Secondary | ICD-10-CM

## 2022-01-14 DIAGNOSIS — Z0000 Encounter for general adult medical examination without abnormal findings: Secondary | ICD-10-CM

## 2022-01-20 ENCOUNTER — Encounter (HOSPITAL_BASED_OUTPATIENT_CLINIC_OR_DEPARTMENT_OTHER): Payer: Self-pay | Admitting: Internal Medicine

## 2022-01-20 DIAGNOSIS — I456 Pre-excitation syndrome: Secondary | ICD-10-CM

## 2022-01-20 DIAGNOSIS — Z79899 Other long term (current) drug therapy: Secondary | ICD-10-CM

## 2022-01-21 NOTE — Telephone Encounter (Signed)
Patient feels fine at the moment, but this past weekend (Saturday), she had afib rate of 115-120 and chest pain. When the heart rates slows to normal rate, chest pain resolves. Chest pain as mid chest pressure and stabbing pain. She has dizziness when episode occurs. DX with WPW. Appointment mad with Harriet Pho, FNP-BC for 9/15.

## 2022-01-23 ENCOUNTER — Other Ambulatory Visit: Payer: Self-pay

## 2022-01-23 DIAGNOSIS — Z79899 Other long term (current) drug therapy: Secondary | ICD-10-CM

## 2022-01-23 DIAGNOSIS — I456 Pre-excitation syndrome: Secondary | ICD-10-CM

## 2022-01-23 NOTE — Progress Notes (Unsigned)
Cardiology Clinic Note   Patient Name: Candace [name] Date of Encounter: 01/24/2022  Primary Care Provider:  Rema Fendt, NP Primary Cardiologist:  Chrystie Nose, MD  Patient Profile    36 year old female with history of WPW followed by Dr. 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  Flecainide was therefore increased to 100 mg twice daily. She was on Toprol tartrate 12.5 mg for break through tachycardia but this caused hypotension.  Other history includes borderline diabetes, GERD, anemia, hyperlipidemia, chronic dyspnea, chronic chest pain.  Echocardiogram in 2022 revealed asymmetric left ventricular hypertrophy of the inferior lateral segment, LVEF of 50% to 55%.  She also has a chronic RBBB bundle branch block.  There is no evidence of infiltrative disease per cardiac MRI.  There was found to be an myocardial stripe in the basal septum but was felt to be nonspecific.  Past Medical History    Past Medical History:  Diagnosis Date   Allergy    Anemia    Anxiety    BV (bacterial vaginosis) 12/15/2012   Chest pain    Diabetes (HCC)    borderline DM per patient - denies DM   Eczema    GERD (gastroesophageal reflux disease)    Headache(784.0)    History of chlamydia    History of gonorrhea    Hyperlipidemia    Phreesia 09/03/2020   Irregular heartbeat    Left bundle branch block    Shortness of breath    Tachycardia    Vaginal itching 12/15/2012   Vaginal Pap smear, abnormal    f/u ok   WPW (Wolff-Parkinson-White syndrome)    Past Surgical History:  Procedure Laterality Date   30 HOUR PH STUDY N/A 07/25/2020   Procedure: 24 HOUR PH STUDY;  Surgeon: Shellia Cleverly, DO;  Location: [facility];  Service: Gastroenterology;  Laterality: N/A;   CERVICAL CONIZATION W/BX N/A 12/22/2018   Procedure: LASER ABLATION OF CERVIX;  Surgeon: Lazaro Arms, MD;  Location: [facility];  Service: Gynecology;  Laterality: N/A;   DILATION AND CURETTAGE OF UTERUS N/A 11/22/2012   Procedure: SUCTION DILATATION AND CURETTAGE;  Surgeon: Lazaro Arms, MD;  Location: [facility];  Service: Gynecology;  Laterality: N/A;   ESOPHAGEAL MANOMETRY N/A 07/25/2020   Procedure: ESOPHAGEAL MANOMETRY (EM);  Surgeon: Shellia Cleverly, DO;  Location: [facility];  Service: Gastroenterology;  Laterality: N/A;   SVT ABLATION N/A 11/05/2020   Procedure: SVT ABLATION;  Surgeon: Marinus Maw, MD;  Location: [facility] INVASIVE CV LAB;  Service: Cardiovascular;  Laterality: N/A;    Allergies  Allergies  Allergen Reactions   Vicodin [Hydrocodone-Acetaminophen] Other (See Comments)    violent   Percocet [Oxycodone-Acetaminophen]     Violent behaviors   Latex Itching and Rash   Other     Grass     History of Present Illness    [name] returns to the office today for follow with hx of WPW followed by Dr.Taylor, GERD, and recurrent chest pain. She was placed on PPI on last office visit 07/19/2021.  [name] comes today with complaints of frequent breakthrough rapid heart rhythms 3-4 times a week.  This is not associated with any activity.  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    Duration lasts anywhere from 1 to 2 minutes and subsides on its own dropping her heart rate into the 40s or 50s.  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  Her watch alarms when her heart rate is elevated.  With these palpitations she occasionally  has stabbing chest pain, and has had one incidence when she felt as if there was "an elephant on my chest" with associated shortness of breath.  Home Medications    Current Outpatient Medications  Medication Sig Dispense Refill   atorvastatin (LIPITOR) 40 MG tablet Take 1 tablet (40 mg total) by mouth daily. 90 tablet 0   flecainide (TAMBOCOR) 100 MG tablet Take 1 tablet (100 mg total) by mouth 2 (two) times daily. 180 tablet 2   flecainide (TAMBOCOR) 50 MG tablet Take 1 tablet (50 mg total) by mouth 2 (two) times daily. 180 tablet 3   levonorgestrel (MIRENA) 20 MCG/24HR IUD 1 each by Intrauterine route once.     Vitamin D, Ergocalciferol, (DRISDOL) 1.25 MG (50000 UNIT) CAPS capsule Take 1 capsule (50,000 Units total) by mouth once a week. 4 capsule 0   No current facility-administered medications for this visit.     Family History    Family History  Problem Relation Age of Onset   Prostate cancer Paternal Grandfather    Breast cancer Paternal Grandmother    Lung cancer Paternal Grandmother    Lung cancer Maternal Grandfather    Colon polyps Father    Prostate cancer Father    Healthy Brother    Healthy Daughter    Healthy Son    Breast cancer Maternal Aunt        Multiple with Breast Cancer   Cervical cancer Maternal Aunt    Stroke Maternal Aunt    Hypertension Other    Diabetes Other    Other Other        stomach tumors   Heart disease Other        her mom, gma and aunts have arrythmia   Kidney disease Paternal Aunt    Colon cancer Paternal Aunt        colon problems   Stroke Paternal Uncle        PGreatUncle   Prostate cancer Paternal Uncle    Breast cancer Cousin    Esophageal cancer Neg Hx    Rectal cancer Neg Hx    Stomach cancer Neg Hx    She indicated that her mother is alive. She indicated that her father is alive. She indicated that her brother is alive. She indicated that her maternal grandmother is alive. She indicated that her maternal grandfather is  deceased. She indicated that her paternal grandmother is deceased. She indicated that her paternal grandfather is deceased. She indicated that her daughter is alive. She indicated that her son is alive. She indicated that the status of her maternal aunt is unknown. She indicated that the status of her paternal aunt is unknown. She indicated that the status of her paternal uncle is unknown. She indicated that the status of her cousin is unknown. She indicated that the status of her neg  hx is unknown. She indicated that the status of her other is unknown.  Social History    Social History   Socioeconomic History   Marital status: Single    Spouse name: Not on file   Number of children: 2   Years of education: Not on file   Highest education level: Some college, no degree  Occupational History   Occupation: CNA    Comment: [employer]  Tobacco Use   Smoking status: Never   Smokeless tobacco: Never  Vaping Use   Vaping Use: Never used  Substance and Sexual Activity   Alcohol use: Not Currently    Comment: socially    Drug use: No   Sexual activity: Yes    Birth control/protection: I.U.D.  Other Topics Concern   Not on file  Social History Narrative   Not on file   Social Determinants of Health   Financial Resource Strain: Medium Risk (04/22/2021)   Overall Financial Resource Strain (CARDIA)    Difficulty of Paying Living Expenses: Somewhat hard  Food Insecurity: No Food Insecurity (04/22/2021)   Hunger Vital Sign    Worried About Running Out of Food in the Last Year: Never true    Ran Out of Food in the Last Year: Never true  Transportation Needs: No Transportation Needs (04/22/2021)   PRAPARE - Administrator, Civil Service (Medical): No    Lack of Transportation (Non-Medical): No  Physical Activity: Insufficiently Active (04/22/2021)   Exercise Vital Sign    Days of Exercise per Week: 2 days    Minutes of Exercise per Session: 30 min  Stress: No Stress  Concern Present (04/22/2021)   [employer] of Occupational Health - Occupational Stress Questionnaire    Feeling of Stress : Only a little  Social Connections: Moderately Integrated (04/22/2021)   Social Connection and Isolation Panel [NHANES]    Frequency of Communication with Friends and Family: More than three times a week    Frequency of Social Gatherings with Friends and Family: More than three times a week    Attends Religious Services: More than 4 times per year    Active Member of [employer] or Organizations: Yes    Attends Banker Meetings: More than 4 times per year    Marital Status: Never married  Intimate Partner Violence: Not At Risk (04/22/2021)   Humiliation, Afraid, Rape, and Kick questionnaire    Fear of Current or Ex-Partner: No    Emotionally Abused: No    Physically Abused: No    Sexually Abused: No     Review of Systems    General:  No chills, fever, night sweats or weight changes.  Cardiovascular:  No chest pain, dyspnea on exertion, edema, orthopnea, increased frequency of palpitations, no paroxysmal nocturnal dyspnea. Dermatological: No rash, lesions/masses Respiratory: No cough, dyspnea Urologic: No hematuria, dysuria Abdominal:   No nausea, vomiting, diarrhea, bright red blood per rectum, melena, or hematemesis Neurologic:  No visual changes, wkns, changes in mental status. All other systems reviewed and are otherwise negative except as noted above.     Physical Exam    VS:  BP 102/88   Pulse (!) 112   Ht 5' 2.25" (1.581 m)   Wt 209 lb 9.6 oz (95.1 kg)   SpO2 98%   BMI 38.03 kg/m  , BMI Body mass index is 38.03 kg/m.     GEN: Well nourished, well developed, in no acute distress. HEENT: normal. Neck: Supple, no JVD, carotid  bruits, or masses. Cardiac: RRR, tachycardic, no murmurs, rubs, or gallops. No clubbing, cyanosis, edema.  Radials/DP/PT 2+ and equal bilaterally.  Respiratory:  Respirations regular and unlabored, clear to  auscultation bilaterally. GI: Soft, nontender, nondistended, BS + x 4. MS: no deformity or atrophy. Skin: warm and dry, no rash. Neuro:  Strength and sensation are intact. Psych: Normal affect.  Accessory Clinical Findings    ECG personally reviewed by me today-sinus tachycardia, right bundle branch block, inferior lateral Q waves, heart rate 112 bpm- No acute changes  Lab Results  Component Value Date   WBC 5.7 06/24/2021   HGB 12.8 06/24/2021   HCT 40.6 06/24/2021   MCV 91.6 06/24/2021   PLT 242 06/24/2021   Lab Results  Component Value Date   CREATININE 1.06 (H) 01/23/2022   BUN 17 01/23/2022   NA 139 01/23/2022   K 4.5 01/23/2022   CL 102 01/23/2022   CO2 23 01/23/2022   Lab Results  Component Value Date   ALT 15 06/13/2020   AST 15 06/13/2020   ALKPHOS 65 06/13/2020   BILITOT 1.0 06/13/2020   Lab Results  Component Value Date   CHOL 154 09/20/2020   HDL 68 09/20/2020   LDLCALC 78 09/20/2020   LDLDIRECT 112 (H) 09/10/2011   TRIG 32 09/20/2020   CHOLHDL 2.3 09/20/2020    Lab Results  Component Value Date   HGBA1C 5.6 06/13/2020    Review of Prior Studies: Exercise stress test: 01/15/2021 (s/p Flecainide).   No ST deviation was noted.   Fair exercise capacity, achieved 7.7 METS Did not reach target HR, max HR 148 bpm, which is 80% of max age predicted HR Normal blood pressure response to exercise No EKG evidence of ischemia, but nondiagnostic study due to failure to meet target HR No stress induced arrhythmia or QRS prolongation noted     Cardiac MRI 09/14/2020 IMPRESSION: 1. Low normal left ventricular systolic function. LVEF 51%. Upper limit of normal left ventricular indexed volume.   2. Left ventricular basal septal thinning, 5 mm. Abnormal septal motion due to conduction delay. Mild hypokinesis of basal inferoseptum.   3. Normal right ventricular chamber size and systolic function. RVEF 53%.   4. There is a mild mid-myocardial stripe of  post-contrast delayed enhancement in the basal septum. This is a nonspecific finding. No findings to suggest scar, fibrosis, infarction, inflammatory or infiltrative process.  Assessment & Plan   1.  WPW: Having more frequent breakthrough rapid heart rhythm despite use of flecainide 100 mg twice daily.  She is unable to take metoprolol for breakthrough tachycardia due to hypotension.  She states the episodes last 1 to 2 minutes and can occur with and without activity.  On occasion she has severe heaviness in her chest when heart rate is very elevated.   I will add a "pill in a pocket" flecainide 50 mg as needed for sustained heart rate elevation greater than 10 minutes.  I will have her referred back to Dr. Ladona Ridgel for sooner appointment than previously scheduled so that he can make further recommendations.  I would like to place a ZIO monitor on her to confirm frequency and duration of rapid heart rhythm.  If she is able to see Dr. Ladona Ridgel soon I will hold off on this.        Current medicines are reviewed at length with the patient today.  I have spent 25 min's  dedicated to the care of this patient on the date of this  encounter to include pre-visit review of records, assessment, management and diagnostic testing,with shared decision making. Signed, Bettey Mare. Liborio Nixon, ANP, AACC   01/24/2022 2:13 PM      Office [phone] Fax [phone]  Notice: This dictation was prepared with Dragon dictation along with smaller phrase technology. Any transcriptional errors that result from this process are unintentional and may not be corrected upon review.

## 2022-01-24 ENCOUNTER — Other Ambulatory Visit (HOSPITAL_COMMUNITY): Payer: Self-pay

## 2022-01-24 ENCOUNTER — Encounter: Payer: Self-pay | Admitting: Adult Health

## 2022-01-24 ENCOUNTER — Ambulatory Visit: Payer: No Typology Code available for payment source | Attending: Adult Health | Admitting: Adult Health

## 2022-01-24 VITALS — BP 102/88 | HR 112 | Ht 62.25 in | Wt 209.6 lb

## 2022-01-24 DIAGNOSIS — I456 Pre-excitation syndrome: Secondary | ICD-10-CM

## 2022-01-24 LAB — BASIC METABOLIC PANEL
BUN/Creatinine Ratio: 16 (ref 9–23)
BUN: 17 mg/dL (ref 6–20)
CO2: 23 mmol/L (ref 20–29)
Calcium: 9.1 mg/dL (ref 8.7–10.2)
Chloride: 102 mmol/L (ref 96–106)
Creatinine, Ser: 1.06 mg/dL — ABNORMAL HIGH (ref 0.57–1.00)
Glucose: 109 mg/dL — ABNORMAL HIGH (ref 70–99)
Potassium: 4.5 mmol/L (ref 3.5–5.2)
Sodium: 139 mmol/L (ref 134–144)
eGFR: 70 mL/min/{1.73_m2} (ref >59)

## 2022-01-24 LAB — TSH: TSH: 2.09 u[IU]/mL (ref 0.450–4.500)

## 2022-01-24 MED ORDER — FLECAINIDE ACETATE 50 MG PO TABS
50.0000 mg | ORAL_TABLET | Freq: Two times a day (BID) | ORAL | 3 refills | Status: DC
  Filled 2022-01-24: qty 180, 90d supply, fill #0

## 2022-01-24 NOTE — Patient Instructions (Signed)
Medication Instructions:  Start Flecainide 50 mg ( Take As Needed for Breakthrough Rapid heart Rate). *If you need a refill on your cardiac medications before your next appointment, please call your pharmacy*   Lab Work: No Labs If you have labs (blood work) drawn today and your tests are completely normal, you will receive your results only by: MyChart Message (if you have MyChart) OR A paper copy in the mail If you have any lab test that is abnormal or we need to change your treatment, we will call you to review the results.   Testing/Procedures: No Testing   Follow-Up: At [facility] you and your health needs are our priority.  As part of our continuing mission to provide you with exceptional heart care, we have created designated Provider Care Teams.  These Care Teams include your primary Cardiologist (physician) and Advanced Practice Providers (APPs -  Physician Assistants and Nurse Practitioners) who all work together to provide you with the care you need, when you need it.  We recommend signing up for the patient portal called "MyChart".  Sign up information is provided on this After Visit Summary.  MyChart is used to connect with patients for Virtual Visits (Telemedicine).  Patients are able to view lab/test results, encounter notes, upcoming appointments, etc.  Non-urgent messages can be sent to your provider as well.   To learn more about what you can do with MyChart, go to [email or web address].    Your next appointment:   First Available ( Urgent)  The format for your next appointment:   In Person  Provider:   Lewayne Bunting, MD

## 2022-01-27 ENCOUNTER — Other Ambulatory Visit (HOSPITAL_COMMUNITY): Payer: Self-pay

## 2022-01-28 ENCOUNTER — Telehealth: Payer: Self-pay

## 2022-01-28 DIAGNOSIS — I456 Pre-excitation syndrome: Secondary | ICD-10-CM

## 2022-01-28 NOTE — Telephone Encounter (Signed)
-----   Message from Evans Lance, MD sent at 01/27/2022  9:02 PM EDT ----- Curt Bears, I would like a 7 day zio prior to seeing the patient so we can have a better understanding as to what her rhythm diagnosis is.  ----- Message ----- From: Lendon Colonel, NP Sent: 01/24/2022   2:20 PM EDT To: Pixie Casino, MD; Evans Lance, MD  I would like Dr.Taylor to see her sooner than previously scheduled appointment as she is having frequent breakthrough tachycardia, with what appears to be tachy-brady. HR 140 bpm, dropping to 40 bpm.

## 2022-01-28 NOTE — Telephone Encounter (Signed)
Pt called per Dr. Tanna Furry order for 7 day Zio Monitor prior to clinical appointment to discuss plan of care.    Pt called and was scheduled to see Dr. Lovena Le tomorrow, 01/29/22;  Msg sent to scheduling to reschedule after 7 day monitor data received.    Pt understood reason appt was rescheduled with Dr. Lovena Le, so monitor can be worn and data obtained.    Messages sent to make arrangements.

## 2022-01-29 ENCOUNTER — Ambulatory Visit: Payer: No Typology Code available for payment source | Admitting: Internal Medicine

## 2022-01-29 ENCOUNTER — Ambulatory Visit: Payer: No Typology Code available for payment source | Attending: Internal Medicine

## 2022-01-29 DIAGNOSIS — I456 Pre-excitation syndrome: Secondary | ICD-10-CM

## 2022-01-29 NOTE — Progress Notes (Unsigned)
Enrolled for [employer] to mail a ZIO XT long term holter monitor to the patients address on file.  

## 2022-01-31 ENCOUNTER — Ambulatory Visit: Payer: No Typology Code available for payment source | Attending: Adult Health

## 2022-01-31 ENCOUNTER — Telehealth: Payer: Self-pay

## 2022-01-31 DIAGNOSIS — I456 Pre-excitation syndrome: Secondary | ICD-10-CM

## 2022-01-31 NOTE — Progress Notes (Unsigned)
ZIO XT mailed to pt's home address. 

## 2022-01-31 NOTE — Telephone Encounter (Signed)
Called patient to advise 7 day Zio ordered for patient.

## 2022-01-31 NOTE — Addendum Note (Signed)
Addended by: Merri Ray A on: 01/31/2022 08:01 AM   Modules accepted: Orders

## 2022-02-08 ENCOUNTER — Encounter: Payer: Self-pay | Admitting: Internal Medicine

## 2022-02-10 ENCOUNTER — Encounter (HOSPITAL_BASED_OUTPATIENT_CLINIC_OR_DEPARTMENT_OTHER): Payer: Self-pay | Admitting: Internal Medicine

## 2022-02-10 ENCOUNTER — Telehealth: Payer: Self-pay | Admitting: Internal Medicine

## 2022-02-10 ENCOUNTER — Telehealth: Payer: Self-pay | Admitting: *Deleted

## 2022-02-10 ENCOUNTER — Encounter: Payer: Self-pay | Admitting: Internal Medicine

## 2022-02-10 NOTE — Telephone Encounter (Signed)
Re-enrolled patient by editing registration. Called Linton Ham at [facility].  He will overnight monitor and confirm shipment. Results may need to be manually imported.

## 2022-02-10 NOTE — Telephone Encounter (Signed)
See phone notes from 02/10/22

## 2022-02-10 NOTE — Telephone Encounter (Signed)
Patient called to report she has not received her ZIO monitor as yet.

## 2022-02-12 DIAGNOSIS — I456 Pre-excitation syndrome: Secondary | ICD-10-CM | POA: Diagnosis not present

## 2022-02-27 ENCOUNTER — Other Ambulatory Visit (HOSPITAL_COMMUNITY): Payer: Self-pay

## 2022-03-06 ENCOUNTER — Encounter: Payer: Self-pay | Admitting: Internal Medicine

## 2022-03-06 ENCOUNTER — Ambulatory Visit: Payer: No Typology Code available for payment source | Attending: Internal Medicine | Admitting: Internal Medicine

## 2022-03-06 VITALS — BP 114/80 | HR 87 | Ht 62.25 in | Wt 205.0 lb

## 2022-03-06 DIAGNOSIS — I456 Pre-excitation syndrome: Secondary | ICD-10-CM | POA: Diagnosis not present

## 2022-03-06 NOTE — Patient Instructions (Addendum)
Medication Instructions:  Your physician recommends that you continue on your current medications as directed. Please refer to the Current Medication list given to you today.  *If you need a refill on your cardiac medications before your next appointment, please call your pharmacy*  Lab Work: None ordered.  If you have labs (blood work) drawn today and your tests are completely normal, you will receive your results only by: [place] (if you have MyChart) OR A paper copy in the mail If you have any lab test that is abnormal or we need to change your treatment, we will call you to review the results.  Testing/Procedures: None ordered.  Follow-Up: At [facility], you and your health needs are our priority.  As part of our continuing mission to provide you with exceptional heart care, we have created designated Provider Care Teams.  These Care Teams include your primary Cardiologist (physician) and Advanced Practice Providers (APPs -  Physician Assistants and Nurse Practitioners) who all work together to provide you with the care you need, when you need it.  We recommend signing up for the patient portal called "MyChart".  Sign up information is provided on this After Visit Summary.  MyChart is used to connect with patients for Virtual Visits (Telemedicine).  Patients are able to view lab/test results, encounter notes, upcoming appointments, etc.  Non-urgent messages can be sent to your provider as well.   To learn more about what you can do with MyChart, go to [email or web address].    Your next appointment:   6 Month follow up requested.   The format for your next appointment:   In Person  Provider:   Cristopher Peru, MD{or one of the following Advanced Practice Providers on your designated Care Team:   Tommye Standard, [place] Legrand Como "Jonni Sanger" Chalmers Cater, [place]  Important Information About Sugar

## 2022-03-06 NOTE — Progress Notes (Signed)
HPI Ms. Langstaff returns today for followup. 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 Because of her young age, we decided to abandon additional ablation. She has been controlled on 100 mg bid of flecainide. Her AP conduction had resolved previously on flecainide. In the interim, she notes occaisional break throughs but mostly well controlled. She denies excess caffeine or ETOH. She is walking. No syncope.  Allergies  Allergen Reactions   Vicodin [Hydrocodone-Acetaminophen] Other (See Comments)    violent   Percocet [Oxycodone-Acetaminophen]     Violent behaviors   Latex Itching and Rash   Other     Grass      Current Outpatient Medications  Medication Sig Dispense Refill   atorvastatin (LIPITOR) 40 MG tablet Take 1 tablet (40 mg total) by mouth daily. 90 tablet 0   flecainide (TAMBOCOR) 100 MG tablet Take 1 tablet (100 mg total) by mouth 2 (two) times daily. 180 tablet 2   flecainide (TAMBOCOR) 50 MG tablet Take 1 tablet (50 mg total) by mouth 2 (two) times daily. 180 tablet 3   levonorgestrel (MIRENA) 20 MCG/24HR IUD 1 each by Intrauterine route once.     Vitamin D, Ergocalciferol, (DRISDOL) 1.25 MG (50000 UNIT) CAPS capsule Take 1 capsule (50,000 Units total) by mouth once a week. 4 capsule 0   No current facility-administered medications for this visit.     Past Medical History:  Diagnosis Date   Allergy    Anemia    Anxiety    BV (bacterial vaginosis) 12/15/2012   Chest pain    Diabetes (HCC)    borderline DM per patient - denies DM   Eczema    GERD (gastroesophageal reflux disease)    Headache(784.0)    History of chlamydia    History of gonorrhea    Hyperlipidemia    Phreesia 09/03/2020   Irregular heartbeat    Left bundle branch block    Shortness of breath    Tachycardia    Vaginal itching 12/15/2012   Vaginal Pap smear, abnormal    f/u ok   WPW (Wolff-Parkinson-White syndrome)      ROS:   All systems reviewed and negative except as noted in the HPI.   Past Surgical History:  Procedure Laterality Date   18 HOUR PH STUDY N/A 07/25/2020   Procedure: 24 HOUR PH STUDY;  Surgeon: Shellia Cleverly, DO;  Location: [facility];  Service: Gastroenterology;  Laterality: N/A;   CERVICAL CONIZATION W/BX N/A 12/22/2018   Procedure: LASER ABLATION OF CERVIX;  Surgeon: Lazaro Arms, MD;  Location: [facility];  Service: Gynecology;  Laterality: N/A;   DILATION AND CURETTAGE OF UTERUS N/A 11/22/2012   Procedure: SUCTION DILATATION AND CURETTAGE;  Surgeon: Lazaro Arms, MD;  Location: [facility];  Service: Gynecology;  Laterality: N/A;   ESOPHAGEAL MANOMETRY N/A 07/25/2020   Procedure: ESOPHAGEAL MANOMETRY (EM);  Surgeon: Shellia Cleverly, DO;  Location: [facility];  Service: Gastroenterology;  Laterality: N/A;   SVT ABLATION N/A 11/05/2020   Procedure: SVT ABLATION;  Surgeon: Marinus Maw, MD;  Location: [facility] INVASIVE CV LAB;  Service: Cardiovascular;  Laterality: N/A;     Family History  Problem Relation Age of Onset   Prostate cancer Paternal Grandfather    Breast cancer Paternal Grandmother    Lung cancer Paternal Grandmother    Lung cancer Maternal Grandfather    Colon polyps Father  Prostate cancer Father    Healthy Brother    Healthy Daughter    Healthy Son    Breast cancer Maternal Aunt        Multiple with Breast Cancer   Cervical cancer Maternal Aunt    Stroke Maternal Aunt    Hypertension Other    Diabetes Other    Other Other        stomach tumors   Heart disease Other        her mom, gma and aunts have arrythmia   Kidney disease Paternal Aunt    Colon cancer Paternal Aunt        colon problems   Stroke Paternal Uncle        PGreatUncle   Prostate cancer Paternal Uncle    Breast cancer Cousin    Esophageal cancer Neg Hx    Rectal cancer Neg Hx    Stomach cancer Neg Hx      Social History   Socioeconomic History   Marital status: Single     Spouse name: Not on file   Number of children: 2   Years of education: Not on file   Highest education level: Some college, no degree  Occupational History   Occupation: CNA    Comment: [employer]  Tobacco Use   Smoking status: Never   Smokeless tobacco: Never  Vaping Use   Vaping Use: Never used  Substance and Sexual Activity   Alcohol use: Not Currently    Comment: socially    Drug use: No   Sexual activity: Yes    Birth control/protection: I.U.D.  Other Topics Concern   Not on file  Social History Narrative   Not on file   Social Determinants of Health   Financial Resource Strain: Medium Risk (04/22/2021)   Overall Financial Resource Strain (CARDIA)    Difficulty of Paying Living Expenses: Somewhat hard  Food Insecurity: No Food Insecurity (04/22/2021)   Hunger Vital Sign    Worried About Running Out of Food in the Last Year: Never true    Ran Out of Food in the Last Year: Never true  Transportation Needs: No Transportation Needs (04/22/2021)   PRAPARE - Administrator, Civil Service (Medical): No    Lack of Transportation (Non-Medical): No  Physical Activity: Insufficiently Active (04/22/2021)   Exercise Vital Sign    Days of Exercise per Week: 2 days    Minutes of Exercise per Session: 30 min  Stress: No Stress Concern Present (04/22/2021)   [employer] of Occupational Health - Occupational Stress Questionnaire    Feeling of Stress : Only a little  Social Connections: Moderately Integrated (04/22/2021)   Social Connection and Isolation Panel [NHANES]    Frequency of Communication with Friends and Family: More than three times a week    Frequency of Social Gatherings with Friends and Family: More than three times a week    Attends Religious Services: More than 4 times per year    Active Member of [employer] or Organizations: Yes    Attends Banker Meetings: More than 4 times per year    Marital Status: Never married  Intimate  Partner Violence: Not At Risk (04/22/2021)   Humiliation, Afraid, Rape, and Kick questionnaire    Fear of Current or Ex-Partner: No    Emotionally Abused: No    Physically Abused: No    Sexually Abused: No     BP 114/80   Pulse 87   Ht 5'  2.25" (1.581 m)   Wt 205 lb (93 kg)   SpO2 99%   BMI 37.19 kg/m   Physical Exam:  Overweight but well appearing NAD HEENT: Unremarkable Neck:  No JVD, no thyromegally Lymphatics:  No adenopathy Back:  No CVA tenderness Lungs:  Clear HEART:  Regular rate rhythm, no murmurs, no rubs, no clicks Abd:  soft, positive bowel sounds, no organomegally, no rebound, no guarding Ext:  2 plus pulses, no edema, no cyanosis, no clubbing Skin:  No rashes no nodules Neuro:  CN II through XII intact, motor grossly intact  EKG - nsr with RBBB  Assess/Plan:  WPW - she is doing well with medical therapy of her antero septal AP. Continue flecainide. Obesity - she has lost 5 lbs since her last visit.   Carleene Overlie Aisa Schoeppner,MD

## 2022-03-07 ENCOUNTER — Telehealth: Payer: Self-pay

## 2022-03-07 NOTE — Telephone Encounter (Signed)
-----   Message from Evans Lance, MD sent at 03/06/2022  9:01 PM EDT ----- Normal heart monitor with no SVT or evidence of WPW.

## 2022-04-29 ENCOUNTER — Other Ambulatory Visit: Payer: No Typology Code available for payment source | Admitting: Adult Health

## 2022-04-30 ENCOUNTER — Encounter: Payer: Self-pay | Admitting: Adult Health

## 2022-04-30 ENCOUNTER — Other Ambulatory Visit (HOSPITAL_COMMUNITY): Payer: Self-pay

## 2022-04-30 ENCOUNTER — Other Ambulatory Visit: Payer: Self-pay | Admitting: Internal Medicine

## 2022-04-30 ENCOUNTER — Ambulatory Visit (INDEPENDENT_AMBULATORY_CARE_PROVIDER_SITE_OTHER): Payer: No Typology Code available for payment source | Admitting: Adult Health

## 2022-04-30 VITALS — BP 99/74 | HR 86 | Ht 62.25 in | Wt 203.0 lb

## 2022-04-30 DIAGNOSIS — Z975 Presence of (intrauterine) contraceptive device: Secondary | ICD-10-CM

## 2022-04-30 DIAGNOSIS — Z01419 Encounter for gynecological examination (general) (routine) without abnormal findings: Secondary | ICD-10-CM | POA: Diagnosis not present

## 2022-04-30 MED ORDER — ATORVASTATIN CALCIUM 40 MG PO TABS
40.0000 mg | ORAL_TABLET | Freq: Every day | ORAL | 3 refills | Status: DC
  Filled 2022-04-30: qty 90, 90d supply, fill #0
  Filled 2022-07-24: qty 90, 90d supply, fill #1
  Filled 2022-11-06: qty 90, 90d supply, fill #2
  Filled 2023-02-02 (×2): qty 90, 90d supply, fill #3

## 2022-04-30 NOTE — Progress Notes (Signed)
Patient ID: Candace [name], female   DOB: [birth year], 36 y.o.   MRN: [id number] History of Present Illness: Candace [name] is a 36 year old black female,single, G3P2012, in for a well woman gyn exam. She has noticed that periods dark brown the entire time now. She has WPW and sees cardiology, she had SVT ablation, but it did not work.  Last pap was negative HPV and malignancy 04/22/21.  PCP is Ricky Stabs NP.    Current Medications, Allergies, Past Medical History, Past Surgical History, Family History and Social History were reviewed in [employer] record.     Review of Systems: Patient denies any headaches, hearing loss, fatigue, blurred vision, shortness of breath, chest pain, abdominal pain, problems with bowel movements, urination, or intercourse. No joint pain or mood swings.  She HPI for positives.    Physical Exam:BP 99/74 (BP Location: Left Arm, Patient Position: Sitting, Cuff Size: Normal)   Pulse 86   Ht 5' 2.25" (1.581 m)   Wt 203 lb (92.1 kg)   LMP 04/21/2022   BMI 36.83 kg/m   General:  Well developed, well nourished, no acute distress Skin:  Warm and dry Neck:  Midline trachea, normal thyroid, good ROM, no lymphadenopathy Lungs; Clear to auscultation bilaterally Breast:  No dominant palpable mass, retraction, or nipple discharge Cardiovascular: Regular rate and rhythm Abdomen:  Soft, non tender, no hepatosplenomegaly Pelvic:  External genitalia is normal in appearance, no lesions.  The vagina is normal in appearance. Urethra has no lesions or masses. The cervix is smooth, +IUD string at os.  Uterus is felt to be normal size, shape, and contour.  No adnexal masses or tenderness noted.Bladder is non tender, no masses felt. Extremities/musculoskeletal:  No swelling or varicosities noted, no clubbing or cyanosis Psych:  No mood changes, alert and cooperative,seems happy AA is 1 Fall risk is moderate    04/30/2022   11:49 AM 04/22/2021    2:27 PM  04/01/2021    2:53 PM  Depression screen PHQ 2/9  Decreased Interest 0 0 0  Down, Depressed, Hopeless 0 0 0  PHQ - 2 Score 0 0 0  Altered sleeping 0 1   Tired, decreased energy 0 0   Change in appetite 0 0   Feeling bad or failure about yourself  0 0   Trouble concentrating 0 0   Moving slowly or fidgety/restless 0 0   Suicidal thoughts 0 0   PHQ-9 Score 0 1        04/30/2022   11:49 AM 04/22/2021    2:27 PM  GAD 7 : Generalized Anxiety Score  Nervous, Anxious, on Edge 0 0  Control/stop worrying 0 0  Worry too much - different things 0 0  Trouble relaxing 0 0  Restless 0 0  Easily annoyed or irritable 0 0  Afraid - awful might happen 0 0  Total GAD 7 Score 0 0      Upstream - 04/30/22 1148       Pregnancy Intention Screening   Does the patient want to become pregnant in the next year? Unsure    Does the patient's partner want to become pregnant in the next year? Unsure    Would the patient like to discuss contraceptive options today? No      Contraception Wrap Up   Current Method IUD or IUS    End Method IUD or IUS            Pt gave verbal consent  for exam without chaperone.  Impression and Plan: 1. Encounter for well woman exam with routine gynecological exam Physical in 1 year Pap in 2025 Labs with cardiology  2. IUD (intrauterine device) in place Mirena placed 01/28/2019

## 2022-07-24 ENCOUNTER — Other Ambulatory Visit (HOSPITAL_COMMUNITY): Payer: Self-pay

## 2022-09-03 ENCOUNTER — Other Ambulatory Visit (HOSPITAL_COMMUNITY): Payer: Self-pay

## 2022-09-03 ENCOUNTER — Other Ambulatory Visit: Payer: Self-pay | Admitting: Internal Medicine

## 2022-09-03 MED ORDER — FLECAINIDE ACETATE 100 MG PO TABS
100.0000 mg | ORAL_TABLET | Freq: Two times a day (BID) | ORAL | 2 refills | Status: DC
  Filled 2022-09-03: qty 180, 90d supply, fill #0
  Filled 2022-12-10: qty 180, 90d supply, fill #1
  Filled 2023-03-06: qty 180, 90d supply, fill #2

## 2022-11-06 ENCOUNTER — Other Ambulatory Visit (HOSPITAL_COMMUNITY): Payer: Self-pay

## 2023-02-02 ENCOUNTER — Other Ambulatory Visit: Payer: Self-pay

## 2023-02-02 ENCOUNTER — Other Ambulatory Visit (HOSPITAL_COMMUNITY): Payer: Self-pay

## 2023-02-03 ENCOUNTER — Other Ambulatory Visit (HOSPITAL_BASED_OUTPATIENT_CLINIC_OR_DEPARTMENT_OTHER): Payer: Self-pay

## 2023-04-21 ENCOUNTER — Other Ambulatory Visit (HOSPITAL_COMMUNITY): Payer: Self-pay

## 2023-04-21 ENCOUNTER — Ambulatory Visit: Payer: 59 | Attending: Student | Admitting: Student

## 2023-04-21 ENCOUNTER — Encounter: Payer: Self-pay | Admitting: Student

## 2023-04-21 ENCOUNTER — Other Ambulatory Visit: Payer: Self-pay

## 2023-04-21 VITALS — BP 100/72 | HR 86 | Ht 62.25 in | Wt 201.6 lb

## 2023-04-21 DIAGNOSIS — I456 Pre-excitation syndrome: Secondary | ICD-10-CM | POA: Diagnosis not present

## 2023-04-21 DIAGNOSIS — R002 Palpitations: Secondary | ICD-10-CM | POA: Diagnosis not present

## 2023-04-21 MED ORDER — FLECAINIDE ACETATE 50 MG PO TABS
50.0000 mg | ORAL_TABLET | Freq: Every day | ORAL | 3 refills | Status: AC | PRN
  Filled 2023-04-21: qty 45, 45d supply, fill #0

## 2023-04-21 MED ORDER — BISOPROLOL FUMARATE 5 MG PO TABS
5.0000 mg | ORAL_TABLET | Freq: Every day | ORAL | 3 refills | Status: DC
  Filled 2023-04-21: qty 90, 90d supply, fill #0
  Filled 2023-07-14: qty 90, 90d supply, fill #1
  Filled 2023-10-13: qty 90, 90d supply, fill #2
  Filled 2024-01-11: qty 90, 90d supply, fill #3

## 2023-04-21 NOTE — Patient Instructions (Signed)
Medication Instructions:  1.Start bisoprolol 5 mg daily at bedtime 2.Change flecainide 50 mg to daily as needed *If you need a refill on your cardiac medications before your next appointment, please call your pharmacy*  Lab Work: None ordered If you have labs (blood work) drawn today and your tests are completely normal, you will receive your results only by: MyChart Message (if you have MyChart) OR A paper copy in the mail If you have any lab test that is abnormal or we need to change your treatment, we will call you to review the results.  Follow-Up: At [facility], you and your health needs are our priority.  As part of our continuing mission to provide you with exceptional heart care, we have created designated Provider Care Teams.  These Care Teams include your primary Cardiologist (physician) and Advanced Practice Providers (APPs -  Physician Assistants and Nurse Practitioners) who all work together to provide you with the care you need, when you need it.  Your next appointment:   4 week(s)  Provider:   Casimiro Needle "Otilio Saber, PA-C

## 2023-04-21 NOTE — Progress Notes (Signed)
  Electrophysiology Office Note:   Date:  04/21/2023  ID:  Candace [name], DOB Sep 19, 1985, MRN [id number]  Primary Cardiologist: Chrystie Nose, MD Electrophysiologist: None      History of Present Illness:   Candace [name] is a 37 y.o. female with h/o WPW and obesity seen today for routine electrophysiology followup.   Since last being seen in our clinic the patient reports doing OK until about 6 weeks ago. She has started having breakthrough SVT, about 3-4 times a week. She will feel palpitations and HRs about 181-186 by her apple watch. Denies recent illness or missed medications. She did lose her boyfriend suddenly from cardiac issues on 11/5, but specifies that her symptoms started a couple of weeks before.   Has not tried any BB or any prn flecainide. No syncope, SOB, or edema.  Review of systems complete and found to be negative unless listed in HPI.   EP Information / Studies Reviewed:    EKG is ordered today. Personal review as below.     EKG today shows NSR at 78 bpm with PR interval 184 ms and QRS 136 ms  Arrhythmia History  EPS 10/2020 Unsuccessful EP study and catheter ablation of a manifest anteroseptal/supra septal accessory pathway. RF energy could not be delivered close enough to the patient's pathway secondary to concerns about complete heart block.   Physical Exam:   VS:  BP 100/72   Pulse 86   Ht 5' 2.25" (1.581 m)   Wt 201 lb 9.6 oz (91.4 kg)   SpO2 97%   BMI 36.58 kg/m    Wt Readings from Last 3 Encounters:  04/21/23 201 lb 9.6 oz (91.4 kg)  04/30/22 203 lb (92.1 kg)  03/06/22 205 lb (93 kg)     GEN: Well nourished, well developed in no acute distress NECK: No JVD; No carotid bruits CARDIAC: Regular rate and rhythm, no murmurs, rubs, gallops RESPIRATORY:  Clear to auscultation without rales, wheezing or rhonchi  ABDOMEN: Soft, non-tender, non-distended EXTREMITIES:  No edema; No deformity   ASSESSMENT AND PLAN:    WPW Not currently ablation  candidate as pathway is too close to conduction.  Continue flecainide 100 mg BID Add bisoprolol 5 mg at bedtime. Didn't tolerate metoprolol with hypotension Add flecainide 50 mg prn for episodes that last > 10 minutes   Obesity Body mass index is 36.58 kg/m.  Encouraged lifestyle modification    Follow up with EP APP  in 4-6 weeks to check back in.    Signed, Graciella Freer, PA-C

## 2023-04-22 ENCOUNTER — Ambulatory Visit: Payer: 59 | Admitting: Student

## 2023-04-28 ENCOUNTER — Other Ambulatory Visit: Payer: Self-pay | Admitting: Internal Medicine

## 2023-04-28 ENCOUNTER — Other Ambulatory Visit (HOSPITAL_COMMUNITY): Payer: Self-pay

## 2023-04-28 MED ORDER — ATORVASTATIN CALCIUM 40 MG PO TABS
40.0000 mg | ORAL_TABLET | Freq: Every day | ORAL | 3 refills | Status: DC
  Filled 2023-04-28: qty 90, 90d supply, fill #0
  Filled 2023-07-29: qty 90, 90d supply, fill #1
  Filled 2023-11-11: qty 90, 90d supply, fill #2
  Filled 2024-02-05: qty 90, 90d supply, fill #3

## 2023-04-30 ENCOUNTER — Encounter: Payer: Self-pay | Admitting: Internal Medicine

## 2023-05-01 ENCOUNTER — Other Ambulatory Visit: Payer: Self-pay

## 2023-05-01 ENCOUNTER — Ambulatory Visit: Payer: 59 | Attending: Internal Medicine

## 2023-05-01 DIAGNOSIS — R55 Syncope and collapse: Secondary | ICD-10-CM

## 2023-05-01 NOTE — Progress Notes (Unsigned)
Enrolled patient for a 14 day Zio XT  monitor to be mailed to patients home  °

## 2023-05-05 DIAGNOSIS — R55 Syncope and collapse: Secondary | ICD-10-CM

## 2023-05-18 NOTE — Progress Notes (Signed)
  Electrophysiology Office Note:   Date:  05/19/2023  ID:  Candace [name], DOB 05-22-1985, MRN [id number]  Primary Cardiologist: Vinie JAYSON Maxcy, MD Electrophysiologist: Danelle Birmingham, MD      History of Present Illness:   Candace [name] is a 38 y.o. female with h/o WPW and obesity seen today for routine electrophysiology followup.   Seen 12/10 for breakthrough SVT episodes. Bisoprolol  added.  12/19 she had an episode of syncope while walking to her vehicle. She denies prodrome. Did not check vitals at the time. Her children said she was out for a couple of seconds.   Since last being seen in our clinic the patient reports doing better overall. She has had any further syncope. She has also only had minimal tachy-palpitations. Episodes last 3-5 minutes at longest. Hasn't taken any prn flecainide  or any extra bisoprolol . BP runs low chronically. Watches her fluid intake and tries to increase salt intake. Otherwise,  she denies chest pain, dyspnea, PND, orthopnea, nausea, vomiting, dizziness, edema, weight gain, or early satiety.   Review of systems complete and found to be negative unless listed in HPI.   EP Information / Studies Reviewed:    EKG is ordered today. Personal review as below.  EKG Interpretation Date/Time:  Tuesday May 19 2023 08:17:45 EST Ventricular Rate:  70 PR Interval:  188 QRS Duration:  148 QT Interval:  440 QTC Calculation: 475 R Axis:   58  Text Interpretation: Normal sinus rhythm Right bundle branch block When compared with ECG of 21-Apr-2023 08:14, No significant change was found Confirmed by Lesia Sharper [phone]) on 05/19/2023 8:34:10 AM    Arrhythmia History  EPS 10/2020 Unsuccessful EP study and catheter ablation of a manifest anteroseptal/supra septal accessory pathway. RF energy could not be delivered close enough to the patient's pathway secondary to concerns about complete heart block.   Physical Exam:   VS:  BP 98/72   Pulse 70   Ht 5'  2.25 (1.581 m)   Wt 203 lb (92.1 kg)   SpO2 99%   BMI 36.83 kg/m    Wt Readings from Last 3 Encounters:  05/19/23 203 lb (92.1 kg)  04/21/23 201 lb 9.6 oz (91.4 kg)  04/30/22 203 lb (92.1 kg)     GEN: No acute distress NECK: No JVD; No carotid bruits CARDIAC: Regular rate and rhythm, no murmurs, rubs, gallops RESPIRATORY:  Clear to auscultation without rales, wheezing or rhonchi  ABDOMEN: Soft, non-tender, non-distended EXTREMITIES:  No edema; No deformity   ASSESSMENT AND PLAN:    WPW Not currently ablation candidate as pathway is too close to conduction.  Continue flecainide  100 mg BID Can take additional 50 mg as needed for episodes that last > 10 minutes Continue bisoprolol  5 mg at bedtime. Didn't tolerate metoprolol  with hypotension   Obesity Body mass index is 36.83 kg/m.  Encouraged lifestyle modification   Syncope No further.  Occurred while upright walking with no prodrome.  DDx includes vasovagal/orthostatic, brady-arrhyhtmia, and tachy-arrhythmia.  Zio in progress. Will follow results.   Follow up with Dr. Birmingham as scheduled. Sooner pending monitor results.   Signed, Sharper Prentice Lesia, PA-C

## 2023-05-19 ENCOUNTER — Ambulatory Visit: Payer: 59 | Attending: Student | Admitting: Student

## 2023-05-19 ENCOUNTER — Encounter: Payer: Self-pay | Admitting: Student

## 2023-05-19 VITALS — BP 98/72 | HR 70 | Ht 62.25 in | Wt 203.0 lb

## 2023-05-19 DIAGNOSIS — I456 Pre-excitation syndrome: Secondary | ICD-10-CM | POA: Diagnosis not present

## 2023-05-19 DIAGNOSIS — E66812 Obesity, class 2: Secondary | ICD-10-CM | POA: Diagnosis not present

## 2023-05-19 DIAGNOSIS — Z6835 Body mass index (BMI) 35.0-35.9, adult: Secondary | ICD-10-CM

## 2023-05-19 DIAGNOSIS — R55 Syncope and collapse: Secondary | ICD-10-CM

## 2023-05-19 NOTE — Patient Instructions (Addendum)
 Medication Instructions:  Your physician recommends that you continue on your current medications as directed. Please refer to the Current Medication list given to you today.  *If you need a refill on your cardiac medications before your next appointment, please call your pharmacy*  Lab Work: None ordered If you have labs (blood work) drawn today and your tests are completely normal, you will receive your results only by: MyChart Message (if you have MyChart) OR A paper copy in the mail If you have any lab test that is abnormal or we need to change your treatment, we will call you to review the results.  Follow-Up: At [facility], you and your health needs are our priority.  As part of our continuing mission to provide you with exceptional heart care, we have created designated Provider Care Teams.  These Care Teams include your primary Cardiologist (physician) and Advanced Practice Providers (APPs -  Physician Assistants and Nurse Practitioners) who all work together to provide you with the care you need, when you need it.  Your next appointment:   As scheduled  Provider:   Danelle Birmingham, MD

## 2023-05-21 ENCOUNTER — Other Ambulatory Visit (HOSPITAL_COMMUNITY)
Admission: RE | Admit: 2023-05-21 | Discharge: 2023-05-21 | Disposition: A | Discharge status: Home / Self Care | Payer: Commercial Managed Care - PPO | Source: Ambulatory Visit | Attending: Adult Health | Admitting: Adult Health

## 2023-05-21 ENCOUNTER — Ambulatory Visit (INDEPENDENT_AMBULATORY_CARE_PROVIDER_SITE_OTHER): Payer: Commercial Managed Care - PPO | Admitting: Adult Health

## 2023-05-21 ENCOUNTER — Encounter: Payer: Self-pay | Admitting: Adult Health

## 2023-05-21 VITALS — BP 116/78 | HR 64 | Ht 62.25 in | Wt 202.5 lb

## 2023-05-21 DIAGNOSIS — Z975 Presence of (intrauterine) contraceptive device: Secondary | ICD-10-CM

## 2023-05-21 DIAGNOSIS — L309 Dermatitis, unspecified: Secondary | ICD-10-CM

## 2023-05-21 DIAGNOSIS — Z1331 Encounter for screening for depression: Secondary | ICD-10-CM | POA: Diagnosis not present

## 2023-05-21 DIAGNOSIS — Z01419 Encounter for gynecological examination (general) (routine) without abnormal findings: Secondary | ICD-10-CM | POA: Insufficient documentation

## 2023-05-21 NOTE — Progress Notes (Signed)
 Patient ID: Candace [name], female   DOB: 1985-08-29, 38 y.o.   MRN: [id number] History of Present Illness: Candace [name] is a 38 year old black female,single, G3P2012 in for a well woman gyn exam and pap. She says she has eczema and was just on arms, no on face, to see dermatologist. Has WPW and sees cardiologist.   PCP is Greig Drones NP   Current Medications, Allergies, Past Medical History, Past Surgical History, Family History and Social History were reviewed in [employer] record.     Review of Systems: Patient denies any headaches, hearing loss, fatigue, blurred vision, shortness of breath,  abdominal pain, problems with bowel movements, urination, or intercourse.(Not active). No joint pain or mood swings.  Has random chest pain, nothing new Occasional night sweats Has period with IUD    Physical Exam:BP 116/78 (BP Location: Left Arm, Patient Position: Sitting, Cuff Size: Normal)   Pulse 64   Ht 5' 2.25 (1.581 m)   Wt 202 lb 8 oz (91.9 kg)   LMP 04/22/2023 (Approximate)   BMI 36.74 kg/m   General:  Well developed, well nourished, no acute distress Skin:  Warm and dry, has eczema on arms Neck:  Midline trachea, normal thyroid, good ROM, no lymphadenopathy Lungs; Clear to auscultation bilaterally Breast:  No dominant palpable mass, retraction, or nipple discharge Cardiovascular: Regular rate and rhythm Abdomen:  Soft, non tender, no hepatosplenomegaly Pelvic:  External genitalia is normal in appearance, no lesions.  The vagina is normal in appearance. Urethra has no lesions or masses. The cervix is bulbous, +IUD strings at os, (short), pap with HR HPV genotyping performed .  Uterus is felt to be normal size, shape, and contour.  No adnexal masses or tenderness noted.Bladder is non tender, no masses felt. Extremities/musculoskeletal:  No swelling or varicosities noted, no clubbing or cyanosis Psych:  No mood changes, alert and cooperative,seems happy AA is  3 Fall risk is low    05/21/2023   10:21 AM 04/30/2022   11:49 AM 04/22/2021    2:27 PM  Depression screen PHQ 2/9  Decreased Interest 1 0 0  Down, Depressed, Hopeless 0 0 0  PHQ - 2 Score 1 0 0  Altered sleeping 1 0 1  Tired, decreased energy 0 0 0  Change in appetite 0 0 0  Feeling bad or failure about yourself  0 0 0  Trouble concentrating 0 0 0  Moving slowly or fidgety/restless 0 0 0  Suicidal thoughts 0 0 0  PHQ-9 Score 2 0 1       05/21/2023   10:22 AM 04/30/2022   11:49 AM 04/22/2021    2:27 PM  GAD 7 : Generalized Anxiety Score  Nervous, Anxious, on Edge 0 0 0  Control/stop worrying 0 0 0  Worry too much - different things 0 0 0  Trouble relaxing 0 0 0  Restless 0 0 0  Easily annoyed or irritable 0 0 0  Afraid - awful might happen 0 0 0  Total GAD 7 Score 0 0 0      Upstream - 05/21/23 1016       Pregnancy Intention Screening   Does the patient want to become pregnant in the next year? No    Does the patient's partner want to become pregnant in the next year? No    Would the patient like to discuss contraceptive options today? No      Contraception Wrap Up   Current Method IUD or  IUS    End Method IUD or IUS    Contraception Counseling Provided Yes            Examination chaperoned by Clarita Salt LPN  Impression and plan: 1. Encounter for gynecological examination with Papanicolaou smear of cervix (Primary) Pap sent Pap in 3 years if normal Physical in 1 year Labs with PCP and cardiologist  - Cytology - PAP( [facility])  2. IUD (intrauterine device) in place Mirena  placed 01/28/2019  3. Eczema, unspecified type See dermatologist

## 2023-05-22 LAB — CYTOLOGY - PAP
Comment: NEGATIVE
Diagnosis: NEGATIVE
High risk HPV: NEGATIVE

## 2023-05-25 DIAGNOSIS — R55 Syncope and collapse: Secondary | ICD-10-CM | POA: Diagnosis not present

## 2023-05-27 ENCOUNTER — Encounter: Payer: Self-pay | Admitting: Family

## 2023-05-27 NOTE — Telephone Encounter (Signed)
 Report to Emergency Department/Urgent Care/call 911 for immediate medical evaluation. Follow-up with Primary Care.

## 2023-05-28 NOTE — Telephone Encounter (Signed)
Spoke to patient, informed of this. Patient verbalized understanding.

## 2023-05-29 ENCOUNTER — Other Ambulatory Visit (HOSPITAL_COMMUNITY): Payer: Self-pay

## 2023-05-29 ENCOUNTER — Ambulatory Visit (HOSPITAL_COMMUNITY)
Admission: EM | Admit: 2023-05-29 | Discharge: 2023-05-29 | Disposition: A | Discharge status: Home / Self Care | Payer: Commercial Managed Care - PPO | Attending: Emergency Medicine | Admitting: Emergency Medicine

## 2023-05-29 ENCOUNTER — Encounter (HOSPITAL_COMMUNITY): Payer: Self-pay | Admitting: *Deleted

## 2023-05-29 DIAGNOSIS — R21 Rash and other nonspecific skin eruption: Secondary | ICD-10-CM | POA: Diagnosis not present

## 2023-05-29 MED ORDER — TRIAMCINOLONE ACETONIDE 0.1 % EX CREA
1.0000 | TOPICAL_CREAM | Freq: Two times a day (BID) | CUTANEOUS | 0 refills | Status: AC
  Filled 2023-05-29: qty 30, 15d supply, fill #0

## 2023-05-29 MED ORDER — PREDNISONE 10 MG PO TABS
ORAL_TABLET | ORAL | 0 refills | Status: AC
  Filled 2023-05-29: qty 28, 10d supply, fill #0

## 2023-05-29 NOTE — Discharge Instructions (Signed)
Start taking prednisone as prescribed.  You can use triamcinolone cream on your arms please do not put this on your face.  If your rash becomes worse or persist come back for reevaluation.  Try to avoid scratching as this can make the rash worse or cause infection.  You can take Benadryl as needed for itching, but this can make you drowsy so do not work or drive while taking.

## 2023-05-29 NOTE — ED Triage Notes (Signed)
Pt states she has a rash on both arm and right eye x few weeks. It is itchy. She has tried antifungal powder which helped a little.

## 2023-05-30 NOTE — ED Provider Notes (Signed)
[facility]    CSN: [id number] Arrival date & time: 05/29/23  1207      History   Chief Complaint Chief Complaint  Patient presents with   Rash    HPI Candace [name] is a 38 y.o. female.   Patient presents with itchy rash to both arms for 2 weeks that is now spread to her right eye.  Patient reports using antifungal powder that has not helped with symptoms.  Denies any known exposures/allergens.   Rash Associated symptoms: no fatigue and no fever     Past Medical History:  Diagnosis Date   Allergy    Anemia    Anxiety    BV (bacterial vaginosis) 12/15/2012   Chest pain    Diabetes (HCC)    borderline DM per patient - denies DM   Eczema    GERD (gastroesophageal reflux disease)    Headache(784.0)    History of chlamydia    History of gonorrhea    Hyperlipidemia    Phreesia 09/03/2020   Irregular heartbeat    Left bundle branch block    Shortness of breath    Tachycardia    Vaginal itching 12/15/2012   Vaginal Pap smear, abnormal    f/u ok   WPW (Wolff-Parkinson-White syndrome)     Patient Active Problem List   Diagnosis Date Noted   Encounter for well woman exam with routine gynecological exam 04/30/2022   Missed periods 04/02/2021   WPW (Wolff-Parkinson-White syndrome) 11/30/2020   Palpitations 10/02/2020   Gastroesophageal reflux disease    Cough    Regurgitation and rechewing    History of abnormal cervical Pap smear 06/13/2019   Encounter for gynecological examination with Papanicolaou smear of cervix 06/13/2019   IUD (intrauterine device) in place 06/13/2019   Encounter for insertion of Mirena IUD 01/28/2019   Prediabetes    Abnormal Pap smear of cervix 11/01/2013   Hyperlipidemia 10/17/2011   Iron deficiency anemia 09/10/2011   Eczema 09/10/2011   Seasonal allergies 09/10/2011   Obesity 09/10/2011    Past Surgical History:  Procedure Laterality Date   24 HOUR PH STUDY N/A 07/25/2020   Procedure: 24 HOUR PH STUDY;  Surgeon:  Shellia Cleverly, DO;  Location: [facility];  Service: Gastroenterology;  Laterality: N/A;   CERVICAL CONIZATION W/BX N/A 12/22/2018   Procedure: LASER ABLATION OF CERVIX;  Surgeon: Lazaro Arms, MD;  Location: [facility];  Service: Gynecology;  Laterality: N/A;   DILATION AND CURETTAGE OF UTERUS N/A 11/22/2012   Procedure: SUCTION DILATATION AND CURETTAGE;  Surgeon: Lazaro Arms, MD;  Location: [facility];  Service: Gynecology;  Laterality: N/A;   ESOPHAGEAL MANOMETRY N/A 07/25/2020   Procedure: ESOPHAGEAL MANOMETRY (EM);  Surgeon: Shellia Cleverly, DO;  Location: [facility];  Service: Gastroenterology;  Laterality: N/A;   SVT ABLATION N/A 11/05/2020   Procedure: SVT ABLATION;  Surgeon: Marinus Maw, MD;  Location: [facility] INVASIVE CV LAB;  Service: Cardiovascular;  Laterality: N/A;    OB History     Gravida  3   Para  2   Term  2   Preterm      AB  1   Living  2      SAB  1   IAB      Ectopic      Multiple      Live Births  2            Home Medications    Prior to Admission medications  Medication Sig Start Date End Date Taking? Authorizing Provider  atorvastatin (LIPITOR) 40 MG tablet Take 1 tablet (40 mg total) by mouth daily. 04/28/23  Yes Marinus Maw, MD  bisoprolol (ZEBETA) 5 MG tablet Take 1 tablet (5 mg total) by mouth at bedtime. 04/21/23  Yes Graciella Freer, PA-C  flecainide (TAMBOCOR) 100 MG tablet Take 1 tablet (100 mg total) by mouth 2 (two) times daily. 09/03/22  Yes Marinus Maw, MD  predniSONE (DELTASONE) 10 MG tablet Take 4 tablets by mouth daily for 3 days, THEN 3 tablets daily for 3 days, THEN 2 tablets daily for 2 days, THEN 1 tablet daily for 2 days. 05/29/23 06/08/23 Yes Lacretia Tindall A, NP  triamcinolone cream (KENALOG) 0.1 % Apply 1 Application topically 2 (two) times daily. 05/29/23  Yes Wynonia Lawman A, NP  flecainide (TAMBOCOR) 50 MG tablet Take 1 tablet (50 mg total) by mouth daily as needed. 04/21/23   Graciella Freer, PA-C  levonorgestrel (MIRENA) 20 MCG/24HR IUD 1 each by Intrauterine route once.    [provider]    Family History Family History  Problem Relation Age of Onset   Prostate cancer Paternal Grandfather    Breast cancer Paternal Grandmother    Lung cancer Paternal Grandmother    Lung cancer Maternal Grandfather    Colon polyps Father    Prostate cancer Father    Healthy Brother    Healthy Daughter    Healthy Son    Breast cancer Maternal Aunt        Multiple with Breast Cancer   Cervical cancer Maternal Aunt    Stroke Maternal Aunt    Hypertension Other    Diabetes Other    Other Other        stomach tumors   Heart disease Other        her mom, gma and aunts have arrythmia   Kidney disease Paternal Aunt    Colon cancer Paternal Aunt        colon problems   Stroke Paternal Uncle        PGreatUncle   Prostate cancer Paternal Uncle    Breast cancer Cousin    Esophageal cancer Neg Hx    Rectal cancer Neg Hx    Stomach cancer Neg Hx     Social History Social History   Tobacco Use   Smoking status: Never   Smokeless tobacco: Never  Vaping Use   Vaping status: Never Used  Substance Use Topics   Alcohol use: Yes    Comment: socially    Drug use: No     Allergies   Vicodin [hydrocodone-acetaminophen], Percocet [oxycodone-acetaminophen], Latex, and Other   Review of Systems Review of Systems  Constitutional:  Negative for chills, fatigue and fever.  Skin:  Positive for rash.     Physical Exam Triage Vital Signs ED Triage Vitals  Encounter Vitals Group     BP 05/29/23 1345 126/88     Systolic BP Percentile --      Diastolic BP Percentile --      Pulse Rate 05/29/23 1345 68     Resp 05/29/23 1345 16     Temp 05/29/23 1345 98.7 F (37.1 C)     Temp Source 05/29/23 1345 Oral     SpO2 05/29/23 1345 99 %     Weight --      Height --      Head Circumference --      Peak Flow --  Pain Score 05/29/23 1344 0     Pain Loc --      Pain  Education --      Exclude from Growth Chart --    No data found.  Updated Vital Signs BP 126/88 (BP Location: Left Arm)   Pulse 68   Temp 98.7 F (37.1 C) (Oral)   Resp 16   LMP 04/22/2023 (Approximate)   SpO2 99%   Visual Acuity Right Eye Distance:   Left Eye Distance:   Bilateral Distance:    Right Eye Near:   Left Eye Near:    Bilateral Near:     Physical Exam Vitals and nursing note reviewed.  Constitutional:      General: She is awake. She is not in acute distress.    Appearance: Normal appearance. She is well-developed and well-groomed. She is not ill-appearing.  Skin:    General: Skin is warm and dry.     Findings: Rash present. Rash is macular and papular.  Neurological:     Mental Status: She is alert.  Psychiatric:        Behavior: Behavior is cooperative.      UC Treatments / Results  Labs (all labs ordered are listed, but only abnormal results are displayed) Labs Reviewed - No data to display  EKG   Radiology No results found.  Procedures Procedures (including critical care time)  Medications Ordered in UC Medications - No data to display  Initial Impression / Assessment and Plan / UC Course  I have reviewed the triage vital signs and the nursing notes.  Pertinent labs & imaging results that were available during my care of the patient were reviewed by me and considered in my medical decision making (see chart for details).     Patient presented with 2-week history of itchy rash to both arms that is now spread to her right eye.  Upon assessment patient has maculopapular rash noted to bilateral arms and on right bottom eyelid.  Prescribed prednisone and triamcinolone for rash.  Discussed return precautions. Final Clinical Impressions(s) / UC Diagnoses   Final diagnoses:  Rash     Discharge Instructions      Start taking prednisone as prescribed.  You can use triamcinolone cream on your arms please do not put this on your face.  If  your rash becomes worse or persist come back for reevaluation.  Try to avoid scratching as this can make the rash worse or cause infection.  You can take Benadryl as needed for itching, but this can make you drowsy so do not work or drive while taking.    ED Prescriptions     Medication Sig Dispense Auth. Provider   predniSONE (DELTASONE) 10 MG tablet Take 4 tablets by mouth daily for 3 days, THEN 3 tablets daily for 3 days, THEN 2 tablets daily for 2 days, THEN 1 tablet daily for 2 days. 28 tablet Susann Givens, Delaine Hernandez A, NP   triamcinolone cream (KENALOG) 0.1 % Apply 1 Application topically 2 (two) times daily. 30 g Wynonia Lawman A, NP      PDMP not reviewed this encounter.   Wynonia Lawman A, NP 05/30/23 1020

## 2023-06-05 ENCOUNTER — Encounter: Payer: Self-pay | Admitting: Internal Medicine

## 2023-06-11 ENCOUNTER — Other Ambulatory Visit: Payer: Self-pay

## 2023-06-11 ENCOUNTER — Other Ambulatory Visit (HOSPITAL_COMMUNITY): Payer: Self-pay

## 2023-06-11 ENCOUNTER — Other Ambulatory Visit: Payer: Self-pay | Admitting: Internal Medicine

## 2023-06-11 MED ORDER — FLECAINIDE ACETATE 100 MG PO TABS
100.0000 mg | ORAL_TABLET | Freq: Two times a day (BID) | ORAL | 3 refills | Status: DC
  Filled 2023-06-11: qty 180, 90d supply, fill #0
  Filled 2023-09-05: qty 180, 90d supply, fill #1
  Filled 2023-12-08: qty 180, 90d supply, fill #2
  Filled 2024-03-08: qty 180, 90d supply, fill #3

## 2023-07-16 ENCOUNTER — Encounter: Payer: Self-pay | Admitting: Internal Medicine

## 2023-07-16 ENCOUNTER — Ambulatory Visit: Payer: Commercial Managed Care - PPO | Attending: Internal Medicine | Admitting: Internal Medicine

## 2023-07-16 VITALS — BP 98/66 | HR 79 | Ht 62.25 in | Wt 207.0 lb

## 2023-07-16 DIAGNOSIS — R079 Chest pain, unspecified: Secondary | ICD-10-CM

## 2023-07-16 NOTE — Patient Instructions (Signed)

## 2023-07-16 NOTE — Progress Notes (Signed)
 HPI Candace [name] returns today for followup. 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 Ablation was not attempted. She has been on flecainide. Her pre-excitation has gone and she underlying RBBB. She has about 4 episodes of SVT a year. She had a syncopal episode and wore a zio monitor which did not demonstrate any arrhythmias. She feels well. She takes an occasional extra flecainide for break through symptoms.  Allergies  Allergen Reactions   Vicodin [Hydrocodone-Acetaminophen] Other (See Comments)    violent   Percocet [Oxycodone-Acetaminophen]     Violent behaviors   Latex Itching and Rash   Other     Grass      Current Outpatient Medications  Medication Sig Dispense Refill   atorvastatin (LIPITOR) 40 MG tablet Take 1 tablet (40 mg total) by mouth daily. 90 tablet 3   bisoprolol (ZEBETA) 5 MG tablet Take 1 tablet (5 mg total) by mouth at bedtime. 90 tablet 3   flecainide (TAMBOCOR) 100 MG tablet Take 1 tablet (100 mg total) by mouth 2 (two) times daily. 180 tablet 3   flecainide (TAMBOCOR) 50 MG tablet Take 1 tablet (50 mg total) by mouth daily as needed. 45 tablet 3   levonorgestrel (MIRENA) 20 MCG/24HR IUD 1 each by Intrauterine route once.     triamcinolone cream (KENALOG) 0.1 % Apply 1 Application topically 2 (two) times daily. 30 g 0   No current facility-administered medications for this visit.     Past Medical History:  Diagnosis Date   Allergy    Anemia    Anxiety    BV (bacterial vaginosis) 12/15/2012   Chest pain    Diabetes (HCC)    borderline DM per patient - denies DM   Eczema    GERD (gastroesophageal reflux disease)    Headache(784.0)    History of chlamydia    History of gonorrhea    Hyperlipidemia    Phreesia 09/03/2020   Irregular heartbeat    Left bundle branch block    Shortness of breath    Tachycardia    Vaginal itching 12/15/2012   Vaginal Pap smear, abnormal    f/u ok   WPW (Wolff-Parkinson-White  syndrome)     ROS:   All systems reviewed and negative except as noted in the HPI.   Past Surgical History:  Procedure Laterality Date   43 HOUR PH STUDY N/A 07/25/2020   Procedure: 24 HOUR PH STUDY;  Surgeon: Shellia Cleverly, DO;  Location: [facility];  Service: Gastroenterology;  Laterality: N/A;   CERVICAL CONIZATION W/BX N/A 12/22/2018   Procedure: LASER ABLATION OF CERVIX;  Surgeon: Lazaro Arms, MD;  Location: [facility];  Service: Gynecology;  Laterality: N/A;   DILATION AND CURETTAGE OF UTERUS N/A 11/22/2012   Procedure: SUCTION DILATATION AND CURETTAGE;  Surgeon: Lazaro Arms, MD;  Location: [facility];  Service: Gynecology;  Laterality: N/A;   ESOPHAGEAL MANOMETRY N/A 07/25/2020   Procedure: ESOPHAGEAL MANOMETRY (EM);  Surgeon: Shellia Cleverly, DO;  Location: [facility];  Service: Gastroenterology;  Laterality: N/A;   SVT ABLATION N/A 11/05/2020   Procedure: SVT ABLATION;  Surgeon: Marinus Maw, MD;  Location: [facility] INVASIVE CV LAB;  Service: Cardiovascular;  Laterality: N/A;     Family History  Problem Relation Age of Onset   Prostate cancer Paternal Grandfather    Breast cancer Paternal Grandmother    Lung cancer Paternal Grandmother  Lung cancer Maternal Grandfather    Colon polyps Father    Prostate cancer Father    Healthy Brother    Healthy Daughter    Healthy Son    Breast cancer Maternal Aunt        Multiple with Breast Cancer   Cervical cancer Maternal Aunt    Stroke Maternal Aunt    Hypertension Other    Diabetes Other    Other Other        stomach tumors   Heart disease Other        her mom, gma and aunts have arrythmia   Kidney disease Paternal Aunt    Colon cancer Paternal Aunt        colon problems   Stroke Paternal Uncle        PGreatUncle   Prostate cancer Paternal Uncle    Breast cancer Cousin    Esophageal cancer Neg Hx    Rectal cancer Neg Hx    Stomach cancer Neg Hx      Social History   Socioeconomic History   Marital  status: Single    Spouse name: Not on file   Number of children: 2   Years of education: Not on file   Highest education level: Some college, no degree  Occupational History   Occupation: CNA    Comment: [employer]  Tobacco Use   Smoking status: Never   Smokeless tobacco: Never  Vaping Use   Vaping status: Never Used  Substance and Sexual Activity   Alcohol use: Yes    Comment: socially    Drug use: No   Sexual activity: Not Currently    Birth control/protection: I.U.D.  Other Topics Concern   Not on file  Social History Narrative   Not on file   Social Drivers of Health   Financial Resource Strain: Low Risk  (05/21/2023)   Overall Financial Resource Strain (CARDIA)    Difficulty of Paying Living Expenses: Not very hard  Food Insecurity: No Food Insecurity (05/21/2023)   Hunger Vital Sign    Worried About Running Out of Food in the Last Year: Never true    Ran Out of Food in the Last Year: Never true  Transportation Needs: No Transportation Needs (05/21/2023)   PRAPARE - Administrator, Civil Service (Medical): No    Lack of Transportation (Non-Medical): No  Physical Activity: Insufficiently Active (05/21/2023)   Exercise Vital Sign    Days of Exercise per Week: 3 days    Minutes of Exercise per Session: 30 min  Stress: No Stress Concern Present (05/21/2023)   [employer] of Occupational Health - Occupational Stress Questionnaire    Feeling of Stress : Only a little  Social Connections: Moderately Isolated (05/21/2023)   Social Connection and Isolation Panel [NHANES]    Frequency of Communication with Friends and Family: More than three times a week    Frequency of Social Gatherings with Friends and Family: Once a week    Attends Religious Services: 1 to 4 times per year    Active Member of [employer] or Organizations: No    Attends Banker Meetings: Never    Marital Status: Never married  Intimate Partner Violence: Not At Risk (05/21/2023)    Humiliation, Afraid, Rape, and Kick questionnaire    Fear of Current or Ex-Partner: No    Emotionally Abused: No    Physically Abused: No    Sexually Abused: No     BP 98/66  Pulse 79   Ht 5' 2.25" (1.581 m)   Wt 207 lb (93.9 kg)   SpO2 99%   BMI 37.56 kg/m   Physical Exam:  Well appearing NAD HEENT: Unremarkable Neck:  No JVD, no thyromegally Lymphatics:  No adenopathy Back:  No CVA tenderness Lungs:  Clear with no wheezes HEART:  Regular rate rhythm, no murmurs, no rubs, no clicks Abd:  soft, positive bowel sounds, no organomegally, no rebound, no guarding Ext:  2 plus pulses, no edema, no cyanosis, no clubbing Skin:  No rashes no nodules Neuro:  CN II through XII intact, motor grossly intact  EKG - nsr with RBBB  Assess/Plan: WPW - she has no pre-excitation today. She will continue the flecainide.  SVT - she wore a zio and had no arrhythmias. As her symptoms are fairly well controlled, she will continue her current meds. Syncope - if she has more I would recommend insertion of an ILR. Obesity - she has not been able to lose weight. I encouraged her to work on this.  Sharlot Gowda Tiron Suski,MD

## 2023-12-10 ENCOUNTER — Other Ambulatory Visit (HOSPITAL_COMMUNITY): Payer: Self-pay

## 2024-01-10 ENCOUNTER — Encounter: Payer: Self-pay | Admitting: Internal Medicine

## 2024-04-07 ENCOUNTER — Other Ambulatory Visit: Payer: Self-pay | Admitting: Internal Medicine

## 2024-04-07 ENCOUNTER — Other Ambulatory Visit: Payer: Self-pay | Admitting: Student

## 2024-04-07 DIAGNOSIS — E7849 Other hyperlipidemia: Secondary | ICD-10-CM

## 2024-04-11 ENCOUNTER — Other Ambulatory Visit (HOSPITAL_COMMUNITY): Payer: Self-pay

## 2024-04-11 ENCOUNTER — Other Ambulatory Visit: Payer: Self-pay | Admitting: Student

## 2024-04-11 DIAGNOSIS — R079 Chest pain, unspecified: Secondary | ICD-10-CM

## 2024-04-11 DIAGNOSIS — R55 Syncope and collapse: Secondary | ICD-10-CM

## 2024-04-11 MED ORDER — ATORVASTATIN CALCIUM 40 MG PO TABS
40.0000 mg | ORAL_TABLET | Freq: Every day | ORAL | 3 refills | Status: AC
  Filled 2024-04-11 – 2024-05-10 (×2): qty 90, 90d supply, fill #0

## 2024-04-13 ENCOUNTER — Other Ambulatory Visit (HOSPITAL_COMMUNITY): Payer: Self-pay

## 2024-04-13 ENCOUNTER — Encounter: Payer: Self-pay | Admitting: Family

## 2024-04-13 MED ORDER — FLECAINIDE ACETATE 100 MG PO TABS
100.0000 mg | ORAL_TABLET | Freq: Two times a day (BID) | ORAL | 3 refills | Status: AC
  Filled 2024-04-13 – 2024-06-07 (×2): qty 180, 90d supply, fill #0

## 2024-04-13 MED ORDER — BISOPROLOL FUMARATE 5 MG PO TABS
5.0000 mg | ORAL_TABLET | Freq: Every day | ORAL | 3 refills | Status: AC
  Filled 2024-04-13: qty 90, 90d supply, fill #0

## 2024-04-15 ENCOUNTER — Encounter: Payer: Self-pay | Admitting: Internal Medicine

## 2024-04-15 NOTE — Telephone Encounter (Signed)
 Report to Emergency Department/Urgent Care/call 911 for immediate medical evaluation. Follow-up with Primary Care.

## 2024-05-10 ENCOUNTER — Other Ambulatory Visit (HOSPITAL_COMMUNITY): Payer: Self-pay

## 2024-05-18 ENCOUNTER — Other Ambulatory Visit (HOSPITAL_COMMUNITY): Payer: Self-pay

## 2024-05-18 ENCOUNTER — Encounter (HOSPITAL_COMMUNITY): Payer: Self-pay

## 2024-05-18 MED ORDER — MELOXICAM 15 MG PO TABS
15.0000 mg | ORAL_TABLET | Freq: Every day | ORAL | 0 refills | Status: AC
  Filled 2024-05-18: qty 30, 30d supply, fill #0

## 2024-06-02 ENCOUNTER — Encounter: Payer: Self-pay | Admitting: Internal Medicine

## 2024-06-07 ENCOUNTER — Other Ambulatory Visit (HOSPITAL_COMMUNITY): Payer: Self-pay

## 2024-06-07 ENCOUNTER — Other Ambulatory Visit: Payer: Self-pay

## 2024-06-07 NOTE — Telephone Encounter (Signed)
 Scleral/conjunctival hemorrhage is generally benign and does not have a strong association with any cardiovascular condition. It is more frequent with HTN and DM or when taking anticoagulants, but I do not think any of those conditions apply to her. They often occur after sneezing, coughing, straining. If there is any visual acuity abnormality she should see an ophthalmologist.

## 2024-06-08 ENCOUNTER — Other Ambulatory Visit (HOSPITAL_COMMUNITY): Payer: Self-pay

## 2024-06-09 ENCOUNTER — Other Ambulatory Visit (HOSPITAL_COMMUNITY): Payer: Self-pay

## 2024-06-20 ENCOUNTER — Ambulatory Visit: Admitting: Adult Health
